# Patient Record
Sex: Female | Born: 1965
Health system: Southern US, Community
[De-identification: ages and names within clinical notes are randomized; demographics above are authoritative.]

## PROBLEM LIST (undated history)

## (undated) DIAGNOSIS — E669 Obesity, unspecified: Secondary | ICD-10-CM

## (undated) DIAGNOSIS — N809 Endometriosis, unspecified: Secondary | ICD-10-CM

## (undated) DIAGNOSIS — R040 Epistaxis: Secondary | ICD-10-CM

## (undated) DIAGNOSIS — D649 Anemia, unspecified: Secondary | ICD-10-CM

## (undated) DIAGNOSIS — J40 Bronchitis, not specified as acute or chronic: Secondary | ICD-10-CM

## (undated) DIAGNOSIS — Z86718 Personal history of other venous thrombosis and embolism: Secondary | ICD-10-CM

## (undated) DIAGNOSIS — M1990 Unspecified osteoarthritis, unspecified site: Secondary | ICD-10-CM

## (undated) DIAGNOSIS — D259 Leiomyoma of uterus, unspecified: Secondary | ICD-10-CM

## (undated) DIAGNOSIS — F32A Depression, unspecified: Secondary | ICD-10-CM

## (undated) DIAGNOSIS — I499 Cardiac arrhythmia, unspecified: Secondary | ICD-10-CM

## (undated) DIAGNOSIS — F329 Major depressive disorder, single episode, unspecified: Secondary | ICD-10-CM

## (undated) DIAGNOSIS — G4730 Sleep apnea, unspecified: Secondary | ICD-10-CM

## (undated) DIAGNOSIS — G4700 Insomnia, unspecified: Secondary | ICD-10-CM

## (undated) DIAGNOSIS — R600 Localized edema: Secondary | ICD-10-CM

## (undated) DIAGNOSIS — I4891 Unspecified atrial fibrillation: Secondary | ICD-10-CM

## (undated) DIAGNOSIS — E559 Vitamin D deficiency, unspecified: Secondary | ICD-10-CM

## (undated) DIAGNOSIS — K219 Gastro-esophageal reflux disease without esophagitis: Secondary | ICD-10-CM

## (undated) DIAGNOSIS — I10 Essential (primary) hypertension: Secondary | ICD-10-CM

## (undated) DIAGNOSIS — R0981 Nasal congestion: Secondary | ICD-10-CM

## (undated) DIAGNOSIS — F419 Anxiety disorder, unspecified: Secondary | ICD-10-CM

## (undated) HISTORY — DX: Anxiety disorder, unspecified: F41.9

## (undated) HISTORY — DX: Vitamin D deficiency, unspecified: E55.9

## (undated) HISTORY — DX: Insomnia, unspecified: G47.00

## (undated) HISTORY — DX: Localized edema: R60.0

## (undated) HISTORY — DX: Nasal congestion: R09.81

## (undated) HISTORY — PX: MOUTH SURGERY: SHX715

## (undated) HISTORY — DX: Personal history of other venous thrombosis and embolism: Z86.718

## (undated) HISTORY — DX: Depression, unspecified: F32.A

## (undated) HISTORY — PX: ABDOMINAL HYSTERECTOMY: SHX81

## (undated) HISTORY — DX: Endometriosis, unspecified: N80.9

## (undated) HISTORY — PX: BREAST BIOPSY: SHX20

## (undated) HISTORY — DX: Epistaxis: R04.0

## (undated) HISTORY — PX: LAPAROSCOPY: SHX197

## (undated) HISTORY — DX: Unspecified atrial fibrillation: I48.91

## (undated) HISTORY — DX: Sleep apnea, unspecified: G47.30

---
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
## 2005-05-27 ENCOUNTER — Ambulatory Visit: Payer: Self-pay | Admitting: Internal Medicine

## 2005-10-18 ENCOUNTER — Emergency Department (HOSPITAL_COMMUNITY): Admission: EM | Admit: 2005-10-18 | Discharge: 2005-10-19 | Payer: Self-pay | Admitting: Emergency Medicine

## 2005-10-21 ENCOUNTER — Encounter: Admission: RE | Admit: 2005-10-21 | Discharge: 2005-10-21 | Payer: Self-pay | Admitting: Internal Medicine

## 2005-11-19 ENCOUNTER — Other Ambulatory Visit: Admission: RE | Admit: 2005-11-19 | Discharge: 2005-11-19 | Payer: Self-pay | Admitting: Obstetrics & Gynecology

## 2005-11-27 ENCOUNTER — Ambulatory Visit (HOSPITAL_COMMUNITY): Admission: RE | Admit: 2005-11-27 | Discharge: 2005-11-27 | Payer: Self-pay | Admitting: Obstetrics & Gynecology

## 2005-12-10 ENCOUNTER — Other Ambulatory Visit: Admission: RE | Admit: 2005-12-10 | Discharge: 2005-12-10 | Payer: Self-pay | Admitting: Obstetrics & Gynecology

## 2005-12-20 ENCOUNTER — Inpatient Hospital Stay (HOSPITAL_COMMUNITY): Admission: AD | Admit: 2005-12-20 | Discharge: 2005-12-20 | Payer: Self-pay | Admitting: Obstetrics & Gynecology

## 2006-01-18 ENCOUNTER — Ambulatory Visit (HOSPITAL_BASED_OUTPATIENT_CLINIC_OR_DEPARTMENT_OTHER): Admission: RE | Admit: 2006-01-18 | Discharge: 2006-01-18 | Payer: Self-pay | Admitting: Obstetrics & Gynecology

## 2006-02-07 DIAGNOSIS — N8003 Adenomyosis of the uterus: Secondary | ICD-10-CM

## 2006-02-07 DIAGNOSIS — N800 Endometriosis of uterus: Secondary | ICD-10-CM

## 2006-02-07 HISTORY — DX: Adenomyosis of the uterus: N80.03

## 2006-02-07 HISTORY — DX: Endometriosis of uterus: N80.0

## 2006-03-02 ENCOUNTER — Encounter (INDEPENDENT_AMBULATORY_CARE_PROVIDER_SITE_OTHER): Payer: Self-pay | Admitting: *Deleted

## 2006-03-02 ENCOUNTER — Inpatient Hospital Stay (HOSPITAL_COMMUNITY): Admission: RE | Admit: 2006-03-02 | Discharge: 2006-03-04 | Payer: Self-pay | Admitting: Obstetrics & Gynecology

## 2006-04-01 ENCOUNTER — Encounter: Admission: RE | Admit: 2006-04-01 | Discharge: 2006-04-01 | Payer: Self-pay | Admitting: Internal Medicine

## 2006-10-21 ENCOUNTER — Ambulatory Visit: Payer: Self-pay | Admitting: Family Medicine

## 2006-10-28 ENCOUNTER — Ambulatory Visit: Payer: Self-pay | Admitting: Family Medicine

## 2006-11-18 ENCOUNTER — Ambulatory Visit: Payer: Self-pay | Admitting: Family Medicine

## 2007-01-04 ENCOUNTER — Encounter: Admission: RE | Admit: 2007-01-04 | Discharge: 2007-01-04 | Payer: Self-pay | Admitting: Internal Medicine

## 2007-01-07 ENCOUNTER — Ambulatory Visit (HOSPITAL_COMMUNITY): Admission: RE | Admit: 2007-01-07 | Discharge: 2007-01-07 | Payer: Self-pay | Admitting: Obstetrics & Gynecology

## 2008-02-02 ENCOUNTER — Ambulatory Visit (HOSPITAL_COMMUNITY): Admission: RE | Admit: 2008-02-02 | Discharge: 2008-02-02 | Payer: Self-pay | Admitting: Obstetrics & Gynecology

## 2008-08-10 HISTORY — PX: LAPAROSCOPIC GASTRIC BANDING: SHX1100

## 2008-11-27 ENCOUNTER — Ambulatory Visit (HOSPITAL_COMMUNITY): Admission: RE | Admit: 2008-11-27 | Discharge: 2008-11-27 | Payer: Self-pay | Admitting: Surgery

## 2008-12-06 ENCOUNTER — Encounter: Admission: RE | Admit: 2008-12-06 | Discharge: 2009-02-07 | Payer: Self-pay | Admitting: Surgery

## 2008-12-07 ENCOUNTER — Ambulatory Visit (HOSPITAL_COMMUNITY): Admission: RE | Admit: 2008-12-07 | Discharge: 2008-12-07 | Payer: Self-pay | Admitting: Surgery

## 2009-02-15 ENCOUNTER — Ambulatory Visit (HOSPITAL_COMMUNITY): Admission: RE | Admit: 2009-02-15 | Discharge: 2009-02-15 | Payer: Self-pay | Admitting: Obstetrics & Gynecology

## 2009-02-18 ENCOUNTER — Ambulatory Visit (HOSPITAL_COMMUNITY): Admission: RE | Admit: 2009-02-18 | Discharge: 2009-02-19 | Payer: Self-pay | Admitting: Surgery

## 2009-03-04 ENCOUNTER — Encounter: Admission: RE | Admit: 2009-03-04 | Discharge: 2009-06-02 | Payer: Self-pay | Admitting: Surgery

## 2009-08-10 HISTORY — PX: LAPAROSCOPIC GASTRIC BANDING: SHX1100

## 2009-09-11 ENCOUNTER — Encounter: Admission: RE | Admit: 2009-09-11 | Discharge: 2009-09-11 | Payer: Self-pay | Admitting: Surgery

## 2009-11-11 DIAGNOSIS — I10 Essential (primary) hypertension: Secondary | ICD-10-CM | POA: Insufficient documentation

## 2010-02-21 ENCOUNTER — Ambulatory Visit (HOSPITAL_COMMUNITY): Admission: RE | Admit: 2010-02-21 | Discharge: 2010-02-21 | Payer: Self-pay | Admitting: Obstetrics & Gynecology

## 2010-03-11 ENCOUNTER — Encounter
Admission: RE | Admit: 2010-03-11 | Discharge: 2010-05-09 | Payer: Self-pay | Source: Home / Self Care | Admitting: Surgery

## 2010-08-31 ENCOUNTER — Encounter: Payer: Self-pay | Admitting: Gastroenterology

## 2010-11-16 LAB — CBC
HCT: 36.9 % (ref 36.0–46.0)
Hemoglobin: 11.3 g/dL — ABNORMAL LOW (ref 12.0–15.0)
Hemoglobin: 12.5 g/dL (ref 12.0–15.0)
MCHC: 33.5 g/dL (ref 30.0–36.0)
MCV: 88.2 fL (ref 78.0–100.0)
MCV: 88.5 fL (ref 78.0–100.0)
Platelets: 297 10*3/uL (ref 150–400)
RDW: 13.6 % (ref 11.5–15.5)
WBC: 10.8 10*3/uL — ABNORMAL HIGH (ref 4.0–10.5)

## 2010-11-16 LAB — COMPREHENSIVE METABOLIC PANEL
ALT: 20 U/L (ref 0–35)
AST: 20 U/L (ref 0–37)
Albumin: 3.7 g/dL (ref 3.5–5.2)
Alkaline Phosphatase: 63 U/L (ref 39–117)
Calcium: 9 mg/dL (ref 8.4–10.5)
Chloride: 108 mEq/L (ref 96–112)
GFR calc Af Amer: >60 mL/min (ref >60)
Potassium: 3.9 mEq/L (ref 3.5–5.1)
Sodium: 140 mEq/L (ref 135–145)
Total Bilirubin: 0.7 mg/dL (ref 0.3–1.2)

## 2010-11-16 LAB — DIFFERENTIAL
Basophils Absolute: 0 10*3/uL (ref 0.0–0.1)
Eosinophils Absolute: 0 10*3/uL (ref 0.0–0.7)
Lymphocytes Relative: 13 % (ref 12–46)
Lymphs Abs: 1.4 10*3/uL (ref 0.7–4.0)
Neutro Abs: 3.3 10*3/uL (ref 1.7–7.7)
Neutro Abs: 8.8 10*3/uL — ABNORMAL HIGH (ref 1.7–7.7)
Neutrophils Relative %: 67 % (ref 43–77)
Neutrophils Relative %: 81 % — ABNORMAL HIGH (ref 43–77)

## 2010-12-23 NOTE — Op Note (Signed)
NAMEED, RAYSON NO.:  [id number]   MEDICAL RECORD NO.:  [id number]          PATIENT TYPE:  OIB   LOCATION:  1530                         FACILITY:  [facility]   PHYSICIAN:  Sandria Bales. Ezzard Standing, M.D.  DATE OF BIRTH:  09-28-1965   DATE OF PROCEDURE:  02/18/2009  DATE OF DISCHARGE:  02/15/2009                               OPERATIVE REPORT   Date of Surgery - July 2010   PREOPERATIVE DIAGNOSES:  Morbid obesity, weight 312 pounds, body mass  index 46.2.   POSTOPERATIVE DIAGNOSES:  Morbid obesity (weight 312, body mass index  46.2).   PROCEDURE:  Laparoscopic band with AP standard band, subcutaneous port  placement.   INDICATIONS FOR PROCEDURE:  Ms. Biel is a 45 year old black female who  has completed our preop bariatric program and is interested in a lap  band placement.  She actually saw Dr. Daphine Deutscher in the preop visit because  I was out of town, but she understands well the indications and  potential complications of lap band surgery.   The potential complications include, but are not limited to bleeding,  infection, bowel injury, slippage or erosion of the band and long-term  nutritional consequences.   DESCRIPTION OF PROCEDURE:  The patient placed in a supine position and  her abdomen was prepped with ChloraPrep and sterilely draped.  She was  given a gram of Ancef at the initiation of the procedure and had PAS  stockings in place.  A timeout was held identifying the patient and the  procedure.   The abdomen was actually seated left upper quadrant.  I placed a 12 mm  Optiview to get into the abdomen.  I then placed four additional  trocars, a 5 mm subxiphoid trocar, a 15 mm right subcostal, an 11 mm  right paramedian and a 11 mm left paramedian.  In entering the abdomen,  I did go through some omentum.  I re-inspected this.  There was omentum  stuck up along her left upper quadrant.  I took some of this down and  inspected it.  I saw no obvious bowel  injury or any evidence of  bleeding.  I then proceeded with the operation.  I placed a Nathanson  retractor under the left lobe of the liver, retracted and found actually  a fairly thin upper abdomen.   Anesthesia passed the sizing tube, blew it up with 15 mL and checked for  a hiatal hernia and there was none.   I then dissected and opened up a window behind the gastrohepatic  ligament, identifying the left crus and opened it.  I opened a window  along the right crus and passed the finger dissector in a retrogastric  position.  I used an AP standard band that I placed into the abdomen and  then pulled this around the esophagogastric junction and cinched it down  over the sizing tube.   After pulling the band around the gastroesophageal junction, I cinched  it down over the sizing tube and removed the sizing tube.  I then used  three imbricating sutures of 0 Ethibond  suture bringing the stomach over  the band to do a gastrogastric plication.   I did take photos of the band which looked in good position without any  bleeding or problems.   I brought out the sizing tubing through the right paramedian incision  and attached it to a port.  I removed the Nathanson retractor under  direct visualization.  I re-inspected the left upper quadrant where the  bleeding had started at the beginning of the case and there was no  further.  I removed the trocars in turn.   I created a pocket where I placed the port subcutaneously in the right  upper quadrant mediolateral to incision.  I tried to make sure this lay  as flat as possible.  I closed the subcutaneous tissues with 3-0 Vicryl  suture.  I closed the skin with a 5-0 Vicryl suture, painted the wounds  with a tincture of benzoin and Steri-Stripped it.   The patient tolerated the procedure well and was transported to the  recovery room in good condition.  I did use about 20 mL of quarter-  percent Marcaine.  At the end of the procedure,  sponge and needle counts  were correct.      Sandria Bales. Ezzard Standing, M.D.  Electronically Signed     DHN/MEDQ  D:  02/18/2009  T:  02/19/2009  Job:  [id number]   cc:   Maryelizabeth Rowan, M.D.  Fax: [phone]   M. Leda Quail, MD  Fax: [phone]

## 2010-12-26 NOTE — Discharge Summary (Signed)
NAMELUANNE, KRZYZANOWSKI NO.:  [id number]   MEDICAL RECORD NO.:  [id number]          PATIENT TYPE:  INP   LOCATION:  1618                         FACILITY:  [facility]   PHYSICIAN:  M. Leda Quail, MD  DATE OF BIRTH:  02-07-1966   DATE OF ADMISSION:  03/02/2006  DATE OF DISCHARGE:  03/04/2006                                 DISCHARGE SUMMARY   ADMISSION DIAGNOSES:  2. A 45 year old G-1, P-1, African-American female with menorrhagia.  2. Iron deficiency anemia.  3. Failed HTA.  4. Uterine fibroids.   DISCHARGE DIAGNOSES:  43. A 45 year old G-1, P-1, African-American female with menorrhagia.  2. Iron deficiency anemia.  3. Failed HTA (hydrothermal ablation).  4. Uterine fibroids.   PROCEDURE:  Total abdominal hysterectomy.   HISTORY OF PRESENT ILLNESS:  There is a written H&P in the chart but brief,  Mrs. Cuthbert is a 45 year old, G-1, P-1, African-American female, who has a  history of menorrhagia and iron deficiency anemia.  She had C-section for  delivery of her first child.  She was seen as a new patient earlier this  year.  We discussed her bleeding.  Workup included an ultrasound, which did  show some uterine fibroids.  She does have regular cycles, they are just  very heavy and clotty.  Initially we decided to proceed with a hydrothermal  ablation for treatment but during the procedure poor distention of the  uterine cavity was achieved and procedure was terminated because I did not  feel that she would get a good ablation.  At that time, we talked about  using progesterone only pills or Mirena IUD versus definitive treatment with  hysterectomy and she opted for definitive treatment.  The rest of H&P is in  her chart.   HOSPITAL COURSE:  The patient was admitted through Same-Day Surgery and  taken to the operating room, where a total abdominal hysterectomy was  performed.  She had approximately 350 mL of blood loss.  She made 150 mL of  clear urine during  the procedure.  The patient did well during the procedure  and was taken to the recovery room and from there to the 6th floor for  postop recovery.  The evening of postoperative day 0, she was having some  nausea but she had good pain control.  Vital signs were stable.  She was  afebrile with blood pressure slightly elevated, as she did not take her  blood pressure medicine that day according to Anesthesia because it had a  diuretic in it.  I did draw a postop hemoglobin in the evening of  postoperative day 0 because her preop was 10.7.  This was 9.8.  she did have  some bowel sounds and the dressing over the incision was dry.  The morning  of postoperative day 1, she was complaining that the morphine PCA was making  her nauseated although otherwise she was doing well.  T-max was 98.1.  Vital  signs were stable.  Blood pressures ranged from 130 to 154/74 to 103.  She  made 2200 mL of urine output.  Repeat postop hemoglobin was 9.4 and her  electrolytes were within normal limits.  She had good bowel sounds.  The  incision was clean, dry and intact.  Perineum was dry and her lower  extremities were without any edema.  She did have SCDs in place.  Her Foley  catheter was removed.  Diet was advanced to regular diet.  She was  encouraged to ambulate.  Once she was tolerating regular diet well,  her IV  was Hep-locked.  The PCA was discontinued.  All IV medications were  discontinued.  The evening of postoperative day 1, she tolerated the diet  well and was voiding well.  She was passing flatus.  She also was tolerating  her pain medicines by mouth.  By postoperative day 2, she was doing well.  She was having some gas pains but passing flatus, otherwise vital signs were  stable.  She was afebrile.  Her blood pressures were much better control as  she had started back on her Benicar.  Her physical exam was completely  benign.  I removed her On-Q pump which was used for local anesthesia in the   immediate postoperative period.  The On-Q tubing sites had Steri-Strips  placed.  She did very well through her hospital course and at this point  discharge is felt appropriate.  She will be discharged home with family  later today.   DISCHARGE INSTRUCTIONS:  Provided in the written and verbal form.  She has  postop appointment with me on July 31 at 2:15 in the afternoon to check her  incision.  She was given her postoperative pain medication prescriptions at  her preop visit and this includes Percocet and Motrin and she will continue  on her 3 other preoperative medications, which include Aciphex, Zoloft and  Benicar.      Lum Keas, MD  Electronically Signed     MSM/MEDQ  D:  03/04/2006  T:  03/04/2006  Job:  [id number]

## 2010-12-26 NOTE — Op Note (Signed)
[name], [name]                ACCOUNT NO.:  [id number]   MEDICAL RECORD NO.:  [id number]          PATIENT TYPE:  AMB   LOCATION:  DAY                          FACILITY:  [facility]   PHYSICIAN:  M. Leda Quail, MD  DATE OF BIRTH:  1966/01/17   DATE OF PROCEDURE:  03/02/2006  DATE OF DISCHARGE:                                 OPERATIVE REPORT   PREOPERATIVE DIAGNOSES:  68. 45 year old G1, P81 African-American female with menorrhagia.  2. Fibroids.  3. Iron-deficiency anemia.  4. Hypertension.  5. History of cesarean section x1.  6. Reflux disease.  7. Obesity.   POSTOPERATIVE DIAGNOSES:  54. 45 year old G1, P79 African-American female with menorrhagia.  2. Fibroids.  3. Iron-deficiency anemia.  4. Hypertension.  5. History of cesarean section x1.  6. Reflux disease.  7. Obesity.   PROCEDURE:  Total abdominal hysterectomy.   SURGEON:  M. Leda Quail, MD   ASSISTANT:  Edwena Felty. Romine, M.D.   ANESTHESIA:  General endotracheal.   SPECIMENS:  Uterus and cervix to pathology.   ESTIMATED BLOOD LOSS:  350 cc.   URINE OUTPUT:  150 cc of clear urine draining in the Foley catheter.   FLUIDS:  4000 cc of LR and 500 cc of Hespan.   COMPLICATIONS:  None.   INDICATIONS:  [name] is a 45 year old G1, P69 African-American female  with a history of iron-deficiency anemia resulting from menorrhagia.  She  does have fibroids on ultrasound, and she has had a negative endometrial  biopsy.  Patient opted for an HTA earlier this year for treatment of her  menorrhagia; however, during that surgery, she was noted to have a very  boggy and heavy uterus, and it was very difficult to maintain adequate  distention, despite having a good seal around the HTA apparatus.  Her HTA  was aborted, as I did not feel she would get an appropriate treatment with  that outpatient procedure.  She and I discussed other options, which could  include progesterone-only pills, continuous progestin, a  Mirena IUD, but the  patient opted for a hysterectomy.  She presents for this today.   PROCEDURE:  Patient taken to the operating room.  She was placed in the  supine position.  General endotracheal anesthesia administered by the  anesthesiologist without difficulty.  The legs were positioned in the frog  leg position.  The abdomen, perineum, inner thighs, and vagina were prepped  in a normal sterile fashion.  A Foley catheter was inserted under sterile  conditions, and the patient was draped after her legs were straightened.   Using a knife, a Pfannenstiel skin incision was made and carried down  through the subcutaneous fat and tissue.  The fascia was identified and  extended midline.  The fascial incision was extended laterally using curved  Mayo scissors.   Kocher clamps were applied to the inferior aspect of fascia incision.  Underlying rectus muscles were dissected off sharply.  This was done  superiorly and inferiorly.  Then the rectus muscles were divided sharply.  The Peritoneum was identified and incised sharply.  The peritoneal  incision  was extended superiorly and then inferiorly with caution, noting the  location of the bladder.  At this point, the pelvis was surveyed.  No  abnormalities were noted.  There was a retroflexed uterus.  The ovaries and  tubes appeared normal.  The liver edge, gallbladder, and stomach edge felt  normal.   A Balfour retractor was placed intra-abdominally.  The bladder blade was  detached.  Care was taken to make sure that the bowel was not under the  blades of the Balfour retractor and that the Balfour retractor was not  sitting on the psoas muscles.  Then the bowel was packed anteriorly with  three moistened laparotomy sponges.  An upper blade was then attached to the  Balfour retractor and a malleable retractor was attached, just to insure  that the bowel packing stayed superiorly during the procedure.   Long Kelly clamps were placed along  the uterine ovarian pedicles.  Round  ligaments were suture-ligated bilaterally and incised.  The anterior leaf of  the broad ligament was opened to the level of the internal os.  This was  done bilaterally.  Then the utero-ovarian arteries were isolated  bilaterally.  Clamped and curved Heaney clamps transected and suture-ligated  first with free tie of 0 Vicryl and then a stitch tie.  These pedicles were  hemostatic.  The uterine arteries were isolated bilaterally and clamped with  curved Heaney clamps.  The pedicles were transected and tied twice with 0  Vicryl.  Then attention was turned to the bladder flap.  The bladder was  dissected off of the cervix.  There was scar tissue and some vascularity  where the previous C-section scar was.  Once this was incised sharply, the  bladder could be advanced down the cervix without difficulty using sponge  stick.  At this point, the cardinal ligaments were serially transected with  clamps, transected, and suture ligated with 0 Vicryl.  This was done down  the side of the cervix.  Care was taken to insure that the bladder was below  the level of the pedicle.  Then at the base of the cervix, curved Heaney  clamps were placed at the corner.  The pedicle was transected, and a Heaney  stitch of 0 Vicryl was placed.  This was left long and held onto.  Then  using Jorgenson scissors, the vaginal mucosa was incised circumferentially  around the cervix, and the cervix was freed from the vagina.  The cervix and  uterus were handed off to be sent to pathology.  The vaginal cuff angle was  stitched with a 0 Vicryl and transfixed to the ipsilateral uterosacral  ligament.  The remainder of the cuff was closed with figure-of-eight sutures  of 0 Vicryl.   At this point, the pelvis was irrigated with copious amounts of warm normal  saline.  Round ligaments were hemostatic.  The uterine ovarian pedicles were hemostatic; however, there was some bleeding on the  bladder.  Two  superficial stitches of 2-0 Vicryl were used to obtain hemostasis.  Because  there was a significant amount of vascularity present as the bladder flap  was being created, Gelfoam was placed underneath the bladder and on top of  the vaginal cuff.  The bladder dissection was at least another 1 cm down the  cervix from the vaginal cuff.  There was also some bleeding at the  peritoneal edge along the posterior aspect of the vaginal cuff.  This was  suture ligated  with 0 Vicryl and stitched to the vaginal cuff.  Hemostasis  was noted here.  At this point, hemostasis was present.  The bladder blade,  upper bowel blade, as well as the malleable retractor and the three moist  laparotomy sponges were removed.  Then the Balfour retractor was removed  without difficulty.  The bowel and omentum were placed back in normal  anatomic position.  The peritoneum was closed with a running stitch of 0  Vicryl.  Then the OnCue pump on the patient's right side was placed  subfascially.  It had been primed with 1% lidocaine.  The subfascial tissue  and rectus muscles were visualized.  No bleeding was noted.  The fascia was  then closed from the corners and run to the midline.  The second  subcutaneous OnCue pump tubing was placed on the patient's left side.  Subcutaneous fat and tissues were irrigated.  Excellent hemostasis was  noted.  Then the skin was closed with a subcuticular stitch of 3-0 Vicryl.  The incision was cleansed with Benzoin.  Steri-Strips were applied.  The  OnCue pump tubing was held in place using Steri-Strips, then dressing was  applied.   Sponge, lap, needle, and instrument counts were correct x2.  The patient  tolerated the procedure well.  She is awakened from general endotracheal  anesthesia without difficulty and taken to the recovery room in stable  condition.      Lum Keas, MD  Electronically Signed     MSM/MEDQ  D:  03/02/2006  T:  03/02/2006  Job:   [id number]

## 2010-12-26 NOTE — Op Note (Signed)
[name], [name]                ACCOUNT NO.:  [id number]   MEDICAL RECORD NO.:  [id number]          PATIENT TYPE:  AMB   LOCATION:  NESC                         FACILITY:  [facility]   PHYSICIAN:  M. Leda Quail, MD  DATE OF BIRTH:  1966-03-26   DATE OF PROCEDURE:  01/18/2006  DATE OF DISCHARGE:                                 OPERATIVE REPORT   PREOPERATIVE DIAGNOSIS:  A 45 year old G2, P1-A1, African-American female  with menorrhagia and uterine fibroids.   POSTOPERATIVE DIAGNOSIS:  A 45 year old G72, P79-A1, African-American female  with menorrhagia and uterine fibroids.   OPERATION PERFORMED:  Hysteroscopy.   SURGEON:  M. Leda Quail, MD   ASSISTANT:  None.   ANESTHESIA:  General endotracheal.  Dr. Leta Jungling, Anesthesiologist.   URINE OUTPUT:  50 mL.   DRAINS:  Bonanno catheter at end of procedure.   FLUIDS REPLACED:  1500 mL lactated Ringers.   ESTIMATED BLOOD LOSS:  Minimal.   SPECIMENS:  None.   INDICATIONS FOR PROCEDURE:  [name] is a 45 year old G2, P1-A1 African-  American female with history of menorrhagia.  Uterine fibroids have been  diagnosed with ultrasound.  Uterus is not grossly enlarged.  It measured 7.8  x 4.4 x 5.7 cm and had several small fibroids measuring out 1.4 cm and  approximately 1 cm in size.  she does have fairly retroflexed uterus.  An  endometrial biopsy was done preop which showed proliferative endometrium.  She was also pretreated with Depo-Lupron to improve the outcome of surgery.   COMPLICATIONS:  Due to poor distention of the endometrial cavity with the  hysteroscopy, the fundus of the cavity could not be well-distended and I did  not feel that it was appropriate to proceed with the ablation, as I do not  feel that she would have a good result from the surgery.  As the tubal  occlusion was also being done due to the HTA, this was not attempted as  well.   DESCRIPTION OF PROCEDURE:  The patient was taken to the operating room.   She  was placed in supine position.  Running IV was in place.  Informed consent  ws present on the chart.  General endotracheal anesthesia was administered  by anesthesia staff without difficulty.  Her legs were positioned in the  dorsal lithotomy position in [place] stirrups.  The abdomen and perineum were  entered.  Thighs and vagina were prepped and draped in the normal sterile  fashion.  A weighted speculum was placed in the vagina.  Cervix was  visualized.  Anterior lip of the cervix was grasped using a single toothed  tenaculum.  The uterus sounded to 9 mm which was inconsistent with uterus  measurements.  On ultrasound it appeared to be only about 7.8 cm.  Then  using Pratt dilators, the uterus was dilated up using Pratt dilators  starting with a #11 and dilating up to a #24.  Then a 7 mm hysteroscope was  placed through the endocervical canal and advanced toward the fundus.  The  uterus was retroflexed and a significant amount of pull  was placed on the  cervix to place the hysteroscope into the endometrial cavity.  The patient's  left tubal ostia was visualized.  There did appear to be a filmy covering  over it.  The right tubal ostia was not ever clearly identified.  The fundus  of the uterus kept collapsing and the right corner of the uterus was  visualized but not the ostia.  At this point the decision was made not to  proceed with the Assure and to go ahead and proceed with the HTA.  As the  hysteroscope was removed to the level right past the internal os where the  scope would sit for the endometrial ablation.  Fundus of the uterus  continued to collapse even with pressure applied to the distention medial  fluid.  Good distention of the fundus was not present and the fundus  continued to collapse on itself.  There was no deficit with the fluid at  this point and there was no evidence of a uterine perforation other than the  endometrial cavity sounded longer than it measured on  ultrasound.  I did not  feel comfortable proceeding with the ablation with the fundus continuing  upon itself and at this point the entire procedure was aborted.  The  hysteroscope was removed and the single toothed tenaculum was removed from  the cervix.  There was some bleeding noted on the anterior lip of the  cervix.  A silver nitrate stick was used to obtain excellent hemostasis.  The patient ultimately tolerated the procedure well.  Sponge, lap, needle  and instruments were correct times two.  Her legs were positioned back in  the supine position and she was awakened from general endotracheal  anesthesia without difficulty.      Lum Keas, MD  Electronically Signed     MSM/MEDQ  D:  01/18/2006  T:  01/18/2006  Job:  [id number]

## 2011-01-26 ENCOUNTER — Other Ambulatory Visit: Payer: Self-pay | Admitting: Obstetrics & Gynecology

## 2011-01-26 DIAGNOSIS — Z1231 Encounter for screening mammogram for malignant neoplasm of breast: Secondary | ICD-10-CM

## 2011-01-27 ENCOUNTER — Encounter (INDEPENDENT_AMBULATORY_CARE_PROVIDER_SITE_OTHER): Payer: Self-pay | Admitting: Surgery

## 2011-02-23 ENCOUNTER — Ambulatory Visit (HOSPITAL_COMMUNITY)
Admission: RE | Admit: 2011-02-23 | Discharge: 2011-02-23 | Disposition: A | Discharge status: Home / Self Care | Payer: BC Managed Care – PPO | Source: Ambulatory Visit | Attending: Obstetrics & Gynecology | Admitting: Obstetrics & Gynecology

## 2011-02-23 DIAGNOSIS — Z1231 Encounter for screening mammogram for malignant neoplasm of breast: Secondary | ICD-10-CM | POA: Insufficient documentation

## 2011-07-27 ENCOUNTER — Encounter (INDEPENDENT_AMBULATORY_CARE_PROVIDER_SITE_OTHER): Payer: Self-pay | Admitting: Surgery

## 2011-09-03 DIAGNOSIS — F419 Anxiety disorder, unspecified: Secondary | ICD-10-CM | POA: Insufficient documentation

## 2011-09-14 ENCOUNTER — Encounter (HOSPITAL_COMMUNITY): Payer: Self-pay | Admitting: *Deleted

## 2011-09-14 ENCOUNTER — Emergency Department (HOSPITAL_COMMUNITY)
Admission: EM | Admit: 2011-09-14 | Discharge: 2011-09-14 | Disposition: A | Discharge status: Home / Self Care | Payer: BC Managed Care – PPO | Attending: Emergency Medicine | Admitting: Emergency Medicine

## 2011-09-14 ENCOUNTER — Emergency Department (HOSPITAL_COMMUNITY): Payer: BC Managed Care – PPO

## 2011-09-14 DIAGNOSIS — R1084 Generalized abdominal pain: Secondary | ICD-10-CM | POA: Insufficient documentation

## 2011-09-14 DIAGNOSIS — Z79899 Other long term (current) drug therapy: Secondary | ICD-10-CM | POA: Insufficient documentation

## 2011-09-14 DIAGNOSIS — R112 Nausea with vomiting, unspecified: Secondary | ICD-10-CM | POA: Insufficient documentation

## 2011-09-14 DIAGNOSIS — I10 Essential (primary) hypertension: Secondary | ICD-10-CM | POA: Insufficient documentation

## 2011-09-14 HISTORY — DX: Essential (primary) hypertension: I10

## 2011-09-14 HISTORY — DX: Anemia, unspecified: D64.9

## 2011-09-14 HISTORY — DX: Leiomyoma of uterus, unspecified: D25.9

## 2011-09-14 LAB — URINALYSIS, MICROSCOPIC ONLY
Ketones, ur: NEGATIVE mg/dL
Leukocytes, UA: NEGATIVE
Nitrite: NEGATIVE
Protein, ur: NEGATIVE mg/dL
Urobilinogen, UA: 0.2 mg/dL (ref 0.0–1.0)

## 2011-09-14 LAB — COMPREHENSIVE METABOLIC PANEL
ALT: 12 U/L (ref 0–35)
AST: 13 U/L (ref 0–37)
Albumin: 3.5 g/dL (ref 3.5–5.2)
CO2: 29 mEq/L (ref 19–32)
Calcium: 9.3 mg/dL (ref 8.4–10.5)
Creatinine, Ser: 0.83 mg/dL (ref 0.50–1.10)
Sodium: 137 mEq/L (ref 135–145)

## 2011-09-14 LAB — CBC
HCT: 36.9 % (ref 36.0–46.0)
MCHC: 33.3 g/dL (ref 30.0–36.0)
MCV: 88.9 fL (ref 78.0–100.0)
Platelets: 335 10*3/uL (ref 150–400)
RDW: 12.9 % (ref 11.5–15.5)
WBC: 5.9 10*3/uL (ref 4.0–10.5)

## 2011-09-14 LAB — DIFFERENTIAL
Basophils Absolute: 0.1 10*3/uL (ref 0.0–0.1)
Basophils Relative: 1 % (ref 0–1)
Eosinophils Relative: 1 % (ref 0–5)
Lymphocytes Relative: 22 % (ref 12–46)
Monocytes Absolute: 0.7 10*3/uL (ref 0.1–1.0)
Neutro Abs: 3.8 10*3/uL (ref 1.7–7.7)

## 2011-09-14 LAB — LIPASE, BLOOD: Lipase: 20 U/L (ref 11–59)

## 2011-09-14 MED ORDER — SODIUM CHLORIDE 0.9 % IV SOLN: INTRAVENOUS | Status: DC

## 2011-09-14 MED ORDER — SODIUM CHLORIDE 0.9 % IV SOLN
1000.0000 mL | INTRAVENOUS | Status: DC
  Administered 2011-09-14: 1000 mL via INTRAVENOUS

## 2011-09-14 MED ORDER — DICYCLOMINE HCL 10 MG/ML IM SOLN
20.0000 mg | Freq: Once | INTRAMUSCULAR | Status: AC
  Administered 2011-09-14: 20 mg via INTRAMUSCULAR
  Filled 2011-09-14: qty 2

## 2011-09-14 MED ORDER — PROMETHAZINE HCL 25 MG RE SUPP: 25.0000 mg | Freq: Four times a day (QID) | RECTAL | Status: AC | PRN

## 2011-09-14 MED ORDER — ONDANSETRON HCL 4 MG/2ML IJ SOLN
4.0000 mg | INTRAMUSCULAR | Status: DC | PRN
  Administered 2011-09-14: 4 mg via INTRAVENOUS
  Filled 2011-09-14: qty 2

## 2011-09-14 MED ORDER — FAMOTIDINE IN NACL 20-0.9 MG/50ML-% IV SOLN
20.0000 mg | Freq: Once | INTRAVENOUS | Status: AC
  Administered 2011-09-14: 20 mg via INTRAVENOUS
  Filled 2011-09-14: qty 50

## 2011-09-14 MED ORDER — ONDANSETRON HCL 4 MG/2ML IJ SOLN
4.0000 mg | Freq: Once | INTRAMUSCULAR | Status: AC
  Administered 2011-09-14: 4 mg via INTRAVENOUS
  Filled 2011-09-14: qty 2

## 2011-09-14 NOTE — ED Notes (Signed)
Pt vomitted x 1 and now c/o nausea. Dr. Clarene Duke notified. Awaiting further orders.

## 2011-09-14 NOTE — ED Notes (Signed)
Dr. McManus at bedside. 

## 2011-09-14 NOTE — ED Notes (Signed)
Pt provided saltine crackers and ginger ale per MD orders

## 2011-09-14 NOTE — ED Provider Notes (Signed)
History     CSN: [id number]  Arrival date & time 09/14/11  0706    Chief Complaint  Patient presents with  . Nausea    HPI Pt was seen at 0730.  Per pt, c/o gradual onset and persistence of multiple intermittent episodes of N/V since yesterday.  Has been assoc with generalized abd "cramping" pain.  Denies fevers, no rash, no back pain, no diarrhea, no CP/SOB, no cough, no black or blood in stools or emesis.    Past Medical History  Diagnosis Date  . Sinus congestion   . Bleeding from the nose   . Hypertension   . Anemia   . Uterine fibroid     Past Surgical History  Procedure Date  . Laparoscopic gastric banding   . Abdominal hysterectomy     partial    History  Substance Use Topics  . Smoking status: Never Smoker   . Smokeless tobacco: Not on file  . Alcohol Use: No   Review of Systems ROS: Statement: All systems negative except as marked or noted in the HPI; Constitutional: Negative for fever and chills. ; ; Eyes: Negative for eye pain, redness and discharge. ; ; ENMT: Negative for ear pain, hoarseness, nasal congestion, sinus pressure and sore throat. ; ; Cardiovascular: Negative for chest pain, palpitations, diaphoresis, dyspnea and peripheral edema. ; ; Respiratory: Negative for cough, wheezing and stridor. ; ; Gastrointestinal: +N/V, abd pain. Negative for diarrhea, blood in stool, hematemesis, jaundice and rectal bleeding. . ; ; Genitourinary: Negative for dysuria, flank pain and hematuria. ; ; Musculoskeletal: Negative for back pain and neck pain. Negative for swelling and trauma.; ; Skin: Negative for pruritus, rash, abrasions, blisters, bruising and skin lesion.; ; Neuro: Negative for headache, lightheadedness and neck stiffness. Negative for weakness, altered level of consciousness , altered mental status, extremity weakness, paresthesias, involuntary movement, seizure and syncope.     Allergies  Flagyl  Home Medications   Current Outpatient Rx  Name Route Sig  Dispense Refill  . ALPRAZOLAM 0.5 MG PO TABS Oral Take 0.5 mg by mouth 3 (three) times daily as needed.    Marland Kitchen ALUMINUM CHLORIDE 20 % EX SOLN Topical Apply 1 application topically daily.    Marland Kitchen AMLODIPINE BESYLATE 2.5 MG PO TABS Oral Take 2.5 mg by mouth daily.    . BUPROPION HCL 100 MG PO TABS Oral Take 100 mg by mouth 2 (two) times daily.    Marland Kitchen CETIRIZINE HCL 10 MG PO TABS Oral Take 10 mg by mouth daily.    Marland Kitchen VITAMIN D 1000 UNITS PO TABS Oral Take 1,000 Units by mouth daily.    Marland Kitchen FLUTICASONE PROPIONATE 50 MCG/ACT NA SUSP Nasal Place 2 sprays into the nose daily.    Marland Kitchen MONTELUKAST SODIUM 10 MG PO TABS Oral Take 10 mg by mouth at bedtime.      . ADULT MULTIVITAMIN W/MINERALS CH Oral Take 1 tablet by mouth daily.    Marland Kitchen OLMESARTAN MEDOXOMIL-HCTZ 40-25 MG PO TABS Oral Take 1 tablet by mouth daily.    Marland Kitchen VITAMIN B-1 PO Oral Take 1 capsule by mouth daily.        BP 138/80  Pulse 78  Temp(Src) 98.4 F (36.9 C) (Oral)  Resp 20  SpO2 100%  Physical Exam 0735: Physical examination:  Nursing notes reviewed; Vital signs and O2 SAT reviewed;  Constitutional: Well developed, Well nourished, Well hydrated, Uncomfortable appearing; Head:  Normocephalic, atraumatic; Eyes: EOMI, PERRL, No scleral icterus; ENMT: Mouth and pharynx normal,  Mucous membranes moist; Neck: Supple, Full range of motion, No lymphadenopathy; Cardiovascular: Regular rate and rhythm, No murmur, rub, or gallop; Respiratory: Breath sounds clear & equal bilaterally, No rales, rhonchi, wheezes, or rub, Normal respiratory effort/excursion; Chest: Nontender, Movement normal; Abdomen: Soft, Nontender, Nondistended, Normal bowel sounds; Extremities: Pulses normal, No tenderness, No edema, No calf edema or asymmetry.; Neuro: AA&Ox3, Major CN grossly intact.  No gross focal motor or sensory deficits in extremities.; Skin: Color normal, Warm, Dry, no rash.    ED Course  Procedures    MDM  MDM Reviewed: nursing note and vitals Interpretation: labs  and x-ray   Results for orders placed during the hospital encounter of 09/14/11  LIPASE, BLOOD      Component Value Range   Lipase 20  11 - 59 (U/L)  CBC      Component Value Range   WBC 5.9  4.0 - 10.5 (K/uL)   RBC 4.15  3.87 - 5.11 (MIL/uL)   Hemoglobin 12.3  12.0 - 15.0 (g/dL)   HCT 56.2  13.0 - 86.5 (%)   MCV 88.9  78.0 - 100.0 (fL)   MCH 29.6  26.0 - 34.0 (pg)   MCHC 33.3  30.0 - 36.0 (g/dL)   RDW 78.4  69.6 - 29.5 (%)   Platelets 335  150 - 400 (K/uL)  DIFFERENTIAL      Component Value Range   Neutrophils Relative 65  43 - 77 (%)   Neutro Abs 3.8  1.7 - 7.7 (K/uL)   Lymphocytes Relative 22  12 - 46 (%)   Lymphs Abs 1.3  0.7 - 4.0 (K/uL)   Monocytes Relative 12  3 - 12 (%)   Monocytes Absolute 0.7  0.1 - 1.0 (K/uL)   Eosinophils Relative 1  0 - 5 (%)   Eosinophils Absolute 0.1  0.0 - 0.7 (K/uL)   Basophils Relative 1  0 - 1 (%)   Basophils Absolute 0.1  0.0 - 0.1 (K/uL)  COMPREHENSIVE METABOLIC PANEL      Component Value Range   Sodium 137  135 - 145 (mEq/L)   Potassium 3.6  3.5 - 5.1 (mEq/L)   Chloride 102  96 - 112 (mEq/L)   CO2 29  19 - 32 (mEq/L)   Glucose, Bld 94  70 - 99 (mg/dL)   BUN 9  6 - 23 (mg/dL)   Creatinine, Ser 2.84  0.50 - 1.10 (mg/dL)   Calcium 9.3  8.4 - 13.2 (mg/dL)   Total Protein 6.8  6.0 - 8.3 (g/dL)   Albumin 3.5  3.5 - 5.2 (g/dL)   AST 13  0 - 37 (U/L)   ALT 12  0 - 35 (U/L)   Alkaline Phosphatase 63  39 - 117 (U/L)   Total Bilirubin 0.7  0.3 - 1.2 (mg/dL)   GFR calc non Af Amer 84 (*) >90 (mL/min)   GFR calc Af Amer >90  >90 (mL/min)    Dg Abd Acute W/chest 09/14/2011  *RADIOLOGY REPORT*  Clinical Data: Nausea and vomiting.  ACUTE ABDOMEN SERIES (ABDOMEN 2 VIEW & CHEST 1 VIEW)  Comparison: Abdominal films 02/19/2009.  Findings: The upright chest x-ray demonstrates no acute cardiopulmonary findings.  Two views of the abdomen demonstrate a normal bowel gas pattern. No findings for obstruction or perforation.  The lap band appears to be  normally positioned and oriented without complicating features.  The soft tissue shadows are maintained.  The bony structures are normal.  IMPRESSION:  1.  No acute  cardiopulmonary findings. 2.  No plain film findings for an acute abdominal process.  Original Report Authenticated By: P. Loralie Champagne, M.D.    11:50 AM:  Feels improved after meds.  Waiting for UA results.  Pt sitting up on stretcher, talking on cellphone and typing on computer.  Will trial PO.     1345:  Vomited after eating crackers.  Given another dose of IV zofran.  Able to tol PO fluids without vomiting.  Wants to go home now.  Dx testing d/w pt.  Questions answered.  Verb understanding, agreeable to d/c home with outpt f/u.    Naheem Mosco Allison Quarry, DO 09/16/11 1723

## 2011-09-14 NOTE — ED Notes (Signed)
Pt returned from XR to ER5.

## 2011-09-14 NOTE — ED Notes (Signed)
Pt reports taking 2 sips of ginger ale, no vomitting, but remains nauseated. Dr. Clarene Duke notified. Dr. Clarene Duke at bedside discussing plan of care with pt.

## 2011-09-14 NOTE — ED Notes (Signed)
Patient transported via stretcher to X-ray 

## 2011-09-14 NOTE — ED Notes (Signed)
Pt. Is unable to go the restroom at this time.

## 2011-09-14 NOTE — ED Notes (Signed)
Pt experiencing abdominal cramping and N/V x 1 day.  Pt states she has had no diarrhea and denies other symptoms.

## 2011-09-15 LAB — URINE CULTURE
Colony Count: 75000
Culture  Setup Time: 201302041422

## 2011-11-02 ENCOUNTER — Telehealth (INDEPENDENT_AMBULATORY_CARE_PROVIDER_SITE_OTHER): Payer: Self-pay | Admitting: General Surgery

## 2011-11-02 NOTE — Telephone Encounter (Signed)
Pt calling for appt.  Has lap band since July 2010 and states she has not been in to be seen in a long time.  No current problems or discomfort.  First available appt on template is in June.  She would like to get in sooner if possible.  Call her cell, please:  [phone].

## 2011-11-04 ENCOUNTER — Telehealth (INDEPENDENT_AMBULATORY_CARE_PROVIDER_SITE_OTHER): Payer: Self-pay

## 2011-11-04 NOTE — Telephone Encounter (Signed)
I returned the pt's call to try and schedule an appointment.  She was told that Mardelle Matte doesn't have any time until June.  I reviewed his and Dr Allene Pyo schedule and offered some time in April and May.  She is out of town a lot but we agreed on an appointment May 16th at 4:30pm.

## 2011-11-09 HISTORY — PX: NASAL SINUS SURGERY: SHX719

## 2011-11-11 DIAGNOSIS — Z9109 Other allergy status, other than to drugs and biological substances: Secondary | ICD-10-CM | POA: Insufficient documentation

## 2011-11-13 ENCOUNTER — Ambulatory Visit (INDEPENDENT_AMBULATORY_CARE_PROVIDER_SITE_OTHER): Payer: BC Managed Care – PPO | Admitting: Surgery

## 2011-11-13 ENCOUNTER — Encounter (INDEPENDENT_AMBULATORY_CARE_PROVIDER_SITE_OTHER): Payer: Self-pay | Admitting: Surgery

## 2011-11-13 VITALS — BP 126/80 | HR 92 | Temp 98.2°F | Resp 20 | Ht 69.0 in | Wt 253.0 lb

## 2011-11-13 DIAGNOSIS — Z9884 Bariatric surgery status: Secondary | ICD-10-CM

## 2011-11-13 NOTE — Progress Notes (Signed)
URGENT Office [name] 46 y.o.  Body mass index is 37.36 kg/(m^2).  Patient Active Problem List  Diagnoses  . Lapband APS July 2010    Allergies  Allergen Reactions  . Flagyl (Metronidazole Hcl) Swelling and Other (See Comments)    Hard to breath    Past Surgical History  Procedure Date  . Laparoscopic gastric banding 02/18/2009  . Abdominal hysterectomy     partial   DEWEY,ELIZABETH, MD, MD 1. Lapband APS July 2010     Ms. [name] hasn't been seen in our office is about a year.  In that time. She has gotten married and has travel a lot her work. Recently she has been having more issues with nocturnal reflux, coughing and wheezing. It sounds like she has developed some tightness issues. She has an appointment to see Dr. Ezzard Standing in a couple weeks. I elected to go ahead and remove 1.5 cc from her APS band asked her to followup with him. If that helped she may need to add a little bit back. So far she has lost about 70 pounds since her surgery. She looks good.  Plan followup with Dr. Ezzard Standing in the office at her pre-arranged schedule time. Matt B. Daphine Deutscher, MD, [facility] [phone] beeper [phone]  11/13/2011 5:45 PM

## 2011-11-13 NOTE — Progress Notes (Deleted)
URGENT Office SEFORA TIETJE 46 y.o.  Body mass index is 37.36 kg/(m^2).  There is no problem list on file for this patient.   Allergies  Allergen Reactions  . Flagyl (Metronidazole Hcl) Swelling and Other (See Comments)    Hard to breath    Past Surgical History  Procedure Date  . Laparoscopic gastric banding 02/18/2009  . Abdominal hysterectomy     partial   DEWEY,ELIZABETH, MD, MD No diagnosis found.  *** Matt B. Daphine Deutscher, MD, [facility] [phone] beeper [phone]  11/13/2011 5:43 PM

## 2011-11-14 ENCOUNTER — Emergency Department (HOSPITAL_COMMUNITY): Payer: BC Managed Care – PPO

## 2011-11-14 ENCOUNTER — Other Ambulatory Visit: Payer: Self-pay

## 2011-11-14 ENCOUNTER — Encounter (HOSPITAL_COMMUNITY): Payer: Self-pay | Admitting: Physical Medicine and Rehabilitation

## 2011-11-14 ENCOUNTER — Emergency Department (HOSPITAL_COMMUNITY)
Admission: EM | Admit: 2011-11-14 | Discharge: 2011-11-15 | Disposition: A | Discharge status: Home / Self Care | Payer: BC Managed Care – PPO | Attending: Emergency Medicine | Admitting: Emergency Medicine

## 2011-11-14 DIAGNOSIS — I10 Essential (primary) hypertension: Secondary | ICD-10-CM | POA: Insufficient documentation

## 2011-11-14 DIAGNOSIS — R0789 Other chest pain: Secondary | ICD-10-CM

## 2011-11-14 DIAGNOSIS — R071 Chest pain on breathing: Secondary | ICD-10-CM | POA: Insufficient documentation

## 2011-11-14 LAB — COMPREHENSIVE METABOLIC PANEL
Alkaline Phosphatase: 72 U/L (ref 39–117)
BUN: 10 mg/dL (ref 6–23)
Calcium: 10 mg/dL (ref 8.4–10.5)
Creatinine, Ser: 0.8 mg/dL (ref 0.50–1.10)
GFR calc Af Amer: >90 mL/min (ref >90)
Glucose, Bld: 93 mg/dL (ref 70–99)
Potassium: 3.4 mEq/L — ABNORMAL LOW (ref 3.5–5.1)
Total Protein: 7.1 g/dL (ref 6.0–8.3)

## 2011-11-14 LAB — D-DIMER, QUANTITATIVE: D-Dimer, Quant: 0.35 ug/mL-FEU (ref 0.00–0.48)

## 2011-11-14 LAB — DIFFERENTIAL
Basophils Relative: 1 % (ref 0–1)
Eosinophils Absolute: 0.1 10*3/uL (ref 0.0–0.7)
Eosinophils Relative: 1 % (ref 0–5)
Lymphs Abs: 2.7 10*3/uL (ref 0.7–4.0)
Monocytes Absolute: 0.7 10*3/uL (ref 0.1–1.0)
Monocytes Relative: 8 % (ref 3–12)

## 2011-11-14 LAB — POCT I-STAT TROPONIN I: Troponin i, poc: 0 ng/mL (ref 0.00–0.08)

## 2011-11-14 LAB — CBC
HCT: 39.1 % (ref 36.0–46.0)
Hemoglobin: 13.6 g/dL (ref 12.0–15.0)
MCH: 30.4 pg (ref 26.0–34.0)
MCHC: 34.8 g/dL (ref 30.0–36.0)
MCV: 87.3 fL (ref 78.0–100.0)
RBC: 4.48 MIL/uL (ref 3.87–5.11)

## 2011-11-14 MED ORDER — ALBUTEROL SULFATE (5 MG/ML) 0.5% IN NEBU
2.5000 mg | INHALATION_SOLUTION | Freq: Once | RESPIRATORY_TRACT | Status: AC
  Administered 2011-11-14: 2.5 mg via RESPIRATORY_TRACT
  Filled 2011-11-14: qty 0.5

## 2011-11-14 MED ORDER — IPRATROPIUM BROMIDE 0.02 % IN SOLN
0.5000 mg | Freq: Once | RESPIRATORY_TRACT | Status: AC
  Administered 2011-11-14: 0.5 mg via RESPIRATORY_TRACT
  Filled 2011-11-14: qty 2.5

## 2011-11-14 MED ORDER — KETOROLAC TROMETHAMINE 30 MG/ML IJ SOLN
30.0000 mg | Freq: Once | INTRAMUSCULAR | Status: AC
  Administered 2011-11-14: 30 mg via INTRAVENOUS
  Filled 2011-11-14: qty 1

## 2011-11-14 MED ORDER — NITROGLYCERIN 0.4 MG SL SUBL
0.4000 mg | SUBLINGUAL_TABLET | SUBLINGUAL | Status: DC | PRN
  Administered 2011-11-14 (×3): 0.4 mg via SUBLINGUAL

## 2011-11-14 MED ORDER — MORPHINE SULFATE 4 MG/ML IJ SOLN
4.0000 mg | Freq: Once | INTRAMUSCULAR | Status: AC
  Administered 2011-11-14: 4 mg via INTRAVENOUS
  Filled 2011-11-14: qty 1

## 2011-11-14 MED ORDER — ASPIRIN 81 MG PO CHEW
324.0000 mg | CHEWABLE_TABLET | Freq: Once | ORAL | Status: AC
  Administered 2011-11-14: 324 mg via ORAL
  Filled 2011-11-14: qty 4

## 2011-11-14 MED ORDER — ONDANSETRON HCL 4 MG/2ML IJ SOLN
INTRAMUSCULAR | Status: AC
  Administered 2011-11-14: 4 mg
  Filled 2011-11-14: qty 2

## 2011-11-14 NOTE — ED Notes (Signed)
MD notified of no change in pain.  No new orders per MD at this time.

## 2011-11-14 NOTE — ED Notes (Signed)
Pt presents to department for evaluation of midsternal non radiating chest pain. Onset this morning. 10/10 chest pressure at the time. Respirations unlabored. Pt is tearful and unable to sit still. Skin warm and dry, She is alert and oriented x4.

## 2011-11-14 NOTE — ED Notes (Signed)
Patient stating she is starting to get nauseated.  MD notified.  New orders per Dr Preston Fleeting.

## 2011-11-14 NOTE — ED Notes (Signed)
Family at bedside. 

## 2011-11-14 NOTE — ED Notes (Signed)
PT state pain in chest worsening. Family

## 2011-11-14 NOTE — ED Provider Notes (Signed)
History     CSN: [id number]  Arrival date & time 11/14/11  1850   First MD Initiated Contact with Patient 11/14/11 1927      Chief Complaint  Patient presents with  . Chest Pain    (Consider location/radiation/quality/duration/timing/severity/associated sxs/prior treatment) Patient is a 46 y.o. female presenting with chest pain. The history is provided by the patient.  Chest Pain   She had onset this morning at about 7:30 AM of a mid chest pressure and tight feeling. It was severe and she rates it at 10/10. During the day, it has waxed and waned. There has been mild associated dyspnea and nausea but no vomiting and no diaphoresis. She's had a nonproductive cough for the last several weeks. She has been having some dyspnea over that same time frame. Last night, she was having trouble breathing and got improvement with 2 albuterol nebulizer treatments. She's not taken any nebulizer treatments today. Nothing makes her pain better nothing makes it worse. It doesn't seem to matter whether she is walking, sitting, or lying down. Pain is not affected by deep breathing. She is a nonsmoker, does have history of hypertension.  Past Medical History  Diagnosis Date  . Sinus congestion   . Bleeding from the nose   . Hypertension   . Anemia   . Uterine fibroid     Past Surgical History  Procedure Date  . Laparoscopic gastric banding 02/18/2009  . Abdominal hysterectomy     partial    No family history on file.  History  Substance Use Topics  . Smoking status: Never Smoker   . Smokeless tobacco: Not on file  . Alcohol Use: No    OB History    Grav Para Term Preterm Abortions TAB SAB Ect Mult Living                  Review of Systems  Cardiovascular: Positive for chest pain.  All other systems reviewed and are negative.    Allergies  Flagyl  Home Medications   Current Outpatient Rx  Name Route Sig Dispense Refill  . ALPRAZOLAM 0.5 MG PO TABS Oral Take 0.5 mg by mouth 3  (three) times daily as needed. For anxiety    . ALUMINUM CHLORIDE 20 % EX SOLN Topical Apply 1 application topically daily.    Marland Kitchen AMLODIPINE BESYLATE 2.5 MG PO TABS Oral Take 2.5 mg by mouth daily.    . BUPROPION HCL 100 MG PO TABS Oral Take 100 mg by mouth 2 (two) times daily.    Marland Kitchen CETIRIZINE HCL 10 MG PO TABS Oral Take 10 mg by mouth daily.    Marland Kitchen MONTELUKAST SODIUM 10 MG PO TABS Oral Take 10 mg by mouth at bedtime.      . ADULT MULTIVITAMIN W/MINERALS CH Oral Take 1 tablet by mouth daily.    Marland Kitchen OLMESARTAN MEDOXOMIL-HCTZ 40-25 MG PO TABS Oral Take 1 tablet by mouth daily.    Marland Kitchen VITAMIN B-1 100 MG PO TABS Oral Take 100 mg by mouth daily.      BP 148/95  Pulse 84  Temp(Src) 98.4 F (36.9 C) (Oral)  Resp 24  SpO2 98%  Physical Exam  Nursing note and vitals reviewed.  46 year old female who appears uncomfortable. Vital signs are significant for hypertension with blood pressure 140/95, and tachypnea with respiratory rate of 24. Oxygen saturation is 98% which is normal. Head is normocephalic and atraumatic. PERRLA, EOMI. Oropharynx is clear. Neck is nontender and supple without adenopathy or  JVD. Lungs are clear without rales, wheezes, or rhonchi. There is a prolonged exhalation phase. Heart has regular rate and rhythm without murmur. There is mild chest wall tenderness which does not reproduce her pain. Abdomen is soft, flat, nontender without masses or hepatosplenomegaly. Extremities have no cyanosis or edema, full range of motion is present. Skin is warm and dry without rash. Neurologic: Mental status is normal, cranial nerves are intact, there are no focal motor or sensory deficits.  ED Course  Procedures (including critical care time)  Results for orders placed during the hospital encounter of 11/14/11  CBC      Component Value Range   WBC 8.1  4.0 - 10.5 (K/uL)   RBC 4.48  3.87 - 5.11 (MIL/uL)   Hemoglobin 13.6  12.0 - 15.0 (g/dL)   HCT 40.9  81.1 - 91.4 (%)   MCV 87.3  78.0 - 100.0 (fL)    MCH 30.4  26.0 - 34.0 (pg)   MCHC 34.8  30.0 - 36.0 (g/dL)   RDW 78.2  95.6 - 21.3 (%)   Platelets 347  150 - 400 (K/uL)  DIFFERENTIAL      Component Value Range   Neutrophils Relative 58  43 - 77 (%)   Neutro Abs 4.7  1.7 - 7.7 (K/uL)   Lymphocytes Relative 33  12 - 46 (%)   Lymphs Abs 2.7  0.7 - 4.0 (K/uL)   Monocytes Relative 8  3 - 12 (%)   Monocytes Absolute 0.7  0.1 - 1.0 (K/uL)   Eosinophils Relative 1  0 - 5 (%)   Eosinophils Absolute 0.1  0.0 - 0.7 (K/uL)   Basophils Relative 1  0 - 1 (%)   Basophils Absolute 0.1  0.0 - 0.1 (K/uL)  COMPREHENSIVE METABOLIC PANEL      Component Value Range   Sodium 135  135 - 145 (mEq/L)   Potassium 3.4 (*) 3.5 - 5.1 (mEq/L)   Chloride 98  96 - 112 (mEq/L)   CO2 26  19 - 32 (mEq/L)   Glucose, Bld 93  70 - 99 (mg/dL)   BUN 10  6 - 23 (mg/dL)   Creatinine, Ser 0.86  0.50 - 1.10 (mg/dL)   Calcium 57.8  8.4 - 10.5 (mg/dL)   Total Protein 7.1  6.0 - 8.3 (g/dL)   Albumin 3.7  3.5 - 5.2 (g/dL)   AST 15  0 - 37 (U/L)   ALT 12  0 - 35 (U/L)   Alkaline Phosphatase 72  39 - 117 (U/L)   Total Bilirubin 0.5  0.3 - 1.2 (mg/dL)   GFR calc non Af Amer 88 (*) >90 (mL/min)   GFR calc Af Amer >90  >90 (mL/min)  POCT I-STAT TROPONIN I      Component Value Range   Troponin i, poc 0.00  0.00 - 0.08 (ng/mL)   Comment 3           D-DIMER, QUANTITATIVE      Component Value Range   D-Dimer, Quant 0.35  0.00 - 0.48 (ug/mL-FEU)  POCT I-STAT TROPONIN I      Component Value Range   Troponin i, poc 0.00  0.00 - 0.08 (ng/mL)   Comment 3            Dg Chest Portable 1 View  11/14/2011  *RADIOLOGY REPORT*  Clinical Data: Chest pain and short of breath  PORTABLE CHEST - 1 VIEW  Comparison: 11/27/2008  Findings: Cardiac enlargement with vascular congestion.  No  edema or effusion.  Mild left lower lobe atelectasis.  IMPRESSION: Cardiac enlargement with mild vascular congestion.  Original Report Authenticated By: Camelia Phenes, M.D.     ECG shows normal sinus  rhythm with a rate of 83, no ectopy. Normal axis. Normal P wave. Normal QRS. Normal intervals. Normal ST and T waves. Impression: normal ECG. When compared with ECG of 11/27/2008, no significant changes are seen.  She was given an albuterol with Atrovent nebulizer treatment with no change. She'll be given a trial of nitroglycerin.  Nitroglycerin did not give her any relief. She was given a dose of Toradol with good but not complete relief of pain. She will be given a dose of morphine. At this point, it is felt that she has chest wall pain. I am not seeing any thing on her workup to suggest more serious pathology.  Pain is completely gone following a small dose of morphine. Repeat troponin is 0. She'll be sent home with prescription for naproxen and Percocet.   1. Chest wall pain       MDM  Chest pain which seems much more likely to be of respiratory and pulmonary cause than cardiac. She has been having a respiratory illness for the last several weeks. With normal initial ECG, will give her a trial of albuterol with Atrovent.        Dione Booze, MD 11/15/11 [phone]

## 2011-11-14 NOTE — ED Notes (Signed)
Pt reports pain onset after recent coughing episode

## 2011-11-15 MED ORDER — NAPROXEN 500 MG PO TABS: 500.0000 mg | ORAL_TABLET | Freq: Two times a day (BID) | ORAL | Status: DC

## 2011-11-15 MED ORDER — OXYCODONE-ACETAMINOPHEN 5-325 MG PO TABS: 1.0000 | ORAL_TABLET | ORAL | Status: AC | PRN

## 2011-11-15 NOTE — Discharge Instructions (Signed)
Chest Wall Pain Chest wall pain is pain in or around the bones and muscles of your chest. It may take up to 6 weeks to get better. It may take longer if you must stay physically active in your work and activities.  CAUSES  Chest wall pain may happen on its own. However, it may be caused by:  A viral illness like the flu.   Injury.   Coughing.   Exercise.   Arthritis.   Fibromyalgia.   Shingles.  HOME CARE INSTRUCTIONS   Avoid overtiring physical activity. Try not to strain or perform activities that cause pain. This includes any activities using your chest or your abdominal and side muscles, especially if heavy weights are used.   Put ice on the sore area.   Put ice in a plastic bag.   Place a towel between your skin and the bag.   Leave the ice on for 15 to 20 minutes per hour while awake for the first 2 days.   Only take over-the-counter or prescription medicines for pain, discomfort, or fever as directed by your caregiver.  SEEK IMMEDIATE MEDICAL CARE IF:   Your pain increases, or you are very uncomfortable.   You have a fever.   Your chest pain becomes worse.   You have new, unexplained symptoms.   You have nausea or vomiting.   You feel sweaty or lightheaded.   You have a cough with phlegm (sputum), or you cough up blood.  MAKE SURE YOU:   Understand these instructions.   Will watch your condition.   Will get help right away if you are not doing well or get worse.  Document Released: 07/27/2005 Document Revised: 07/16/2011 Document Reviewed: 03/23/2011 [facility] Patient Information 2012 [place], [place].  Naproxen and naproxen sodium oral immediate-release tablets What is this medicine? NAPROXEN (na PROX en) is a non-steroidal anti-inflammatory drug (NSAID). It is used to reduce swelling and to treat pain. This medicine may be used for dental pain, headache, or painful monthly periods. It is also used for painful joint and muscular problems such as  arthritis, tendinitis, bursitis, and gout. This medicine may be used for other purposes; ask your health care provider or pharmacist if you have questions. What should I tell my health care provider before I take this medicine? They need to know if you have any of these conditions: -asthma -cigarette smoker -drink more than 3 alcohol containing drinks a day -heart disease or circulation problems such as heart failure or leg edema (fluid retention) -high blood pressure -kidney disease -liver disease -stomach bleeding or ulcers -an unusual or allergic reaction to naproxen, aspirin, other NSAIDs, other medicines, foods, dyes, or preservatives -pregnant or trying to get pregnant -breast-feeding How should I use this medicine? Take this medicine by mouth with a glass of water. Follow the directions on the prescription label. Take it with food if your stomach gets upset. Try to not lie down for at least 10 minutes after you take it. Take your medicine at regular intervals. Do not take your medicine more often than directed. Long-term, continuous use may increase the risk of heart attack or stroke. A special MedGuide will be given to you by the pharmacist with each prescription and refill. Be sure to read this information carefully each time. Talk to your pediatrician regarding the use of this medicine in children. Special care may be needed. Overdosage: If you think you have taken too much of this medicine contact a poison control center or emergency  room at once. NOTE: This medicine is only for you. Do not share this medicine with others. What if I miss a dose? If you miss a dose, take it as soon as you can. If it is almost time for your next dose, take only that dose. Do not take double or extra doses. What may interact with this medicine? -alcohol -aspirin -cidofovir -diuretics -lithium -methotrexate -other drugs for inflammation like ketorolac or  prednisone -pemetrexed -probenecid -warfarin This list may not describe all possible interactions. Give your health care provider a list of all the medicines, herbs, non-prescription drugs, or dietary supplements you use. Also tell them if you smoke, drink alcohol, or use illegal drugs. Some items may interact with your medicine. What should I watch for while using this medicine? Tell your doctor or health care professional if your pain does not get better. Talk to your doctor before taking another medicine for pain. Do not treat yourself. This medicine does not prevent heart attack or stroke. In fact, this medicine may increase the chance of a heart attack or stroke. The chance may increase with longer use of this medicine and in people who have heart disease. If you take aspirin to prevent heart attack or stroke, talk with your doctor or health care professional. Do not take other medicines that contain aspirin, ibuprofen, or naproxen with this medicine. Side effects such as stomach upset, nausea, or ulcers may be more likely to occur. Many medicines available without a prescription should not be taken with this medicine. This medicine can cause ulcers and bleeding in the stomach and intestines at any time during treatment. Do not smoke cigarettes or drink alcohol. These increase irritation to your stomach and can make it more susceptible to damage from this medicine. Ulcers and bleeding can happen without warning symptoms and can cause death. You may get drowsy or dizzy. Do not drive, use machinery, or do anything that needs mental alertness until you know how this medicine affects you. Do not stand or sit up quickly, especially if you are an older patient. This reduces the risk of dizzy or fainting spells. This medicine can cause you to bleed more easily. Try to avoid damage to your teeth and gums when you brush or floss your teeth. What side effects may I notice from receiving this medicine? Side  effects that you should report to your doctor or health care professional as soon as possible: -black or bloody stools, blood in the urine or vomit -blurred vision -chest pain -difficulty breathing or wheezing -nausea or vomiting -severe stomach pain -skin rash, skin redness, blistering or peeling skin, hives, or itching -slurred speech or weakness on one side of the body -swelling of eyelids, throat, lips -unexplained weight gain or swelling -unusually weak or tired -yellowing of eyes or skin Side effects that usually do not require medical attention (report to your doctor or health care professional if they continue or are bothersome): -constipation -headache -heartburn This list may not describe all possible side effects. Call your doctor for medical advice about side effects. You may report side effects to FDA at 1-800-FDA-1088. Where should I keep my medicine? Keep out of the reach of children. Store at room temperature between 15 and 30 degrees C (59 and 86 degrees F). Keep container tightly closed. Throw away any unused medicine after the expiration date. NOTE: This sheet is a summary. It may not cover all possible information. If you have questions about this medicine, talk to your doctor, pharmacist,  or health care provider.  2012, [employer] Standard. (07/29/2009 8:10:16 PM)  Acetaminophen; Oxycodone tablets What is this medicine? ACETAMINOPHEN; OXYCODONE (a set a MEE noe fen; ox i KOE done) is a pain reliever. It is used to treat mild to moderate pain. This medicine may be used for other purposes; ask your health care provider or pharmacist if you have questions. What should I tell my health care provider before I take this medicine? They need to know if you have any of these conditions: -brain tumor -Crohn's disease, inflammatory bowel disease, or ulcerative colitis -drink more than 3 alcohol containing drinks per day -drug abuse or addiction -head injury -heart or  circulation problems -kidney disease or problems going to the bathroom -liver disease -lung disease, asthma, or breathing problems -an unusual or allergic reaction to acetaminophen, oxycodone, other opioid analgesics, other medicines, foods, dyes, or preservatives -pregnant or trying to get pregnant -breast-feeding How should I use this medicine? Take this medicine by mouth with a full glass of water. Follow the directions on the prescription label. Take your medicine at regular intervals. Do not take your medicine more often than directed. Talk to your pediatrician regarding the use of this medicine in children. Special care may be needed. Patients over 68 years old may have a stronger reaction and need a smaller dose. Overdosage: If you think you have taken too much of this medicine contact a poison control center or emergency room at once. NOTE: This medicine is only for you. Do not share this medicine with others. What if I miss a dose? If you miss a dose, take it as soon as you can. If it is almost time for your next dose, take only that dose. Do not take double or extra doses. What may interact with this medicine? -alcohol or medicines that contain alcohol -antihistamines -barbiturates like amobarbital, butalbital, butabarbital, methohexital, pentobarbital, phenobarbital, thiopental, and secobarbital -benztropine -drugs for bladder problems like solifenacin, trospium, oxybutynin, tolterodine, hyoscyamine, and methscopolamine -drugs for breathing problems like ipratropium and tiotropium -drugs for certain stomach or intestine problems like propantheline, homatropine methylbromide, glycopyrrolate, atropine, belladonna, and dicyclomine -general anesthetics like etomidate, ketamine, nitrous oxide, propofol, desflurane, enflurane, halothane, isoflurane, and sevoflurane -medicines for depression, anxiety, or psychotic disturbances -medicines for pain like codeine, morphine, pentazocine,  buprenorphine, butorphanol, nalbuphine, tramadol, and propoxyphene -medicines for sleep -muscle relaxants -naltrexone -phenothiazines like perphenazine, thioridazine, chlorpromazine, mesoridazine, fluphenazine, prochlorperazine, promazine, and trifluoperazine -scopolamine -trihexyphenidyl This list may not describe all possible interactions. Give your health care provider a list of all the medicines, herbs, non-prescription drugs, or dietary supplements you use. Also tell them if you smoke, drink alcohol, or use illegal drugs. Some items may interact with your medicine. What should I watch for while using this medicine? Tell your doctor or health care professional if your pain does not go away, if it gets worse, or if you have new or a different type of pain. You may develop tolerance to the medicine. Tolerance means that you will need a higher dose of the medication for pain relief. Tolerance is normal and is expected if you take this medicine for a long time. Do not suddenly stop taking your medicine because you may develop a severe reaction. Your body becomes used to the medicine. This does NOT mean you are addicted. Addiction is a behavior related to getting and using a drug for a nonmedical reason. If you have pain, you have a medical reason to take pain medicine. Your doctor will tell you how  much medicine to take. If your doctor wants you to stop the medicine, the dose will be slowly lowered over time to avoid any side effects. You may get drowsy or dizzy. Do not drive, use machinery, or do anything that needs mental alertness until you know how this medicine affects you. Do not stand or sit up quickly, especially if you are an older patient. This reduces the risk of dizzy or fainting spells. Alcohol may interfere with the effect of this medicine. Avoid alcoholic drinks. The medicine will cause constipation. Try to have a bowel movement at least every 2 to 3 days. If you do not have a bowel  movement for 3 days, call your doctor or health care professional. Do not take Tylenol (acetaminophen) or medicines that have acetaminophen with this medicine. Too much acetaminophen can be very dangerous. Many nonprescription medicines contain acetaminophen. Always read the labels carefully to avoid taking more acetaminophen. What side effects may I notice from receiving this medicine? Side effects that you should report to your doctor or health care professional as soon as possible: -allergic reactions like skin rash, itching or hives, swelling of the face, lips, or tongue -breathing difficulties, wheezing -confusion -light headedness or fainting spells -severe stomach pain -yellowing of the skin or the whites of the eyes Side effects that usually do not require medical attention (report to your doctor or health care professional if they continue or are bothersome): -dizziness -drowsiness -nausea -vomiting This list may not describe all possible side effects. Call your doctor for medical advice about side effects. You may report side effects to FDA at 1-800-FDA-1088. Where should I keep my medicine? Keep out of the reach of children. This medicine can be abused. Keep your medicine in a safe place to protect it from theft. Do not share this medicine with anyone. Selling or giving away this medicine is dangerous and against the law. Store at room temperature between 20 and 25 degrees C (68 and 77 degrees F). Keep container tightly closed. Protect from light. Flush any unused medicines down the toilet. Do not use the medicine after the expiration date. NOTE: This sheet is a summary. It may not cover all possible information. If you have questions about this medicine, talk to your doctor, pharmacist, or health care provider.  2012, [employer]vier/Gold Standard. (06/25/2008 10:01:21 AM)

## 2011-11-26 ENCOUNTER — Encounter (INDEPENDENT_AMBULATORY_CARE_PROVIDER_SITE_OTHER): Payer: Self-pay

## 2011-11-26 ENCOUNTER — Ambulatory Visit (INDEPENDENT_AMBULATORY_CARE_PROVIDER_SITE_OTHER): Payer: BC Managed Care – PPO | Admitting: Physician Assistant

## 2011-11-26 VITALS — BP 130/82 | Ht 69.0 in | Wt 253.8 lb

## 2011-11-26 DIAGNOSIS — K219 Gastro-esophageal reflux disease without esophagitis: Secondary | ICD-10-CM | POA: Insufficient documentation

## 2011-11-26 DIAGNOSIS — Z4651 Encounter for fitting and adjustment of gastric lap band: Secondary | ICD-10-CM

## 2011-11-26 NOTE — Patient Instructions (Signed)
Take clear liquids tonight. Thin protein shakes are ok to start tomorrow morning. Slowly advance your diet thereafter. Call us if you have persistent vomiting or regurgitation, night cough or reflux symptoms. Return as scheduled or sooner if you notice no changes in hunger/portion sizes.  

## 2011-11-26 NOTE — Progress Notes (Addendum)
  HISTORY: ARLIE POSCH is a 46 y.o.female who received an AP-Standard lap-band in July 2010 by Dr. Ezzard Standing. She comes in today with complaints of increased hunger and portion sizes since fluid removal for reflux a couple of weeks ago. She says since then her reflux has improved but she's concerned about weight gain.  VITAL SIGNS: Filed Vitals:   11/26/11 1207  BP: 130/82    PHYSICAL EXAM: Physical exam reveals a very well-appearing 46 y.o.female in no apparent distress Neurologic: Awake, alert, oriented Psych: Bright affect, conversant Respiratory: Breathing even and unlabored. No stridor or wheezing Abdomen: Soft, nontender, nondistended to palpation. Incisions well-healed. No incisional hernias. Port easily palpated. Extremities: Atraumatic, good range of motion.  ASSESMENT: 45 y.o.  female  s/p AP-Standard lap-band.   PLAN: The patient's port was accessed with a 20G Huber needle without difficulty. Clear fluid was aspirated and 1 mL saline was added to the port to give a total predicted volume of 5.1 mL. The patient was able to swallow water without difficulty following the procedure and was instructed to take clear liquids for the next 24-48 hours and advance slowly as tolerated.  If symptoms return, will consider getting UGI.  Mary Sella. Andrey Campanile, MD, FACS General, Bariatric, & Minimally Invasive Surgery [facility], [place]

## 2011-12-24 ENCOUNTER — Encounter (INDEPENDENT_AMBULATORY_CARE_PROVIDER_SITE_OTHER): Payer: Self-pay | Admitting: Surgery

## 2012-01-07 ENCOUNTER — Ambulatory Visit (INDEPENDENT_AMBULATORY_CARE_PROVIDER_SITE_OTHER): Payer: BC Managed Care – PPO | Admitting: Physician Assistant

## 2012-01-07 ENCOUNTER — Encounter (INDEPENDENT_AMBULATORY_CARE_PROVIDER_SITE_OTHER): Payer: Self-pay

## 2012-01-07 VITALS — BP 152/98 | HR 84 | Temp 98.6°F | Resp 16 | Ht 69.0 in | Wt 255.2 lb

## 2012-01-07 DIAGNOSIS — Z4651 Encounter for fitting and adjustment of gastric lap band: Secondary | ICD-10-CM

## 2012-01-07 NOTE — Progress Notes (Signed)
  HISTORY: [name] is a 46 y.o.female who received an AP-Standard lap-band in July 2010 by Dr. Ezzard Standing. She was last seen one month ago for an adjustment and has since lost no weight. She has continued hunger but feels like she's close to the green zone. She believes a small adjustment would be ideal.  VITAL SIGNS: Filed Vitals:   01/07/12 0920  BP: 152/98  Pulse: 84  Temp: 98.6 F (37 C)  Resp: 16    PHYSICAL EXAM: Physical exam reveals a very well-appearing 46 y.o.female in no apparent distress Neurologic: Awake, alert, oriented Psych: Bright affect, conversant Respiratory: Breathing even and unlabored. No stridor or wheezing Abdomen: Soft, nontender, nondistended to palpation. Incisions well-healed. No incisional hernias. Port easily palpated. Extremities: Atraumatic, good range of motion.  ASSESMENT: 46 y.o.  female  s/p AP-Standard lap-band.   PLAN: The patient's port was accessed with a 20G Huber needle without difficulty. Clear fluid was aspirated and 0.25 mL saline was added to the port to give a total predicted volume of 5.35 mL. The patient was able to swallow water without difficulty following the procedure and was instructed to take clear liquids for the next 24-48 hours and advance slowly as tolerated.

## 2012-01-07 NOTE — Patient Instructions (Signed)
Take clear liquids tonight. Thin protein shakes are ok to start tomorrow morning. Slowly advance your diet thereafter. Call us if you have persistent vomiting or regurgitation, night cough or reflux symptoms. Return as scheduled or sooner if you notice no changes in hunger/portion sizes.  

## 2012-01-20 ENCOUNTER — Encounter (INDEPENDENT_AMBULATORY_CARE_PROVIDER_SITE_OTHER): Payer: BC Managed Care – PPO | Admitting: Surgery

## 2012-02-21 ENCOUNTER — Encounter (HOSPITAL_COMMUNITY): Payer: Self-pay | Admitting: *Deleted

## 2012-02-21 ENCOUNTER — Emergency Department (HOSPITAL_COMMUNITY)
Admission: EM | Admit: 2012-02-21 | Discharge: 2012-02-21 | Disposition: A | Discharge status: Home / Self Care | Payer: BC Managed Care – PPO | Attending: Emergency Medicine | Admitting: Emergency Medicine

## 2012-02-21 ENCOUNTER — Emergency Department (HOSPITAL_COMMUNITY): Payer: BC Managed Care – PPO

## 2012-02-21 DIAGNOSIS — R5381 Other malaise: Secondary | ICD-10-CM | POA: Insufficient documentation

## 2012-02-21 DIAGNOSIS — I10 Essential (primary) hypertension: Secondary | ICD-10-CM | POA: Insufficient documentation

## 2012-02-21 DIAGNOSIS — Z79899 Other long term (current) drug therapy: Secondary | ICD-10-CM | POA: Insufficient documentation

## 2012-02-21 DIAGNOSIS — J3489 Other specified disorders of nose and nasal sinuses: Secondary | ICD-10-CM | POA: Insufficient documentation

## 2012-02-21 DIAGNOSIS — H5710 Ocular pain, unspecified eye: Secondary | ICD-10-CM | POA: Insufficient documentation

## 2012-02-21 DIAGNOSIS — R05 Cough: Secondary | ICD-10-CM | POA: Insufficient documentation

## 2012-02-21 DIAGNOSIS — H9209 Otalgia, unspecified ear: Secondary | ICD-10-CM | POA: Insufficient documentation

## 2012-02-21 DIAGNOSIS — J329 Chronic sinusitis, unspecified: Secondary | ICD-10-CM | POA: Insufficient documentation

## 2012-02-21 DIAGNOSIS — R0982 Postnasal drip: Secondary | ICD-10-CM | POA: Insufficient documentation

## 2012-02-21 DIAGNOSIS — R509 Fever, unspecified: Secondary | ICD-10-CM | POA: Insufficient documentation

## 2012-02-21 DIAGNOSIS — R5383 Other fatigue: Secondary | ICD-10-CM | POA: Insufficient documentation

## 2012-02-21 DIAGNOSIS — R059 Cough, unspecified: Secondary | ICD-10-CM | POA: Insufficient documentation

## 2012-02-21 DIAGNOSIS — R51 Headache: Secondary | ICD-10-CM | POA: Insufficient documentation

## 2012-02-21 MED ORDER — OXYCODONE-ACETAMINOPHEN 5-325 MG PO TABS
2.0000 | ORAL_TABLET | Freq: Once | ORAL | Status: DC
  Filled 2012-02-21: qty 2

## 2012-02-21 MED ORDER — OXYCODONE-ACETAMINOPHEN 5-325 MG PO TABS: 2.0000 | ORAL_TABLET | ORAL | Status: AC | PRN

## 2012-02-21 MED ORDER — LEVOFLOXACIN 500 MG PO TABS: 500.0000 mg | ORAL_TABLET | Freq: Every day | ORAL | Status: AC

## 2012-02-21 MED ORDER — OXYMETAZOLINE HCL 0.05 % NA SOLN
2.0000 | Freq: Once | NASAL | Status: AC
  Administered 2012-02-21: 2 via NASAL
  Filled 2012-02-21: qty 15

## 2012-02-21 NOTE — ED Provider Notes (Signed)
History     CSN: [id number]  Arrival date & time 02/21/12  0756   First MD Initiated Contact with Patient 02/21/12 [phone]      Chief Complaint  Patient presents with  . Headache    (Consider location/radiation/quality/duration/timing/severity/associated sxs/prior treatment) HPI Comments: Patient presented with a one-week history of a throbbing-type headache located in the left side of her head. She also describes it as a pain behind her left eye. She hasn't drop into her eye. She denies a vision changes or redness to the eye. Denies any drainage from the eye. She's had some sinus pressure as well as postnasal drip and mild cough. She initially had some fevers but hasn't had any recent fevers. Most complains now of left-sided headache as well as a lot of drainage down the back of her throat. She was seen by urgent care one week ago and started on Ceftin and a course of prednisone and Fioricet for the pain. She states her symptoms got better for a few days but then started getting worse again. She is concerned because she has to do a lot of traveling next week and she's worried that it's not any better.  Patient is a 46 y.o. female presenting with headaches. The history is provided by the patient.  Headache  This is a new problem. Associated symptoms include a fever. Pertinent negatives include no shortness of breath, no nausea and no vomiting.    Past Medical History  Diagnosis Date  . Sinus congestion   . Bleeding from the nose   . Hypertension   . Anemia   . Uterine fibroid     Past Surgical History  Procedure Date  . Laparoscopic gastric banding 02/18/2009  . Abdominal hysterectomy     partial    Family History  Problem Relation Age of Onset  . Cancer Father     lung and prostate    History  Substance Use Topics  . Smoking status: Never Smoker   . Smokeless tobacco: Not on file  . Alcohol Use: No    OB History    Grav Para Term Preterm Abortions TAB SAB Ect Mult  Living                  Review of Systems  Constitutional: Positive for fever and fatigue. Negative for chills and diaphoresis.  HENT: Positive for ear pain, congestion, sore throat, rhinorrhea, postnasal drip and sinus pressure. Negative for facial swelling, sneezing, neck pain and neck stiffness.   Eyes: Positive for pain. Negative for photophobia, redness and visual disturbance.  Respiratory: Negative for cough, chest tightness and shortness of breath.   Cardiovascular: Negative for chest pain and leg swelling.  Gastrointestinal: Negative for nausea, vomiting, abdominal pain, diarrhea and blood in stool.  Genitourinary: Negative for frequency, hematuria, flank pain and difficulty urinating.  Musculoskeletal: Negative for back pain and arthralgias.  Skin: Negative for rash.  Neurological: Positive for headaches. Negative for dizziness, speech difficulty, weakness and numbness.    Allergies  Flagyl  Home Medications   Current Outpatient Rx  Name Route Sig Dispense Refill  . ALPRAZOLAM 0.5 MG PO TABS Oral Take 0.5 mg by mouth 3 (three) times daily as needed. For anxiety    . ALUMINUM CHLORIDE 20 % EX SOLN Topical Apply 1 application topically daily.    Marland Kitchen AMLODIPINE BESYLATE 2.5 MG PO TABS Oral Take 2.5 mg by mouth daily.    . BUPROPION HCL 100 MG PO TABS Oral Take 100 mg  by mouth 2 (two) times daily.    Marland Kitchen BUTALBITAL-APAP-CAFFEINE 50-325-40 MG PO TABS Oral Take 1 tablet by mouth 3 (three) times daily as needed. For headache    . CEFUROXIME AXETIL 500 MG PO TABS Oral Take 500 mg by mouth 2 (two) times daily. For 10 days    . CETIRIZINE HCL 10 MG PO TABS Oral Take 10 mg by mouth daily.    Marland Kitchen MONTELUKAST SODIUM 10 MG PO TABS Oral Take 10 mg by mouth at bedtime.      . ADULT MULTIVITAMIN W/MINERALS CH Oral Take 1 tablet by mouth daily.    Marland Kitchen OLMESARTAN MEDOXOMIL-HCTZ 40-25 MG PO TABS Oral Take 1 tablet by mouth daily.    Marland Kitchen OMEPRAZOLE 40 MG PO CPDR Oral Take 40 mg by mouth daily.     Marland Kitchen  PREDNISONE (PAK) 10 MG PO TABS Oral Take 10 mg by mouth daily. 5 day dose pack finished 2 days ago. 02/18/12    . PROVENTIL HFA 108 (90 BASE) MCG/ACT IN AERS Inhalation Inhale 2 puffs into the lungs every 4 (four) hours as needed. For  wheezing    . VITAMIN B-1 100 MG PO TABS Oral Take 100 mg by mouth daily.    Marland Kitchen LEVOFLOXACIN 500 MG PO TABS Oral Take 1 tablet (500 mg total) by mouth daily. 10 tablet 0  . OXYCODONE-ACETAMINOPHEN 5-325 MG PO TABS Oral Take 2 tablets by mouth every 4 (four) hours as needed for pain. 15 tablet 0    BP 140/83  Pulse 83  Temp 98.5 F (36.9 C) (Oral)  Resp 20  Ht 5\' 9"  (1.753 m)  Wt 250 lb (113.399 kg)  BMI 36.92 kg/m2  SpO2 100%  Physical Exam  Constitutional: She is oriented to person, place, and time. She appears well-developed and well-nourished.  HENT:  Head: Normocephalic and atraumatic.  Right Ear: External ear normal.  Left Ear: External ear normal.  Mouth/Throat: Oropharynx is clear and moist.       Positive tenderness over the left sinuses  Eyes: Conjunctivae and EOM are normal. Pupils are equal, round, and reactive to light.       No redness or photophobia or drainage from the left eye. No pain with range of motion of the left eye.  Neck: Normal range of motion. Neck supple.  Cardiovascular: Normal rate, regular rhythm and normal heart sounds.   Pulmonary/Chest: Effort normal and breath sounds normal. No respiratory distress. She has no wheezes. She has no rales. She exhibits no tenderness.  Abdominal: Soft. Bowel sounds are normal. There is no tenderness. There is no rebound and no guarding.  Musculoskeletal: Normal range of motion. She exhibits no edema.  Lymphadenopathy:    She has no cervical adenopathy.  Neurological: She is alert and oriented to person, place, and time.  Skin: Skin is warm and dry. No rash noted.  Psychiatric: She has a normal mood and affect.    ED Course  Procedures (including critical care time)  Results for  orders placed during the hospital encounter of 11/14/11  CBC      Component Value Range   WBC 8.1  4.0 - 10.5 K/uL   RBC 4.48  3.87 - 5.11 MIL/uL   Hemoglobin 13.6  12.0 - 15.0 g/dL   HCT 16.1  09.6 - 04.5 %   MCV 87.3  78.0 - 100.0 fL   MCH 30.4  26.0 - 34.0 pg   MCHC 34.8  30.0 - 36.0 g/dL   RDW  12.7  11.5 - 15.5 %   Platelets 347  150 - 400 K/uL  DIFFERENTIAL      Component Value Range   Neutrophils Relative 58  43 - 77 %   Neutro Abs 4.7  1.7 - 7.7 K/uL   Lymphocytes Relative 33  12 - 46 %   Lymphs Abs 2.7  0.7 - 4.0 K/uL   Monocytes Relative 8  3 - 12 %   Monocytes Absolute 0.7  0.1 - 1.0 K/uL   Eosinophils Relative 1  0 - 5 %   Eosinophils Absolute 0.1  0.0 - 0.7 K/uL   Basophils Relative 1  0 - 1 %   Basophils Absolute 0.1  0.0 - 0.1 K/uL  COMPREHENSIVE METABOLIC PANEL      Component Value Range   Sodium 135  135 - 145 mEq/L   Potassium 3.4 (*) 3.5 - 5.1 mEq/L   Chloride 98  96 - 112 mEq/L   CO2 26  19 - 32 mEq/L   Glucose, Bld 93  70 - 99 mg/dL   BUN 10  6 - 23 mg/dL   Creatinine, Ser 1.61  0.50 - 1.10 mg/dL   Calcium 09.6  8.4 - 04.5 mg/dL   Total Protein 7.1  6.0 - 8.3 g/dL   Albumin 3.7  3.5 - 5.2 g/dL   AST 15  0 - 37 U/L   ALT 12  0 - 35 U/L   Alkaline Phosphatase 72  39 - 117 U/L   Total Bilirubin 0.5  0.3 - 1.2 mg/dL   GFR calc non Af Amer 88 (*) >90 mL/min   GFR calc Af Amer >90  >90 mL/min  POCT I-STAT TROPONIN I      Component Value Range   Troponin i, poc 0.00  0.00 - 0.08 ng/mL   Comment 3           D-DIMER, QUANTITATIVE      Component Value Range   D-Dimer, Quant 0.35  0.00 - 0.48 ug/mL-FEU  POCT I-STAT TROPONIN I      Component Value Range   Troponin i, poc 0.00  0.00 - 0.08 ng/mL   Comment 3            Ct Head Wo Contrast  02/21/2012  *RADIOLOGY REPORT*  Clinical Data:  Headache.  Left facial and retroorbital pain.  CT HEAD WITHOUT CONTRAST  Technique: Contiguous axial images were obtained from the base of the skull through the vertex  without contrast  Comparison: None  Findings:  There is no evidence of intracranial hemorrhage, brain edema, or other signs of acute infarction.  There is no evidence of intracranial mass lesion or mass effect.  No abnormal extraaxial fluid collections are identified.  There is no evidence of hydrocephalus, or other significant intracranial abnormality.  No skull abnormality identified.  Near complete opacification of the left maxillary sinus is seen with central calcification.  There is also mucosal thickening seen involving the left ethmoid, frontal and sphenoid sinuses.  These findings are consistent with chronic sinusitis.  IMPRESSION: 1.  No evidence of intracranial abnormality. 2.  Chronic sinusitis with near complete opacification of the left maxillary sinus.  Original Report Authenticated By: Danae Orleans, M.D.     1. Sinusitis       MDM  Patient symptoms are consistent with sinusitis which is confirmed on the CT scan. There is nothing to suggest, Orbital cellulitis, glaucoma, or other eye etiology. No masses were seen on  CT scan. I will treat patient with antibiotics, pain medicine, warm compresses to the sinuses. Also advised her to use Afrin for 3 days only. I will give her referral followup with ENT if symptoms are not improving        Rolan Bucco, MD 02/21/12 1008

## 2012-02-21 NOTE — ED Notes (Signed)
Patient is alert and oriented x3.  She is complaining of a headache, ear ache, soar throat with productive cough. She currently rates her pain a 10 of 10.  She was seen last week at emergent care and given antibiotics and Pain medication for this issue with no relief

## 2012-04-01 ENCOUNTER — Other Ambulatory Visit: Payer: Self-pay | Admitting: Otolaryngology

## 2012-04-07 ENCOUNTER — Encounter (INDEPENDENT_AMBULATORY_CARE_PROVIDER_SITE_OTHER): Payer: Self-pay | Admitting: Physician Assistant

## 2012-04-07 ENCOUNTER — Ambulatory Visit (INDEPENDENT_AMBULATORY_CARE_PROVIDER_SITE_OTHER): Payer: BC Managed Care – PPO | Admitting: Physician Assistant

## 2012-04-07 VITALS — BP 124/82 | Ht 69.0 in | Wt 255.6 lb

## 2012-04-07 DIAGNOSIS — Z4651 Encounter for fitting and adjustment of gastric lap band: Secondary | ICD-10-CM

## 2012-04-07 NOTE — Patient Instructions (Signed)
Take clear liquids tonight. Thin protein shakes are ok to start tomorrow morning. Slowly advance your diet thereafter. Call us if you have persistent vomiting or regurgitation, night cough or reflux symptoms. Return as scheduled or sooner if you notice no changes in hunger/portion sizes.  

## 2012-04-07 NOTE — Progress Notes (Signed)
  HISTORY: [name] is a 46 y.o.female who received an AP-Standard lap-band in July 2010 by Dr. Ezzard Standing. She underwent sinus surgery last week and is having some persistent bloody discharge. Despite this, she remains hungry and is concerned that she's not lost any weight over the course of the summer. Her hunger persists and she's eating more than she desires. She denies regurgitation or reflux symptoms. She would like a small adjustment today to get her weight loss back on track.  VITAL SIGNS: Filed Vitals:   04/07/12 0839  BP: 124/82    PHYSICAL EXAM: Physical exam reveals a very well-appearing 46 y.o.female in no apparent distress Neurologic: Awake, alert, oriented Psych: Bright affect, conversant Respiratory: Breathing even and unlabored. No stridor or wheezing Abdomen: Soft, nontender, nondistended to palpation. Incisions well-healed. No incisional hernias. Port easily palpated. Extremities: Atraumatic, good range of motion.  ASSESMENT: 46 y.o.  female  s/p AP-Standard lap-band.   PLAN: The patient's port was accessed with a 20G Huber needle without difficulty. Clear fluid was aspirated and 0.25 mL saline was added to the port to give a total predicted volume of 5.6 mL. The patient was able to swallow water without difficulty following the procedure and was instructed to take clear liquids for the next 24-48 hours and advance slowly as tolerated.

## 2012-04-12 ENCOUNTER — Telehealth (INDEPENDENT_AMBULATORY_CARE_PROVIDER_SITE_OTHER): Payer: Self-pay | Admitting: General Surgery

## 2012-04-12 NOTE — Telephone Encounter (Signed)
Patient called complaining of lap band too tight. She had an adjustment on Thursday 04/07/12. She is able to keep down a small amount of food and liquids. I spoke to Dr Andrey Campanile who says since she is keeping down liquids okay to put on schedule for tomorrow with Dr Daphine Deutscher. Appt made with patient.

## 2012-04-13 ENCOUNTER — Ambulatory Visit (INDEPENDENT_AMBULATORY_CARE_PROVIDER_SITE_OTHER): Payer: BC Managed Care – PPO | Admitting: Surgery

## 2012-04-13 ENCOUNTER — Encounter (INDEPENDENT_AMBULATORY_CARE_PROVIDER_SITE_OTHER): Payer: Self-pay | Admitting: Surgery

## 2012-04-13 VITALS — BP 110/64 | HR 88 | Temp 97.5°F | Resp 16 | Ht 69.0 in | Wt 255.2 lb

## 2012-04-13 DIAGNOSIS — Z9884 Bariatric surgery status: Secondary | ICD-10-CM

## 2012-04-13 NOTE — Patient Instructions (Signed)

## 2012-04-13 NOTE — Progress Notes (Signed)
[name] Body mass index is 37.69 kg/(m^2).  Having regurgitation: yes and nocturnal reflux  Nocturnal reflux?  yes  Amount of fill  -0.3  Since her fill a few days ago she has had more problems with nocturnal reflux. She has also undergone significant nasal surgery on the left side for sinusitis. Interestingly this is on the side that she sleeps with her head down and no stone or whether this might be related to some nocturnal reflux. I went ahead and withdrew a total of 0.3 cc that included about 0.1 cc of air from her band. Him and get her back to see Dr. Ezzard Standing sometime in late October for followup. 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

## 2012-04-23 ENCOUNTER — Emergency Department (HOSPITAL_COMMUNITY)
Admission: EM | Admit: 2012-04-23 | Discharge: 2012-04-24 | Disposition: A | Discharge status: Home / Self Care | Payer: BC Managed Care – PPO | Attending: Emergency Medicine | Admitting: Emergency Medicine

## 2012-04-23 ENCOUNTER — Encounter (HOSPITAL_COMMUNITY): Payer: Self-pay

## 2012-04-23 DIAGNOSIS — Z4651 Encounter for fitting and adjustment of gastric lap band: Secondary | ICD-10-CM | POA: Insufficient documentation

## 2012-04-23 DIAGNOSIS — Z9884 Bariatric surgery status: Secondary | ICD-10-CM

## 2012-04-23 DIAGNOSIS — R109 Unspecified abdominal pain: Secondary | ICD-10-CM | POA: Insufficient documentation

## 2012-04-23 DIAGNOSIS — R10819 Abdominal tenderness, unspecified site: Secondary | ICD-10-CM | POA: Insufficient documentation

## 2012-04-23 DIAGNOSIS — R112 Nausea with vomiting, unspecified: Secondary | ICD-10-CM | POA: Insufficient documentation

## 2012-04-23 HISTORY — DX: Obesity, unspecified: E66.9

## 2012-04-23 NOTE — ED Notes (Signed)
Per EMS, pt s/p lap band which was loosened two weeks ago.  She also has sinus sx two weeks ago under general anesthesia followed by 5-d course of unknown Abx. Pain was sudden onset and "crampy" in nature. Appears localized to rt quad. Pt gave herself Phenergan supp prior to EMS arrival. Still c/o nausea. 20g IV started and Zofran 4mg  given enroute. C/o IV burning. EMS removed pta.

## 2012-04-23 NOTE — ED Notes (Signed)
[id number]> Expected date:<BR> Expected time:<BR> Means of arrival:<BR> Comments:<BR> Rm 15, Medic 10, 46 F, Abd Pain, Recent Lap Sx

## 2012-04-24 ENCOUNTER — Emergency Department (HOSPITAL_COMMUNITY): Payer: BC Managed Care – PPO

## 2012-04-24 LAB — URINALYSIS, MICROSCOPIC ONLY
Glucose, UA: NEGATIVE mg/dL
Leukocytes, UA: NEGATIVE
Protein, ur: NEGATIVE mg/dL
Specific Gravity, Urine: 1.025 (ref 1.005–1.030)
Urobilinogen, UA: 1 mg/dL (ref 0.0–1.0)

## 2012-04-24 LAB — CBC WITH DIFFERENTIAL/PLATELET
Basophils Absolute: 0 10*3/uL (ref 0.0–0.1)
Basophils Relative: 0 % (ref 0–1)
Eosinophils Absolute: 0 10*3/uL (ref 0.0–0.7)
Eosinophils Relative: 1 % (ref 0–5)
HCT: 34.1 % — ABNORMAL LOW (ref 36.0–46.0)
Lymphocytes Relative: 16 % (ref 12–46)
MCH: 30.4 pg (ref 26.0–34.0)
MCHC: 34.9 g/dL (ref 30.0–36.0)
MCV: 87.2 fL (ref 78.0–100.0)
Monocytes Absolute: 0.7 10*3/uL (ref 0.1–1.0)
Platelets: 318 10*3/uL (ref 150–400)
RDW: 12.8 % (ref 11.5–15.5)
WBC: 6.9 10*3/uL (ref 4.0–10.5)

## 2012-04-24 LAB — COMPREHENSIVE METABOLIC PANEL
ALT: 11 U/L (ref 0–35)
AST: 13 U/L (ref 0–37)
Albumin: 3.6 g/dL (ref 3.5–5.2)
CO2: 27 mEq/L (ref 19–32)
Calcium: 8.8 mg/dL (ref 8.4–10.5)
Creatinine, Ser: 0.83 mg/dL (ref 0.50–1.10)
GFR calc non Af Amer: 83 mL/min — ABNORMAL LOW (ref >90)
Sodium: 133 mEq/L — ABNORMAL LOW (ref 135–145)
Total Protein: 6.8 g/dL (ref 6.0–8.3)

## 2012-04-24 MED ORDER — IOHEXOL 300 MG/ML  SOLN
100.0000 mL | Freq: Once | INTRAMUSCULAR | Status: AC | PRN
  Administered 2012-04-24: 100 mL via INTRAVENOUS

## 2012-04-24 MED ORDER — ONDANSETRON HCL 4 MG/2ML IJ SOLN
4.0000 mg | Freq: Once | INTRAMUSCULAR | Status: AC
  Administered 2012-04-24: 4 mg via INTRAVENOUS
  Filled 2012-04-24: qty 2

## 2012-04-24 MED ORDER — ONDANSETRON 8 MG PO TBDP: 8.0000 mg | ORAL_TABLET | Freq: Three times a day (TID) | ORAL | Status: DC | PRN

## 2012-04-24 MED ORDER — SODIUM CHLORIDE 0.9 % IV BOLUS (SEPSIS)
1000.0000 mL | Freq: Once | INTRAVENOUS | Status: AC
  Administered 2012-04-24: 1000 mL via INTRAVENOUS

## 2012-04-24 MED ORDER — HYDROMORPHONE HCL PF 1 MG/ML IJ SOLN
1.0000 mg | Freq: Once | INTRAMUSCULAR | Status: AC
  Administered 2012-04-24: 1 mg via INTRAVENOUS
  Filled 2012-04-24: qty 1

## 2012-04-24 MED ORDER — SODIUM CHLORIDE 0.9 % IV SOLN
Freq: Once | INTRAVENOUS | Status: AC
  Administered 2012-04-24: 06:00:00 via INTRAVENOUS

## 2012-04-24 NOTE — ED Provider Notes (Addendum)
History     CSN: [id number]  Arrival date & time 04/23/12  2334   First MD Initiated Contact with Patient 04/24/12 0015      Chief Complaint  Patient presents with  . Abdominal Pain     The history is provided by the patient.   patient reports developing acute onset upper abdominal pain with associated nausea and vomiting this evening.  His been persistent for about 2-3 hours.  She has a history of lap band surgery reports having fluid taken out of her lab and about 2 and half weeks ago.  She has no known history of gallstones as far she knows.  She reports a small amount of blood in her vomit.  She denies diarrhea.  She reports no recent sick contacts.  Her symptoms are moderate in severity.    Past Medical History  Diagnosis Date  . Hypertension   . Obesity     Past Surgical History  Procedure Date  . Laparoscopic gastric banding   . Nasal sinus surgery 2013  . Partial hysterectomy     History reviewed. No pertinent family history.  History  Substance Use Topics  . Smoking status: Never Smoker   . Smokeless tobacco: Never Used  . Alcohol Use: 0.0 oz/week    1-2 Glasses of wine per week    OB History    Grav Para Term Preterm Abortions TAB SAB Ect Mult Living                  Review of Systems  Gastrointestinal: Positive for abdominal pain.  All other systems reviewed and are negative.    Allergies  Flagyl  Home Medications   Current Outpatient Rx  Name Route Sig Dispense Refill  . ALBUTEROL SULFATE HFA 108 (90 BASE) MCG/ACT IN AERS Inhalation Inhale 2 puffs into the lungs every 6 (six) hours as needed.    Marland Kitchen AMLODIPINE BESYLATE PO Oral Take by mouth.    Marland Kitchen BIOTIN 5000 MCG PO TABS Oral Take 1 tablet by mouth daily.    Marland Kitchen CETIRIZINE HCL 10 MG PO TABS Oral Take 10 mg by mouth daily.    . ADULT MULTIVITAMIN W/MINERALS CH Oral Take 1 tablet by mouth daily.    Marland Kitchen OLMESARTAN MEDOXOMIL-HCTZ 40-25 MG PO TABS Oral Take 1 tablet by mouth daily.    Marland Kitchen OMEPRAZOLE 20  MG PO CPDR Oral Take 20 mg by mouth daily.    Marland Kitchen VITAMIN B-12 1000 MCG PO TABS Oral Take 1,000 mcg by mouth daily.      BP 103/40  Pulse 62  Temp 97.9 F (36.6 C) (Oral)  Resp 18  SpO2 100%  Physical Exam  Nursing note and vitals reviewed. Constitutional: She is oriented to person, place, and time. She appears well-developed and well-nourished. No distress.  HENT:  Head: Normocephalic and atraumatic.  Eyes: EOM are normal.  Neck: Normal range of motion.  Cardiovascular: Normal rate, regular rhythm and normal heart sounds.   Pulmonary/Chest: Effort normal and breath sounds normal.  Abdominal: Soft. She exhibits no distension.       Mild epigastric and right upper quadrant tenderness without guarding or rebound.  Lap port is palpable.  No distention.  No peritonitis  Musculoskeletal: Normal range of motion.  Neurological: She is alert and oriented to person, place, and time.  Skin: Skin is warm and dry.  Psychiatric: She has a normal mood and affect. Judgment normal.    ED Course  Procedures (including critical care time)  Labs Reviewed  CBC WITH DIFFERENTIAL - Abnormal; Notable for the following:    Hemoglobin 11.9 (*)     HCT 34.1 (*)     All other components within normal limits  COMPREHENSIVE METABOLIC PANEL - Abnormal; Notable for the following:    Sodium 133 (*)     Potassium 3.4 (*)     Glucose, Bld 108 (*)     GFR calc non Af Amer 83 (*)     All other components within normal limits  URINALYSIS, WITH MICROSCOPIC - Abnormal; Notable for the following:    APPearance TURBID (*)     Hgb urine dipstick SMALL (*)     Ketones, ur 15 (*)     Bacteria, UA MANY (*)     Squamous Epithelial / LPF MANY (*)     All other components within normal limits  LIPASE, BLOOD   Ct Abdomen Pelvis W Contrast  04/24/2012  *RADIOLOGY REPORT*  Clinical Data: Crampy abdominal pain.  CT ABDOMEN AND PELVIS WITH CONTRAST  Technique:  Multidetector CT imaging of the abdomen and pelvis was  performed following the standard protocol during bolus administration of intravenous contrast.  Contrast: OMNIPAQUE IOHEXOL 300 MG/ML  SOLN  Comparison: 04/24/2012 radiograph  Findings: Mild bibasilar atelectasis.  Heart size within normal limits.  There is marked distension of the esophagus and proximal stomach to the level of the laparoscopic adjustable gastric banding (LAGB). Ingested contrast fails to empty into the distal stomach. There is eccentric dilatation of the pouch and the band is located more inferiorly than expected.  Bowel loops are otherwise decompressed.  Unremarkable liver, biliary system, spleen, pancreas, adrenal glands, right kidney.  Left renal cyst.  No hydronephrosis or hydroureter.  Thin-walled bladder.  Bilateral adnexal cyst.  Trace free fluid within the pelvis.  No lymphadenopathy.  No free intraperitoneal air.  Normal caliber vasculature.  No acute osseous finding.  IMPRESSION: Status post LAGB with pouch dilatation/stomal obstruction. Abnormal phi angle suggests gastric band slippage.   Original Report Authenticated By: Waneta Martins, M.D.    Dg Abd 2 Views  04/24/2012  *RADIOLOGY REPORT*  Clinical Data: Nausea, vomiting, lap band.  ABDOMEN - 2 VIEW  Comparison: None.  Findings: Laparoscopic adjustable gastric band projects over the left upper quadrant. Phi angle of approximately 80 degrees. Relative paucity small bowel gas.  There is a small amount of gas within normal caliber colon.  Organ outlines normal where seen.  No free intraperitoneal air.  Lung bases are clear.  No acute osseous finding.  IMPRESSION: Bowel gas pattern is nonspecific with a relative paucity of small bowel gas.  This can be seen in the setting of decompressed or fluid filled loops.  Air within normal caliber colon disfavoring complete obstruction.  Laparoscopic adjustable gastric band with increased phi angle. This may indicate malposition.   Original Report Authenticated By: Waneta Martins,  M.D.     I personally reviewed the imaging tests through PACS system  I reviewed available ER/hospitalization records thought the EMR   1. Abdominal pain   2. Hx of laparoscopic gastric banding       MDM  Given that patient had fluid taken out.  This may represent LAP-BAND issues.  Plain films pending.  CMP and lipase will be sent as some this may represent biliary colic.  L2 the patient symptomatically at this time.    3:21 AM Spoke with Dr Purnell Shoemaker who will pass the information on to Dr Daphine Deutscher in the  AM.   7:40 AM Surgery to be contacted again for lap band evaluation causing gastric obstruction  Lyanne Co, MD 04/24/12 0741  8:30 AM See by Dr. Daphine Deutscher who removed 5 cc from her band.  He reports she's keeping fluids at this time and feels much better.  Discharge home with close general surgery followup.  She understands return to the ER for new or worsening symptoms  Lyanne Co, MD 04/24/12 0830

## 2012-04-24 NOTE — ED Notes (Signed)
IV attempted by 2 RNs x 2. 3rd RN attempting to start IV.

## 2012-04-24 NOTE — ED Notes (Signed)
RN request to obtain labs with the start of IV 

## 2012-04-24 NOTE — Consult Note (Signed)
[name]. [name] There is no height or weight on file to calculate BMI.  Having regurgitation:  Yes-came ER at midnight after eating salad thinking that she may have had food poisoning  Nocturnal reflux?  yes  Amount of fill  -5 cc  Ms. [name] had sinus surgery recently and I think has a pan inflammation to go with it that has made her well-adjusted band suddenly become too tight.  I had seen her in the office about a week ago and removed a small amount of fluid  for symptoms of too tight.  Obviously this wasn't enough.  Today, I gave her a band vacation and removed 5 cc-also drawing somewhat of a vacuum when removing this amount of fluid.  She has an appointment to see Dr. Ezzard Standing in October and I told her to keep that appointment.  I have encouraged her to get back on protein shakes and a lower carb diet to prevent excessive weight regain during her band vacation.

## 2012-04-24 NOTE — ED Notes (Signed)
Spoke with CT. They will not be able to run contrast unless we have 20g IV or bigger. Fleet Contras, RN attempting to get a larger IV.

## 2012-04-25 ENCOUNTER — Emergency Department (HOSPITAL_COMMUNITY)
Admission: EM | Admit: 2012-04-25 | Discharge: 2012-04-25 | Disposition: A | Discharge status: Home / Self Care | Payer: BC Managed Care – PPO | Attending: Emergency Medicine | Admitting: Emergency Medicine

## 2012-04-25 ENCOUNTER — Encounter (INDEPENDENT_AMBULATORY_CARE_PROVIDER_SITE_OTHER): Payer: Self-pay | Admitting: Surgery

## 2012-04-25 DIAGNOSIS — Z0389 Encounter for observation for other suspected diseases and conditions ruled out: Secondary | ICD-10-CM | POA: Insufficient documentation

## 2012-05-05 DIAGNOSIS — R911 Solitary pulmonary nodule: Secondary | ICD-10-CM | POA: Insufficient documentation

## 2012-05-06 ENCOUNTER — Other Ambulatory Visit: Payer: Self-pay | Admitting: Obstetrics & Gynecology

## 2012-05-06 DIAGNOSIS — Z1231 Encounter for screening mammogram for malignant neoplasm of breast: Secondary | ICD-10-CM

## 2012-06-01 ENCOUNTER — Encounter (INDEPENDENT_AMBULATORY_CARE_PROVIDER_SITE_OTHER): Payer: Self-pay | Admitting: Surgery

## 2012-06-01 ENCOUNTER — Ambulatory Visit (INDEPENDENT_AMBULATORY_CARE_PROVIDER_SITE_OTHER): Payer: BC Managed Care – PPO | Admitting: Surgery

## 2012-06-01 VITALS — BP 132/74 | HR 70 | Temp 97.4°F | Resp 18 | Ht 69.0 in | Wt 262.8 lb

## 2012-06-01 DIAGNOSIS — Z9884 Bariatric surgery status: Secondary | ICD-10-CM

## 2012-06-01 DIAGNOSIS — Z4651 Encounter for fitting and adjustment of gastric lap band: Secondary | ICD-10-CM

## 2012-06-01 NOTE — Progress Notes (Addendum)
[facility]  Ovidio Kin, MD,  FACS [address]  [address] [place], [place]    [address] Phone:  [phone] FAX:  [phone]   Re:   Tyreanna Bisesi DOB:   [birth year] MRN:   [id number]  ASSESSMENT AND PLAN: 1.  Lap band - 02/19/2012  Initial weight - 312, BMI - 46.3  Last saw Dr. Daphine Deutscher in the [facility] in Sept 2013 when the lap band was too tight. He removed all the fluid.  There is a suggestion on the CT scan done at that time of a slip, but I cannot view x-rays today in Epic (some glitch).   I have spoke to [place] at [facility] [phone]) about the problem.  I need to review the CT scan.  The patient will call back in few days if she has not heard from me.   Today I added 3.5 cc fluid back to her lap band.  I will see her back in 6 weeks.  [The films had been filed under the last name "Raper".  Though there is a more stomach above the lap band than expected, there is no evidence of a slip.  On the CT scan, there was esophageal dilatation.  DN  06/01/2012]  2.  GERD  Seems like she has had more trouble that she should with this.  Back on Prilosec 40 mg BID 3.  Hypertension 4.  History of bronchitis 5.  Sinus trouble - had recent sinus surgery By Dr. Allegra Grana, about 04/10/2012 - she has done well with this 6.  Married Feb 2013  But her husband's only son committed suicide and he is struggling with this.  HISTORY OF PRESENT ILLNESS: Chief Complaint  Patient presents with  . Lap Band Fill    Kimblery Diop is a 46 y.o. (DOB: [birth year])  AA female who is a patient of DEWEY,ELIZABETH, MD and comes to me today for lap band follow up.  The patient had sinus surgery by Dr. Allegra Grana on 04/10/2012. She had the smell of an old sweaty sock ini her sinuses.  She used a netty pot to  Treat her sinuses with antibiotics. She is doing much better since completed his treatment and she saw Dr. Pollyann Kennedy for followup who is now signed off of her.  During this  time she had significant esophageal spasm.  Dr. Duanne Guess gave her Omeprazole 40 mg BID, which has helped her esophagus and spasm.  It was during these episodes that she came to the [facility] with what was felt to be her band too tight.  She said that she sat in the [facility] all night.  Dr. Daphine Deutscher removed all the fluid from the lap band.  She has had no further symptoms and gained about 7 pounds of weight.  The one concern is that the CT scan suggested a "slipped" lap band.  CT scan - 04/24/2012 - suggests that there may be a slip in the lap band  PHYSICAL EXAM: BP 132/74  Pulse 70  Temp 97.4 F (36.3 C) (Oral)  Resp 18  Ht 5\' 9"  (1.753 m)  Wt 262 lb 12.8 oz (119.205 kg)  BMI 38.81 kg/m2  Chest:  Clear Abdomen: Soft.  Lap band port easy to feel.  No tenderness or mass.  Procedure:  I added 3.5 cc of fluid in the lap band.  She tolerated water.  I have not had a chance to review her x-rays.  DATA REVIEWED: Old lap band  fills.   Ovidio Kin, MD, FACS Office:  [phone]

## 2012-06-02 ENCOUNTER — Ambulatory Visit (HOSPITAL_COMMUNITY)
Admission: RE | Admit: 2012-06-02 | Discharge: 2012-06-02 | Disposition: A | Discharge status: Home / Self Care | Payer: BC Managed Care – PPO | Source: Ambulatory Visit | Attending: Obstetrics & Gynecology | Admitting: Obstetrics & Gynecology

## 2012-06-02 DIAGNOSIS — Z1231 Encounter for screening mammogram for malignant neoplasm of breast: Secondary | ICD-10-CM | POA: Insufficient documentation

## 2012-06-23 ENCOUNTER — Encounter (INDEPENDENT_AMBULATORY_CARE_PROVIDER_SITE_OTHER): Payer: BC Managed Care – PPO

## 2012-06-30 ENCOUNTER — Encounter (INDEPENDENT_AMBULATORY_CARE_PROVIDER_SITE_OTHER): Payer: BC Managed Care – PPO

## 2012-07-04 ENCOUNTER — Encounter (HOSPITAL_COMMUNITY): Payer: Self-pay

## 2012-07-13 ENCOUNTER — Ambulatory Visit (INDEPENDENT_AMBULATORY_CARE_PROVIDER_SITE_OTHER): Payer: BC Managed Care – PPO | Admitting: Surgery

## 2012-07-13 ENCOUNTER — Encounter (INDEPENDENT_AMBULATORY_CARE_PROVIDER_SITE_OTHER): Payer: Self-pay | Admitting: Surgery

## 2012-07-13 VITALS — BP 136/80 | HR 62 | Temp 98.6°F | Resp 16 | Ht 69.0 in | Wt 265.0 lb

## 2012-07-13 DIAGNOSIS — Z9884 Bariatric surgery status: Secondary | ICD-10-CM

## 2012-07-13 DIAGNOSIS — Z4651 Encounter for fitting and adjustment of gastric lap band: Secondary | ICD-10-CM

## 2012-07-13 NOTE — Progress Notes (Signed)
[facility]  Pamela Kin, MD,  FACS [address] [place]  [address] [place], [place]    [address] Phone:  [phone] FAX:  [phone]   Re:   [name] DOB:   [birth year] MRN:   [id number]  ASSESSMENT AND PLAN: 1.  Lap band - 02/19/2012  Initial weight - 312, BMI - 46.3.  Today I added 1.2 cc fluid for a total of 4.0 cc in her lap band.  I will see her back in 10 weeks.  2.  GERD  Seems like she has had more trouble that she should with this.  Back on Prilosec 40 mg BID 3.  Hypertension 4.  History of bronchitis 5.  Sinus trouble - sinus surgery by Dr. Allegra Grana, about 04/10/2012 - she has done well with this 6.  Married Feb 2013  But her husband's only son committed suicide and he is struggling with this. 7.  Going to [employer] next week.  HISTORY OF PRESENT ILLNESS: Chief Complaint  Patient presents with  . Lap Band Fill   [name] is a 46 y.o. (DOB: Jun 02, 1966)  AA female who is a patient of DEWEY,ELIZABETH, MD and comes to me today for lap band follow up.  She is trying to do a lot of things right.  She has bought a "fit bit" and is using this to drive her activity.  She has bought a "Bullet blender" to make smoothies.  I warned her that liquids my not make her feel full and, therefore, tend to over eat.  We talked about exercise - she does ride a stationary bike.  She is taking her children to [facility] next week.  Lap Band History: The patient had sinus surgery by Dr. Allegra Grana on 04/10/2012. She had the smell of an old sweaty sock in her sinuses.  She used a netty pot to  Treat her sinuses with antibiotics. She is doing much better since completed his treatment and she saw Dr. Pollyann Kennedy for followup who is now signed off of her.  During this time she had significant esophageal spasm.  Dr. Duanne Guess gave her Omeprazole 40 mg BID, which has helped her esophagus and spasm.  It was during these episodes that she came to the [facility] with what was felt  to be her band too tight.  She said that she sat in the [facility] all night.  Dr. Daphine Deutscher removed all the fluid from the lap band.  She has had no further symptoms and gained about 7 pounds of weight.  The one concern is that the CT scan suggested a "slipped" lap band.  CT scan - 04/24/2012 - shows dilatation of the esophagus, but the lap band itself seems to be in a correct position.  PHYSICAL EXAM: BP 136/80  Pulse 62  Temp 98.6 F (37 C) (Temporal)  Resp 16  Ht 5\' 9"  (1.753 m)  Wt 265 lb (120.203 kg)  BMI 39.13 kg/m2  Chest:  Clear Abdomen: Soft.  Lap band port easy to feel.  No tenderness or mass.  Procedure:  I accessed her lap band.  She has around 2.8 cc in it, I added 1.2 cc for a total of 4.0 cc.  She tolerated water.  She is to stay on liquids for 2 days.  DATA REVIEWED: Old lap band fills.  Pamela Kin, MD, FACS Office:  [phone]

## 2012-10-05 ENCOUNTER — Ambulatory Visit (INDEPENDENT_AMBULATORY_CARE_PROVIDER_SITE_OTHER): Payer: BC Managed Care – PPO | Admitting: Surgery

## 2012-10-05 ENCOUNTER — Encounter (INDEPENDENT_AMBULATORY_CARE_PROVIDER_SITE_OTHER): Payer: Self-pay | Admitting: Surgery

## 2012-10-05 VITALS — BP 122/84 | HR 65 | Temp 98.6°F | Resp 18 | Ht 70.0 in | Wt 246.4 lb

## 2012-10-05 DIAGNOSIS — Z9884 Bariatric surgery status: Secondary | ICD-10-CM

## 2012-10-05 NOTE — Progress Notes (Signed)
[facility]  Pamela Kin, MD,  FACS [address] [place]  [address] [place], [place]    [address] Phone:  [phone] FAX:  [phone]   Re:   [name] DOB:   [birth year] MRN:   [id number]  ASSESSMENT AND PLAN: 1.  Lap band - 02/18/2009  Initial weight - 312, BMI - 46.3.  [name] is doing much better.  [name] has combined a "fit bit", a "Nutri-Bullet", and [name] is biking regularly (stationery) which helps clear her esophagus.  There was no adjustment today.  By her history, [name] may even be a little tight, but [name] is making it work.  [name] will see me back on her anniversary in July.  2.  GERD  Seems like [name] has had more trouble that [name] should with this.  Back on Prilosec 40 mg BID 3.  Hypertension 4.  History of bronchitis 5.  Sinus trouble - sinus surgery by Dr. Allegra Grana, about 04/10/2012 - [name] has done well with this 6.  Married Feb 2013  But her husband's only son committed suicide and he is struggling with this. 7.  Going to [place].  HISTORY OF PRESENT ILLNESS: Chief Complaint  Patient presents with  . Lap Band Fill    [name]   [name] is a 47 y.o. (DOB: [birth year])  AA female who is a patient of DEWEY,ELIZABETH, MD and comes to me today for lap band follow up.  [name] is doing a lot of things right.  [name] has a "fit bit" and is using this to drive her activity.  [name] has bought a " Nutri Bullet" blender to make smoothies.  [name] is riding her stationary bike about 10 miles per day.  And her weight is down about 14 pounds since her last visit.  We talked about [name], the [employer], which is going on tour.  We talked about the [employer].  [name] knew [name].  [name] has a round a bout way to get [employer].  Lap Band History: The patient had sinus surgery by Dr. Allegra Grana on 04/10/2012. [name] had the smell of an old sweaty sock in her sinuses.  [name] used a netty pot to  Treat her sinuses with antibiotics. [name] is doing much better  since completed his treatment and [name] saw Dr. Pollyann Kennedy for followup who is now signed off of her.  During this time [name] had significant esophageal spasm.  Dr. Duanne Guess gave her Omeprazole 40 mg BID, which has helped her esophagus and spasm.  It was during these episodes that [name] came to the [facility] with what was felt to be her band too tight.  [name] said that [name] sat in the [facility] all night.  Dr. Daphine Deutscher removed all the fluid from the lap band.  [name] has had no further symptoms and gained about 7 pounds of weight.  The one concern is that the CT scan suggested a "slipped" lap band.  CT scan - 04/24/2012 - shows dilatation of the esophagus, but the lap band itself seems to be in a correct position.  Social History: Married. Has two girls (10 and 12). Works for [employer].  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 We talked that [employer] has rights to a lot of [employer].  [name] went to [facility].  PHYSICAL EXAM: BP 122/84  Pulse 65  Temp(Src) 98.6 F (37 C) (Temporal)  Resp 18  Ht 5\' 10"  (  1.778 m)  Wt 246 lb 6.4 oz (111.766 kg)  BMI 35.35 kg/m2  Chest:  Clear Heart:  RRR Abdomen: Soft.  Lap band port easy to feel.  No tenderness or mass.  DATA REVIEWED: Old lap band fills.  Pamela Kin, MD, FACS Office:  [phone]

## 2013-01-06 ENCOUNTER — Encounter (INDEPENDENT_AMBULATORY_CARE_PROVIDER_SITE_OTHER): Payer: Self-pay | Admitting: Surgery

## 2013-03-30 ENCOUNTER — Ambulatory Visit (INDEPENDENT_AMBULATORY_CARE_PROVIDER_SITE_OTHER): Payer: BC Managed Care – PPO | Admitting: Surgery

## 2013-03-30 ENCOUNTER — Encounter (INDEPENDENT_AMBULATORY_CARE_PROVIDER_SITE_OTHER): Payer: Self-pay | Admitting: Surgery

## 2013-03-30 VITALS — BP 160/102 | HR 80 | Temp 98.4°F | Resp 15 | Ht 69.0 in | Wt 270.8 lb

## 2013-03-30 DIAGNOSIS — Z9884 Bariatric surgery status: Secondary | ICD-10-CM

## 2013-03-30 NOTE — Progress Notes (Signed)
[facility]  Ovidio Kin, MD,  FACS [address] [place]  [address] [place], [place]    [address] Phone:  [phone] FAX:  [phone]   Re:   [name] DOB:   [birth year] MRN:   [id number]  ASSESSMENT AND PLAN: 1.  Lap band - 02/18/2009  Initial weight - 312, BMI - 46.3.  She is not doing as well as she was in February, 2014.   Her right knee injury (about 2 months ago) has set her back.  This is still being treated and she is still having trouble.  I added 0.5 cc to 4.0 cc for a total of 4.5 cc  I or Mardelle Matte will see her back in 10 weeks.  2.  GERD  On Prilosec 40 mg BID.  About the same. 3.  Hypertension 4.  History of bronchitis 5.  Sinus trouble -  Sinus surgery by Dr. Allegra Grana, about 04/10/2012 - she has done well with this 6.  Married Feb 2013  But her husband's only son committed suicide in 2013 and he is struggling with this. 7.  Left knee injury  She is still dealing with this and this has slowed her physical activity down a lot.  I think this is a major contributor to her weight regain and her disposition.  HISTORY OF PRESENT ILLNESS: Chief Complaint  Patient presents with  . Lap Band Fill   [name] is a 47 y.o. (DOB: July 17, 1966)  AA female who is a patient of DEWEY,ELIZABETH, MD and comes to me today for lap band follow up.  I was late today by about an hour seeing her today.  She was frustrated by this. Also, she is frustrated because she has not done well since I last saw her.  She injured her left knee about 2 months ago.  She was placed on a taper pack of Prednisone for 10 days by [facility].  This really did not help.  The this past Tuesday, Dr. Duanne Guess injected her left knee.  It is too early to tell if she is any better. She has also had tramadol, which helps some.  She has not seen an orthopedic doctor yet. We talked about her diet.  She is eating eggs/cheese for breakfast, which she eats without difficulty.  She  thinks that he lap band is too loose and wants it tighter. She still is having the reflux.  Lap Band History: The patient had sinus surgery by Dr. Allegra Grana on 04/10/2012. She had the smell of an old sweaty sock in her sinuses.  She used a netty pot to  Treat her sinuses with antibiotics. She is doing much better since completed his treatment and she saw Dr. Pollyann Kennedy for followup who is now signed off of her.  During this time she had significant esophageal spasm.  Dr. Duanne Guess gave her Omeprazole 40 mg BID, which has helped her esophagus and spasm.  It was during these episodes that she came to the [facility] with what was felt to be her band too tight.  She said that she sat in the [facility] all night.  Dr. Daphine Deutscher removed all the fluid from the lap band.  She has had no further symptoms and gained about 7 pounds of weight.  The one concern is that the CT scan suggested a "slipped" lap band.  CT scan - 04/24/2012 - shows dilatation of the esophagus, but the lap band itself seems to be in  a correct position.  In February, 2014, she was doing better.   She had  combined a "fit bit", a "Nutri-Bullet", and she biking regularly (stationery) which helps clear her esophagus.  Social History: Married. Has two girls (10 and 12). Works for [employer].  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 We talked that [employer] has rights to a lot of [employer].  She went to [facility].  PHYSICAL EXAM: BP 160/102  Pulse 80  Temp(Src) 98.4 F (36.9 C) (Temporal)  Resp 15  Ht 5\' 9"  (1.753 m)  Wt 270 lb 12.8 oz (122.834 kg)  BMI 39.97 kg/m2  Chest:  Clear Heart:  RRR Abdomen: Soft.  Lap band port easy to feel.  No tenderness or mass.  Procedure:  She had 4.0 cc in her lap band.  I added 0.5 cc which was under some slight pressure.  She tolerated water well.  DATA REVIEWED: Old lap band fills.  Ovidio Kin, MD, FACS Office:  [phone]

## 2013-04-13 ENCOUNTER — Ambulatory Visit
Admission: RE | Admit: 2013-04-13 | Discharge: 2013-04-13 | Disposition: A | Discharge status: Home / Self Care | Payer: BC Managed Care – PPO | Source: Ambulatory Visit | Attending: Physician Assistant | Admitting: Physician Assistant

## 2013-04-13 ENCOUNTER — Encounter (INDEPENDENT_AMBULATORY_CARE_PROVIDER_SITE_OTHER): Payer: Self-pay

## 2013-04-13 ENCOUNTER — Telehealth (INDEPENDENT_AMBULATORY_CARE_PROVIDER_SITE_OTHER): Payer: Self-pay

## 2013-04-13 ENCOUNTER — Ambulatory Visit (INDEPENDENT_AMBULATORY_CARE_PROVIDER_SITE_OTHER): Payer: BC Managed Care – PPO | Admitting: Physician Assistant

## 2013-04-13 VITALS — BP 152/98 | HR 82 | Resp 16 | Ht 69.0 in | Wt 260.0 lb

## 2013-04-13 DIAGNOSIS — Z4651 Encounter for fitting and adjustment of gastric lap band: Secondary | ICD-10-CM

## 2013-04-13 DIAGNOSIS — R062 Wheezing: Secondary | ICD-10-CM

## 2013-04-13 NOTE — Telephone Encounter (Signed)
Mardelle Matte called patient to give her the results of her chest x-ray, left a voicemail for her to call back as soon as possible.

## 2013-04-13 NOTE — Patient Instructions (Signed)
Return in two weeks. Focus on good food choices as well as physical activity. Return sooner if you have an increase in hunger, portion sizes or weight. Return also for difficulty swallowing, night cough, reflux.   

## 2013-04-13 NOTE — Progress Notes (Signed)
  HISTORY: [name] is a 47 y.o.female who received an AP-Standard lap-band in July 2010 by Dr. Ezzard Standing. She comes in with two weeks of increasing night cough since addition of fluid by Dr. Ezzard Standing. Confounding the issue is increasing sinus drainage which occurred at the same time. She has no difficulty swallowing solid food but she wakes at night coughing and occasionally vomits. She also complains of wheezing. She was seen by her PCP who placed her on Augmentin for sinusitis. She was seen by an urgent care who placed her on beclomethazone and albuterol inhalers for her wheezing symptoms. She would like some fluid removed to remove one of the variables.  VITAL SIGNS: Filed Vitals:   04/13/13 0855  BP: 152/98  Pulse: 82  Resp: 16    PHYSICAL EXAM: Physical exam reveals a very well-appearing 47 y.o.female in no apparent distress Neurologic: Awake, alert, oriented Psych: Bright affect, conversant Respiratory: Breathing even and unlabored. She has bilateral expiratory wheezes, greater at the left apex. No crackles. Abdomen: Soft, nontender, nondistended to palpation. Incisions well-healed. No incisional hernias. Port easily palpated. Extremities: Atraumatic, good range of motion.  ASSESMENT: 47 y.o.  female  s/p AP-Standard lap-band.   PLAN: The patient's port was accessed with a 20G Huber needle without difficulty. Clear fluid was aspirated and 0.5 mL saline was removed from the port to give a total predicted volume of 4 mL. The patient was advised to concentrate on healthy food choices and to avoid slider foods high in fats and carbohydrates. I've ordered a chest x-ray to evaluate for pneumonia, even in the setting of Augmentin. We'll have her back in two weeks for re-evaluation and possible addition of fluid.

## 2013-04-27 ENCOUNTER — Ambulatory Visit (INDEPENDENT_AMBULATORY_CARE_PROVIDER_SITE_OTHER): Payer: Self-pay | Admitting: Physician Assistant

## 2013-04-27 ENCOUNTER — Encounter (INDEPENDENT_AMBULATORY_CARE_PROVIDER_SITE_OTHER): Payer: Self-pay

## 2013-04-27 VITALS — BP 120/90 | HR 84 | Resp 16 | Ht 69.0 in | Wt 262.6 lb

## 2013-04-27 DIAGNOSIS — Z4651 Encounter for fitting and adjustment of gastric lap band: Secondary | ICD-10-CM

## 2013-04-27 NOTE — Progress Notes (Signed)
  HISTORY: [name] is a 47 y.o.female who received an AP-Standard lap-band in July 2010 by Dr. Ezzard Standing. She comes in today with about 3 pounds of weight gain since her last visit 2 weeks ago. She had wheezing and a significant cough for which she was on Augmentin. A chest x-ray was obtained which showed suspicion of pneumonia. Her antibiotic was changed to levofloxacin and she is taking her last dose of this tomorrow. She has significant improvement of her symptoms. She has no further cough or wheezing. She does have a hoarse voice however. She has no difficulty swallowing and is eating more than desired. She also reports hunger. She would like a small fill today.Marland Kitchen  VITAL SIGNS: Filed Vitals:   04/27/13 0848  BP: 120/90  Pulse: 84  Resp: 16    PHYSICAL EXAM: Physical exam reveals a very well-appearing 47 y.o.female in no apparent distress Neurologic: Awake, alert, oriented Psych: Bright affect, conversant Respiratory: Breathing even and unlabored. No stridor or wheezing Abdomen: Soft, nontender, nondistended to palpation. Incisions well-healed. No incisional hernias. Port easily palpated. Extremities: Atraumatic, good range of motion.  ASSESMENT: 47 y.o.  female  s/p AP-Standard lap-band.   PLAN: The patient's port was accessed with a 20G Huber needle without difficulty. Clear fluid was aspirated and 0.25 mL saline was added to the port to give a total predicted volume of 4.25 mL. The patient was able to swallow water without difficulty following the procedure and was instructed to take clear liquids for the next 24-48 hours and advance slowly as tolerated.she will followup with ear nose and throat if she continues to have hoarseness. We will see her in one month.

## 2013-04-27 NOTE — Patient Instructions (Signed)

## 2013-05-02 ENCOUNTER — Encounter: Payer: Self-pay | Admitting: Obstetrics & Gynecology

## 2013-05-02 ENCOUNTER — Ambulatory Visit (INDEPENDENT_AMBULATORY_CARE_PROVIDER_SITE_OTHER): Payer: BC Managed Care – PPO | Admitting: Obstetrics & Gynecology

## 2013-05-02 VITALS — BP 142/90 | HR 60 | Resp 16 | Ht 68.5 in | Wt 264.4 lb

## 2013-05-02 DIAGNOSIS — Z01419 Encounter for gynecological examination (general) (routine) without abnormal findings: Secondary | ICD-10-CM

## 2013-05-02 NOTE — Progress Notes (Signed)
47 y.o. G2P1 MarriedAfrican AmericanF here for annual exam.  Daughters are 10 and 40.  Working on weight loss.  Had knee issue and that has kept her from exercising.  Did get married in the last 3 years.  Husband's son committed suicide and he really changed.  No vaginal bleeding.    Patient's last menstrual period was 08/10/2005.          Sexually active: yes  The current method of family planning is status post hysterectomy.    Exercising: yes  walking and bike riding Smoker:  no  Health Maintenance: Pap:  12/10/05 WNL History of abnormal Pap:  no MMG:  06/02/12 normal Colonoscopy:  2010 repeat in 5 years BMD:   none TDaP:  2008 Screening Labs: PCP, Hb today: PCP, Urine today: PCP   reports that she has never smoked. She has never used smokeless tobacco. She reports that she drinks about 0.5 ounces of alcohol per week. She reports that she does not use illicit drugs.  Past Medical History  Diagnosis Date  . Obesity   . Hypertension   . Uterine fibroid   . Anemia   . Bleeding from the nose   . Sinus congestion     saw ENT, had nasal surgery to clean out nasal area for bacteria, mold  . Vitamin D deficiency   . Adenomyosis 7/07  . H/O blood clots     superficial blood clots with OCPs    Past Surgical History  Procedure Laterality Date  . Nasal sinus surgery  4/13  . Laparoscopic gastric banding    . Abdominal hysterectomy    . Mouth surgery    . Cesarean section    . Breast biopsy      x2  . Laparoscopy      x2    Current Outpatient Prescriptions  Medication Sig Dispense Refill  . ALPRAZolam (XANAX) 0.5 MG tablet Take 0.5 mg by mouth 3 (three) times daily as needed. For anxiety      . amLODipine (NORVASC) 2.5 MG tablet Take 2.5 mg by mouth daily.      . Biotin 5000 MCG TABS Take 1 tablet by mouth daily.      Marland Kitchen escitalopram (LEXAPRO) 10 MG tablet Daily.      . Multiple Vitamin (MULITIVITAMIN WITH MINERALS) TABS Take 1 tablet by mouth daily.      Marland Kitchen  olmesartan-hydrochlorothiazide (BENICAR HCT) 40-25 MG per tablet Take 1 tablet by mouth daily.      Marland Kitchen omeprazole (PRILOSEC) 40 MG capsule Take 40 mg by mouth daily.      . fluticasone (FLONASE) 50 MCG/ACT nasal spray       . PROAIR HFA 108 (90 BASE) MCG/ACT inhaler        No current facility-administered medications for this visit.    Family History  Problem Relation Age of Onset  . Cancer Father     prostate and lung  . Diabetes Mother   . Diabetes Father   . Ovarian cancer Paternal Grandmother   . Hypertension Brother     x2  . Hypertension Mother   . Hypertension Father   . Hypothyroidism Brother   . Hypothyroidism Maternal Grandmother     ROS:  Pertinent items are noted in HPI.  Otherwise, a comprehensive ROS was negative.  Exam:   BP 142/90  Pulse 60  Resp 16  Ht 5' 8.5" (1.74 m)  Wt 264 lb 6.4 oz (119.931 kg)  BMI 39.61 kg/m2  LMP  08/10/2005  Weight change: -12 pounds  Height: 5' 8.5" (174 cm)  Ht Readings from Last 3 Encounters:  05/02/13 5' 8.5" (1.74 m)  04/27/13 5\' 9"  (1.753 m)  04/13/13 5\' 9"  (1.753 m)    General appearance: alert, cooperative and appears stated age Head: Normocephalic, without obvious abnormality, atraumatic Neck: no adenopathy, supple, symmetrical, trachea midline and thyroid normal to inspection and palpation Lungs: clear to auscultation bilaterally Breasts: normal appearance, no masses or tenderness Heart: regular rate and rhythm Abdomen: soft, non-tender; bowel sounds normal; no masses,  no organomegaly Extremities: extremities normal, atraumatic, no cyanosis or edema Skin: Skin color, texture, turgor normal. No rashes or lesions Lymph nodes: Cervical, supraclavicular, and axillary nodes normal. No abnormal inguinal nodes palpated Neurologic: Grossly normal   Pelvic: External genitalia:  no lesions              Urethra:  normal appearing urethra with no masses, tenderness or lesions              Bartholins and Skenes: normal                  Vagina: normal appearing vagina with normal color and discharge, no lesions              Cervix: absent              Pap taken: no Bimanual Exam:  Uterus:  uterus absent              Adnexa: normal adnexa and no mass, fullness, tenderness               Rectovaginal: Confirms               Anus:  normal sphincter tone, no lesions  A:  Well Woman with normal exam S/p TAH, ovaries remain H/O lap band.  Pt still desires to lose more weight. Sinus fungus causing pt to smell bad odor H/O superficial vein clots with OCPs  P:   Mammogram yearly pap smear not needed due to h/o TAH Labs with PCP return annually or prn  An After Visit Summary was printed and given to the patient.

## 2013-05-03 NOTE — Patient Instructions (Signed)

## 2013-05-25 ENCOUNTER — Encounter (INDEPENDENT_AMBULATORY_CARE_PROVIDER_SITE_OTHER): Payer: BC Managed Care – PPO

## 2013-06-01 ENCOUNTER — Encounter (INDEPENDENT_AMBULATORY_CARE_PROVIDER_SITE_OTHER): Payer: Self-pay

## 2013-06-01 ENCOUNTER — Ambulatory Visit (INDEPENDENT_AMBULATORY_CARE_PROVIDER_SITE_OTHER): Payer: BC Managed Care – PPO | Admitting: Physician Assistant

## 2013-06-01 VITALS — BP 142/96 | HR 80 | Temp 98.2°F | Resp 18 | Ht 69.0 in | Wt 265.4 lb

## 2013-06-01 DIAGNOSIS — R05 Cough: Secondary | ICD-10-CM

## 2013-06-01 DIAGNOSIS — Z9884 Bariatric surgery status: Secondary | ICD-10-CM

## 2013-06-01 NOTE — Patient Instructions (Signed)
Return in two weeks. Focus on good food choices as well as physical activity. Return after your upper GI or if your symptoms worsen.

## 2013-06-01 NOTE — Progress Notes (Signed)
  HISTORY: [name] is a 47 y.o.female who received an AP-Standard lap-band in July 2010 by Dr. Ezzard Standing. She comes in with slightly increased weight but a complaint of a fairly frequent cough, sometimes at night. She has no difficulty swallowing solids however. She feels hungry and feels capable of eating more than she should, which is causing her concern.  VITAL SIGNS: Filed Vitals:   06/01/13 1428  BP: 142/96  Pulse: 80  Temp: 98.2 F (36.8 C)  Resp: 18    PHYSICAL EXAM: Physical exam reveals a very well-appearing 47 y.o.female in no apparent distress Neurologic: Awake, alert, oriented Psych: Bright affect, conversant Respiratory: Breathing even and unlabored. No stridor or wheezing Extremities: Atraumatic, good range of motion. Skin: Warm, Dry, no rashes Musculoskeletal: Normal gait, Joints normal  ASSESMENT: 47 y.o.  female  s/p AP-Standard lap-band.   PLAN: We talked about the band as a possible source for her cough, but her tolerance of solids comes as a surprise. As such, I recommended an upper GI to evaluate the band and her upper tract. She voiced agreement. This may be a result of sinus drainage, but I would like to rule out the band as the source. We'll have her back after her x-ray.

## 2013-06-15 ENCOUNTER — Ambulatory Visit (INDEPENDENT_AMBULATORY_CARE_PROVIDER_SITE_OTHER): Payer: BC Managed Care – PPO | Admitting: Physician Assistant

## 2013-06-15 ENCOUNTER — Ambulatory Visit
Admission: RE | Admit: 2013-06-15 | Discharge: 2013-06-15 | Disposition: A | Discharge status: Home / Self Care | Payer: Self-pay | Source: Ambulatory Visit | Attending: Physician Assistant | Admitting: Physician Assistant

## 2013-06-15 ENCOUNTER — Encounter (INDEPENDENT_AMBULATORY_CARE_PROVIDER_SITE_OTHER): Payer: Self-pay

## 2013-06-15 VITALS — BP 120/76 | HR 68 | Temp 97.2°F | Resp 16 | Ht 69.0 in | Wt 263.8 lb

## 2013-06-15 DIAGNOSIS — R05 Cough: Secondary | ICD-10-CM

## 2013-06-15 DIAGNOSIS — Z4651 Encounter for fitting and adjustment of gastric lap band: Secondary | ICD-10-CM

## 2013-06-15 DIAGNOSIS — Z9884 Bariatric surgery status: Secondary | ICD-10-CM

## 2013-06-15 NOTE — Patient Instructions (Signed)
Return to see Dr. Ezzard Standing. Focus on good food choices as well as physical activity. Return sooner if you have an increase in hunger, portion sizes or weight. Return also for difficulty swallowing, night cough, reflux.

## 2013-06-15 NOTE — Progress Notes (Signed)
  HISTORY: [name] is a 47 y.o.female who received an AP-Standard lap-band in July 2010 by Dr. Ezzard Standing. She comes in for review of upper GI results obtained today. She has a continued history of occasional night cough. She also describes burping up of undigested food up to 4 hours after eating (usually while sitting at her desk). Her results show a dilated esophagus with scant passage of contrast past the band, but no evidence of overt slip.  VITAL SIGNS: Filed Vitals:   06/15/13 0937  BP: 120/76  Pulse: 68  Temp: 97.2 F (36.2 C)  Resp: 16    PHYSICAL EXAM: Physical exam reveals a very well-appearing 47 y.o.female in no apparent distress Neurologic: Awake, alert, oriented Psych: Bright affect, conversant Respiratory: Breathing even and unlabored. No stridor or wheezing Abdomen: Soft, nontender, nondistended to palpation. Incisions well-healed. No incisional hernias. Port easily palpated. Extremities: Atraumatic, good range of motion.  ASSESMENT: 47 y.o.  female  s/p AP-Standard lap-band.   PLAN: I reviewed these results with the patient and Dr. Biagio Quint. We decided the best course of action is to remove some fluid as she's likely trapping solid food in the distal esophagus as evidenced by the dilation. The patient agreed with this course. The patient's port was accessed with a 20G Huber needle without difficulty. Clear fluid was aspirated and 2 mL saline was removed from the port to give a total predicted volume of 2.5 mL. The patient was advised to concentrate on healthy food choices and to avoid slider foods high in fats and carbohydrates. I've asked her to see Dr. Ezzard Standing for her next appointment to check on her progress.

## 2013-08-02 ENCOUNTER — Encounter (INDEPENDENT_AMBULATORY_CARE_PROVIDER_SITE_OTHER): Payer: Self-pay | Admitting: Surgery

## 2013-08-02 ENCOUNTER — Ambulatory Visit (INDEPENDENT_AMBULATORY_CARE_PROVIDER_SITE_OTHER): Payer: BC Managed Care – PPO | Admitting: Surgery

## 2013-08-02 ENCOUNTER — Encounter (INDEPENDENT_AMBULATORY_CARE_PROVIDER_SITE_OTHER): Payer: Self-pay

## 2013-08-02 VITALS — BP 130/100 | HR 80 | Temp 98.6°F | Resp 15 | Ht 69.0 in | Wt 277.0 lb

## 2013-08-02 DIAGNOSIS — Z6841 Body Mass Index (BMI) 40.0 and over, adult: Secondary | ICD-10-CM

## 2013-08-02 DIAGNOSIS — Z9884 Bariatric surgery status: Secondary | ICD-10-CM

## 2013-08-02 NOTE — Progress Notes (Signed)
[facility]  Ovidio Kin, MD,  FACS [address] [place]  [address] [place], [place]    [address] Phone:  [phone] FAX:  [phone]   Re:   [name] DOB:   [birth year] MRN:   [id number]  ASSESSMENT AND PLAN: 1.  Lap band - 02/18/2009  Initial weight - 312, BMI - 46.3.  She saw Mardelle Matte 06/15/2013.  An UGI 06/15/2013 showed poor esophageal empting with some esophageal dilitation.  Andy removed 2.0 cc for a estimated amount in lap band of 2.25 cc.  I added 1.0 cc today, for a total of 3.25 cc.  She'll see me or Mardelle Matte back in 10 weeks.  2.  GERD  On Dexilent 3.  Hypertension 4.  History of bronchitis 5.  Sinus trouble -  Sinus surgery by Dr. Allegra Grana, about 04/10/2012 - she has done well with this 6.  Married Feb 2013 - divorced November 2014  But former her husband's only son committed suicide in 2013 and he has struggled with this. 7.  Left knee injury  She sees Dr. Rayann Heman at [facility].  She is a little frustrated because she is having trouble finding an activity that can get enough time doing.  HISTORY OF PRESENT ILLNESS: Chief Complaint  Patient presents with  . Lap Band Fill   [name] is a 47 y.o. (DOB: 1965-11-02)  AA female who is a patient of DEWEY,ELIZABETH, MD and comes to me today for lap band follow up.  Her reflux is better.  She has had retrosternal pain and that is better since some of the fluid has been removed the Lap Band.  But she has gained over 10 pounds since [place] removed the fluid. Dr. Duanne Guess switched her to [facility] for her reflux symptoms and that has helped her.  The symptoms improvement may coincide some with removing the fluid from the lap band. She is struggling getting her exercise in.  She was doing well, but now she fees very limited in what she can do. She got divorced in November 2014 and we talked about that. We decided to put in about 1/2 of what Mardelle Matte removed from the lap band.  Lap Band History: The  patient had sinus surgery by Dr. Allegra Grana on 04/10/2012. She had the smell of an old sweaty sock in her sinuses.  She used a netty pot to  Treat her sinuses with antibiotics. She is doing much better since completed his treatment and she saw Dr. Pollyann Kennedy for followup who is now signed off of her.  During this time she had significant esophageal spasm.  Dr. Duanne Guess gave her Omeprazole 40 mg BID, which has helped her esophagus and spasm.  It was during these episodes that she came to the [facility] with what was felt to be her band too tight.  She said that she sat in the [facility] all night.  Dr. Daphine Deutscher removed all the fluid from the lap band.  She has had no further symptoms and gained about 7 pounds of weight.  The one concern is that the CT scan suggested a "slipped" lap band.  CT scan - 04/24/2012 - shows dilatation of the esophagus, but the lap band itself seems to be in a correct position.  In February, 2014, she was doing better.   She had  combined a "fit bit", a "Nutri-Bullet", and she biking regularly (stationery) which helps clear her esophagus.  Social History: Divorced November 2014. Has two girls (  10 and 12 as of 2014). Works for [employer].  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 We talked that [employer] has rights to a lot of [employer].  She went to [facility].  PHYSICAL EXAM: BP 130/100  Pulse 80  Temp(Src) 98.6 F (37 C) (Temporal)  Resp 15  Ht 5\' 9"  (1.753 m)  Wt 277 lb (125.646 kg)  BMI 40.89 kg/m2  LMP 08/10/2005  Chest:  Clear Heart:  RRR Abdomen: Soft.  Lap band port easy to feel.  No tenderness or mass.  The port is rotated cranially.  Procedure:  She had 2.2 cc in her lap band. I checked this amount by aspiration.  I added 1.0 cc for a total of 3.2 cc.  She tolerated water well.  DATA REVIEWED: Epic notes.  Ovidio Kin, MD, FACS Office:  [phone]

## 2013-10-05 ENCOUNTER — Encounter (INDEPENDENT_AMBULATORY_CARE_PROVIDER_SITE_OTHER): Payer: BC Managed Care – PPO | Admitting: Surgery

## 2013-10-19 ENCOUNTER — Encounter (INDEPENDENT_AMBULATORY_CARE_PROVIDER_SITE_OTHER): Payer: Self-pay

## 2013-10-19 ENCOUNTER — Ambulatory Visit (INDEPENDENT_AMBULATORY_CARE_PROVIDER_SITE_OTHER): Payer: BC Managed Care – PPO | Admitting: Physician Assistant

## 2013-10-19 VITALS — BP 138/82 | HR 88 | Temp 98.7°F | Resp 16 | Ht 69.0 in | Wt 282.6 lb

## 2013-10-19 DIAGNOSIS — Z4651 Encounter for fitting and adjustment of gastric lap band: Secondary | ICD-10-CM

## 2013-10-19 NOTE — Patient Instructions (Signed)

## 2013-10-19 NOTE — Progress Notes (Signed)
  HISTORY: [name] is a 48 y.o.female who received an AP-Standard lap-band in July 2010 by Dr. Lucia Gaskins. She was last seen on Christmas Eve when Dr. Lucia Gaskins did a 1 mL fill. She's gained close to 6 lbs since then due to larger portion sizes and persistent hunger. Her knees have improved somewhat so she's begun increasing her activity. Her band is up to 3.25 mL but she still feels little restriction. She has no regurgitation or reflux.  VITAL SIGNS: Filed Vitals:   10/19/13 1634  BP: 138/82  Pulse: 88  Temp: 98.7 F (37.1 C)  Resp: 16    PHYSICAL EXAM: Physical exam reveals a very well-appearing 48 y.o.female in no apparent distress Neurologic: Awake, alert, oriented Psych: Bright affect, conversant Respiratory: Breathing even and unlabored. No stridor or wheezing Abdomen: Soft, nontender, nondistended to palpation. Incisions well-healed. No incisional hernias. Port easily palpated. Extremities: Atraumatic, good range of motion.  ASSESMENT: 48 y.o.  female  s/p AP-Standard lap-band.   PLAN: The patient's port was accessed with a 20G Huber needle without difficulty. Clear fluid was aspirated and 0.75 mL saline was added to the port to give a total predicted volume of 4 mL. The patient was able to swallow water without difficulty following the procedure and was instructed to take clear liquids for the next 24-48 hours and advance slowly as tolerated.

## 2013-11-30 ENCOUNTER — Encounter (INDEPENDENT_AMBULATORY_CARE_PROVIDER_SITE_OTHER): Payer: BC Managed Care – PPO

## 2013-11-30 ENCOUNTER — Encounter (INDEPENDENT_AMBULATORY_CARE_PROVIDER_SITE_OTHER): Payer: Self-pay

## 2013-11-30 ENCOUNTER — Ambulatory Visit (INDEPENDENT_AMBULATORY_CARE_PROVIDER_SITE_OTHER): Payer: BC Managed Care – PPO | Admitting: Physician Assistant

## 2013-11-30 DIAGNOSIS — Z9884 Bariatric surgery status: Secondary | ICD-10-CM

## 2013-11-30 NOTE — Progress Notes (Signed)
  HISTORY: [name] is a 48 y.o.female who received an AP-Standard lap-band in July 2010 by Dr. Lucia Gaskins. She comes in with 2.5 lbs weight loss since her last visit when we filled her band to a total of 4 mL. Since then she's developed an upper respiratory ailment rendering her with a cough. She comes in questioning involvement of the lap band with her current problem. She denies dysphagia unless she overeats. She does not waken at night coughing nor does she have any reflux symptoms. She is continuing to exercise despite some joint pain.  VITAL SIGNS: Filed Vitals:   11/30/13 1344  BP: 140/96  Pulse: 72  Temp: 98.6 F (37 C)  Resp: 14    PHYSICAL EXAM: Physical exam reveals a very well-appearing 48 y.o.female in no apparent distress Neurologic: Awake, alert, oriented Psych: Bright affect, conversant Respiratory: Breathing even and unlabored. No stridor or wheezing Extremities: Atraumatic, good range of motion. Skin: Warm, Dry, no rashes Musculoskeletal: Normal gait, Joints normal  ASSESMENT: 48 y.o.  female  s/p AP-Standard lap-band.   PLAN: As she doesn't have clear signs of obstruction or nocturnal reflux, and she describes having recent sick contacts, this cough is unlikely secondary to the band. She doesn't want a fill today as she'd like to get the pulmonary issues cleared up first, then return to see [place] in three months or so for re-evaluation.

## 2013-11-30 NOTE — Patient Instructions (Signed)
Return in three months. Focus on good food choices as well as physical activity. Return sooner if you have an increase in hunger, portion sizes or weight. Return also for difficulty swallowing, night cough, reflux.   

## 2014-03-01 ENCOUNTER — Encounter (INDEPENDENT_AMBULATORY_CARE_PROVIDER_SITE_OTHER): Payer: Self-pay

## 2014-03-01 ENCOUNTER — Ambulatory Visit (INDEPENDENT_AMBULATORY_CARE_PROVIDER_SITE_OTHER): Payer: BC Managed Care – PPO | Admitting: Physician Assistant

## 2014-03-01 VITALS — BP 130/90 | HR 72 | Temp 98.6°F | Resp 14 | Ht 69.0 in | Wt 281.2 lb

## 2014-03-01 DIAGNOSIS — Z4651 Encounter for fitting and adjustment of gastric lap band: Secondary | ICD-10-CM

## 2014-03-01 NOTE — Progress Notes (Signed)
  HISTORY: [name] is a 48 y.o.female who received an AP-Standard lap-band in July 2010 by Dr. Lucia Gaskins. The patient has gained 1 lbs since their last visit in April, and has lost 32 lbs since surgery. She has been exercising aggressively and has noted her clothing fitting much more loosely despite no real change in weight. She has also noted an increase in hunger and larger than desired portion sizes. She has no complaint of regurgitation or reflux. She would like a small fill today to help with this, but she's also battling another sinus infection. She's currently taking Augmentin.  VITAL SIGNS: Filed Vitals:   03/01/14 1338  BP: 130/90  Pulse: 72  Temp: 98.6 F (37 C)  Resp: 14    PHYSICAL EXAM: Physical exam reveals a very well-appearing 48 y.o.female in no apparent distress Neurologic: Awake, alert, oriented Psych: Bright affect, conversant Respiratory: Breathing even and unlabored. No stridor or wheezing Abdomen: Soft, nontender, nondistended to palpation. Incisions well-healed. No incisional hernias. Port easily palpated. Extremities: Atraumatic, good range of motion.  ASSESMENT: 48 y.o.  female  s/p AP-Standard lap-band.   PLAN: The patient's port was accessed with a 20G Huber needle without difficulty. Clear fluid was aspirated and 0.25 mL saline was added to the port to give a total predicted volume of 4.25 mL. The patient was able to swallow water without difficulty following the procedure and was instructed to take clear liquids for the next 24-48 hours and advance slowly as tolerated. We'll have her back in three months or sooner if needed.

## 2014-03-01 NOTE — Patient Instructions (Signed)

## 2014-04-05 ENCOUNTER — Ambulatory Visit
Admission: RE | Admit: 2014-04-05 | Discharge: 2014-04-05 | Disposition: A | Discharge status: Home / Self Care | Payer: BC Managed Care – PPO | Source: Ambulatory Visit | Attending: Otolaryngology | Admitting: Otolaryngology

## 2014-04-05 ENCOUNTER — Other Ambulatory Visit: Payer: Self-pay | Admitting: Otolaryngology

## 2014-04-05 DIAGNOSIS — J320 Chronic maxillary sinusitis: Secondary | ICD-10-CM

## 2014-05-09 ENCOUNTER — Ambulatory Visit (INDEPENDENT_AMBULATORY_CARE_PROVIDER_SITE_OTHER): Payer: BC Managed Care – PPO | Admitting: Neurology

## 2014-05-09 ENCOUNTER — Encounter: Payer: Self-pay | Admitting: Neurology

## 2014-05-09 ENCOUNTER — Encounter (INDEPENDENT_AMBULATORY_CARE_PROVIDER_SITE_OTHER): Payer: Self-pay

## 2014-05-09 VITALS — BP 126/82 | HR 84 | Ht 70.25 in | Wt 269.0 lb

## 2014-05-09 DIAGNOSIS — G4733 Obstructive sleep apnea (adult) (pediatric): Secondary | ICD-10-CM

## 2014-05-09 DIAGNOSIS — G478 Other sleep disorders: Secondary | ICD-10-CM

## 2014-05-09 DIAGNOSIS — I4891 Unspecified atrial fibrillation: Secondary | ICD-10-CM

## 2014-05-09 DIAGNOSIS — E669 Obesity, unspecified: Secondary | ICD-10-CM

## 2014-05-09 NOTE — Patient Instructions (Addendum)
Based on your symptoms and your exam I believe you are at risk for obstructive sleep apnea or OSA, and I think we should proceed with a sleep study to determine whether you do or do not have OSA and how severe it is. If you have more than mild OSA, I want you to consider treatment with CPAP. Please remember, the risks and ramifications of moderate to severe obstructive sleep apnea or OSA are: Cardiovascular disease, including congestive heart failure, stroke, difficult to control hypertension, arrhythmias, and even type 2 diabetes has been linked to untreated OSA. Sleep apnea causes disruption of sleep and sleep deprivation in most cases, which, in turn, can cause recurrent headaches, problems with memory, mood, concentration, focus, and vigilance. Most people with untreated sleep apnea report excessive daytime sleepiness, which can affect their ability to drive. Please do not drive if you feel sleepy.  I will see you back after your sleep study to go over the test results and where to go from there. We will call you after your sleep study and to set up an appointment at the time.   You may use melatonin for the sleep study, but remember to bring it!

## 2014-05-09 NOTE — Progress Notes (Signed)
Subjective:    [name] is a 48 y.o. female.  HPI    Star Age, MD, PhD [facility] [address], [address] [place], [place] [address]  Dear Ulice Dash,   I saw your patient, [name], upon your kind request in my neurologic clinic today for initial consultation of her sleep disorder, in particular, concern for underlying obstructive sleep apnea. The patient is unaccompanied today. As you know, [name] is a 48 year old right-handed woman with an underlying medical history of hypertension, allergies, morbid obesity, status post lap band surgery in July 2010, gastroesophageal reflux disease, chronic sinus disease, endometriosis, who was recently diagnosed with new onset atrial fibrillation earlier this month. She is scheduled for an echocardiogram as well as a myocardial SPECT test. She reports snoring and complains of daytime tiredness. 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 She has been told, that she has apneic pauses in sleep. She has occasional nocturia, and has no morning headaches.  She has reverted back to sinus rhythm. She reports feeling marginally rested upon awakening. She wakes up on an average 4 times in the middle of the night and has to go to the bathroom 1 time per night, but not every night. She denies morning headaches.  She denies excessive daytime somnolence (EDS) and Her Epworth Sleepiness Score (ESS) is 2/24 today. She has not fallen asleep while driving. The patient has not been taking a planned nap.  She has been known to snore for the past years. Snoring is reportedly moderate, and associated with choking sounds and witnessed apneas. The patient denies a sense of choking or strangling feeling. There is no report of nighttime reflux, with occasional nighttime cough experienced. The patient has not noted any RLS symptoms and is not known to kick while asleep or before falling asleep. There is family  history of OSA in her father and possibly her mother.  She is a restless sleeper and in the morning, the bed is quite disheveled.   She denies cataplexy, sleep paralysis, hypnagogic or hypnopompic hallucinations, or sleep attacks. She does not report any vivid dreams, nightmares, dream enactments, or parasomnias, such as sleep talking or sleep walking. The patient has not had a sleep study or a home sleep test.  She consumes 2 caffeinated beverages per day, usually in the form of sodas.   Her Past Medical History Is Significant For: Past Medical History  Diagnosis Date  . Obesity   . Hypertension   . Uterine fibroid   . Anemia   . Bleeding from the nose   . Sinus congestion     saw ENT, had nasal surgery to clean out nasal area for bacteria, mold  . Vitamin D deficiency   . Adenomyosis 7/07  . H/O blood clots     superficial blood clots with OCPs    His Past Surgical History Is Significant For: Past Surgical History  Procedure Laterality Date  . Nasal sinus surgery  4/13  . Laparoscopic gastric banding    . Abdominal hysterectomy    . Mouth surgery    . Cesarean section    . Breast biopsy      x2  . Laparoscopy      x2    Her Family History Is Significant For: Family History  Problem Relation Age of Onset  . Cancer Father     prostate and lung  . Diabetes Father   . Hypertension Father   .  Diabetes Mother   . Hypertension Mother   . Ovarian cancer Paternal Grandmother   . Hypertension Brother     x2  . Hypothyroidism Brother   . Hypothyroidism Maternal Grandmother     Her Social History Is Significant For: History   Social History  . Marital Status: Divorced    Spouse Name: N/A    Number of Children: 2  . Years of Education: MASTERS   Occupational History  .     Social History Main Topics  . Smoking status: Never Smoker   . Smokeless tobacco: Never Used  . Alcohol Use: 0.5 - 1.0 oz/week    1-2 drink(s) per week     Comment: OCC  . Drug Use: No  .  Sexual Activity: Yes    Partners: Male    Birth Control/ Protection: Surgical     Comment: TAH   Other Topics Concern  . None   Social History Narrative   Patient is divorced with 2 children.   Patient is right handed.   Patient has her Master's degree.   Patient drinks 1-2 sodas daily.        Her Allergies Are:  Allergies  Allergen Reactions  . Flagyl [Metronidazole] Anaphylaxis  :   Her Current Medications Are:  Outpatient Encounter Prescriptions as of 05/09/2014  Medication Sig  . ALPRAZolam (XANAX) 0.5 MG tablet Take 0.5 mg by mouth 3 (three) times daily as needed. For anxiety  . amLODipine (NORVASC) 2.5 MG tablet Take 2.5 mg by mouth daily.  . AMRIX 15 MG 24 hr capsule Take 15 mg by mouth daily.  Marland Kitchen CARTIA XT 180 MG 24 hr capsule Take 180 mg by mouth daily.  Marland Kitchen DEXILANT 60 MG capsule   . escitalopram (LEXAPRO) 20 MG tablet Take 20 mg by mouth daily.  . fluticasone (FLONASE) 50 MCG/ACT nasal spray   . HYDROcodone-acetaminophen (NORCO/VICODIN) 5-325 MG per tablet Take by mouth daily.  Marland Kitchen olmesartan-hydrochlorothiazide (BENICAR HCT) 40-25 MG per tablet Take 1 tablet by mouth daily.  Marland Kitchen PROAIR HFA 108 (90 BASE) MCG/ACT inhaler   . SAVAYSA 60 MG TABS tablet Take 60 mg by mouth daily.  . [DISCONTINUED] Biotin 5000 MCG TABS Take 1 tablet by mouth daily.  . [DISCONTINUED] escitalopram (LEXAPRO) 10 MG tablet Daily.  . [DISCONTINUED] Multiple Vitamin (MULITIVITAMIN WITH MINERALS) TABS Take 1 tablet by mouth daily.  :  Review of Systems:  Out of a complete 14 point review of systems, all are reviewed and negative with the exception of these symptoms as listed below:   Review of Systems  Constitutional: Negative.   HENT: Negative.   Eyes: Negative.   Respiratory: Negative.   Cardiovascular: Negative.   Gastrointestinal: Negative.   Endocrine: Negative.   Genitourinary: Negative.   Musculoskeletal: Negative.   Skin: Negative.   Allergic/Immunologic: Negative.    Neurological: Negative.   Hematological: Negative.   Psychiatric/Behavioral: Positive for sleep disturbance (snoring and insomnia).    Objective:  Neurologic Exam  Physical Exam Physical Examination:   Filed Vitals:   05/09/14 1156  BP: 126/82  Pulse: 84    General Examination: The patient is a very pleasant 47 y.o. female in no acute distress. She appears well-developed and well-nourished and well groomed. She is obese.   HEENT: Normocephalic, atraumatic, pupils are equal, round and reactive to light and accommodation. Funduscopic exam is normal with sharp disc margins noted. Extraocular tracking is good without limitation to gaze excursion or nystagmus noted. Normal smooth pursuit is noted.  Hearing is grossly intact. Tympanic membranes are clear bilaterally. Face is symmetric with normal facial animation and normal facial sensation. Speech is clear with no dysarthria noted. There is no hypophonia. There is no lip, neck/head, jaw or voice tremor. Neck is supple with full range of passive and active motion. There are no carotid bruits on auscultation. Oropharynx exam reveals: mild mouth dryness, adequate dental hygiene and moderate airway crowding, due to redundant soft palate, longer uvula, and larger tongue. Mallampati is class II. Tongue protrudes centrally and palate elevates symmetrically. Tonsils are 1+ in size/absent. Neck size is 15 inches. She has a Mild overbite. Nasal inspection reveals no significant nasal mucosal bogginess or redness and no septal deviation.   Chest: Clear to auscultation without wheezing, rhonchi or crackles noted.  Heart: S1+S2+0, regular and normal without murmurs, rubs or gallops noted.   Abdomen: Soft, non-tender and non-distended with normal bowel sounds appreciated on auscultation.  Extremities: There is no pitting edema in the distal lower extremities bilaterally. Pedal pulses are intact.  Skin: Warm and dry without trophic changes noted. There are  no varicose veins.  Musculoskeletal: exam reveals no obvious joint deformities, tenderness or joint swelling or erythema.   Neurologically:  Mental status: The patient is awake, alert and oriented in all 4 spheres. Her immediate and remote memory, attention, language skills and fund of knowledge are appropriate. There is no evidence of aphasia, agnosia, apraxia or anomia. Speech is clear with normal prosody and enunciation. Thought process is linear. Mood is normal and affect is normal.  Cranial nerves II - XII are as described above under HEENT exam. In addition: shoulder shrug is normal with equal shoulder height noted. Motor exam: Normal bulk, strength and tone is noted. There is no drift, tremor or rebound. Romberg is negative. Reflexes are 2+ throughout. Babinski: Toes are flexor bilaterally. Fine motor skills and coordination: intact with normal finger taps, normal hand movements, normal rapid alternating patting, normal foot taps and normal foot agility.  Cerebellar testing: No dysmetria or intention tremor on finger to nose testing. Heel to shin is unremarkable bilaterally. There is no truncal or gait ataxia.  Sensory exam: intact to light touch, pinprick, vibration, temperature sense in the upper and lower extremities.  Gait, station and balance: She stands easily. No veering to one side is noted. No leaning to one side is noted. Posture is age-appropriate and stance is narrow based. Gait shows normal stride length and normal pace. No problems turning are noted. She turns en bloc.          Assessment and Plan:  In summary, [name] is a very pleasant 48 y.o.-year old female with an underlying medical history of hypertension, allergies, morbid obesity, status post lap band surgery in July 2010, gastroesophageal reflux disease, chronic sinus disease, endometriosis, who was recently diagnosed with new onset atrial fibrillation. She reports a history of snoring and witnessed apneas. She has  nonrestorative sleep and sleep disruption. Her history and physical exam are indeed concerning for obstructive sleep apnea (OSA). I had a long chat with the patient about my findings and the diagnosis of OSA, its prognosis and treatment options. We talked about medical treatments, surgical interventions and non-pharmacological approaches. I explained in particular the risks and ramifications of untreated moderate to severe OSA, especially with respect to developing cardiovascular disease down the Road, including congestive heart failure, difficult to treat hypertension, cardiac arrhythmias, or stroke. Even type 2 diabetes has, in part, been linked to untreated OSA.  Symptoms of untreated OSA include daytime sleepiness, memory problems, mood irritability and mood disorder such as depression and anxiety, lack of energy, as well as recurrent headaches, especially morning headaches. We talked about trying to maintain a healthy lifestyle in general, as well as the importance of weight control. I encouraged the patient to eat healthy, exercise daily and keep well hydrated, to keep a scheduled bedtime and wake time routine, to not skip any meals and eat healthy snacks in between meals. I advised the patient not to drive when feeling sleepy. I recommended the following at this time: sleep study with potential positive airway pressure titration. (We will score hypopneas at 3% and split the sleep study into diagnostic and treatment portion, if the estimated. 2 hour AHI is >15/h).   I explained the sleep test procedure to the patient and also outlined possible surgical and non-surgical treatment options of OSA, including the use of a custom-made dental device (which would require a referral to a specialist dentist or oral surgeon), upper airway surgical options, such as pillar implants, radiofrequency surgery, tongue base surgery, and UPPP (which would involve a referral to an ENT surgeon). Rarely, jaw surgery such as  mandibular advancement may be considered.  I also explained the CPAP treatment option to the patient, who indicated that she would be willing to try CPAP if the need arises. I explained the importance of being compliant with PAP treatment, not only for insurance purposes but primarily to improve Her symptoms, and for the patient's long term health benefit, including to reduce Her cardiovascular risks. I answered all her questions today and the patient was in agreement. I would like to see her back after the sleep study is completed and encouraged her to call with any interim questions, concerns, problems or updates.   Thank you very much for allowing me to participate in the care of this nice patient. If I can be of any further assistance to you please do not hesitate to call me at [phone].  Sincerely,   Star Age, MD, PhD

## 2014-05-10 ENCOUNTER — Other Ambulatory Visit: Payer: Self-pay | Admitting: Gastroenterology

## 2014-05-10 DIAGNOSIS — R1011 Right upper quadrant pain: Secondary | ICD-10-CM

## 2014-05-25 ENCOUNTER — Ambulatory Visit (HOSPITAL_COMMUNITY)
Admission: RE | Admit: 2014-05-25 | Discharge: 2014-05-25 | Disposition: A | Discharge status: Home / Self Care | Payer: BC Managed Care – PPO | Source: Ambulatory Visit | Attending: Gastroenterology | Admitting: Gastroenterology

## 2014-05-25 ENCOUNTER — Encounter (HOSPITAL_COMMUNITY)
Admission: RE | Admit: 2014-05-25 | Discharge: 2014-05-25 | Disposition: A | Discharge status: Home / Self Care | Payer: BC Managed Care – PPO | Source: Ambulatory Visit | Attending: Gastroenterology | Admitting: Gastroenterology

## 2014-05-25 DIAGNOSIS — R1011 Right upper quadrant pain: Secondary | ICD-10-CM | POA: Insufficient documentation

## 2014-05-25 MED ORDER — STERILE WATER FOR INJECTION IJ SOLN
INTRAMUSCULAR | Status: AC
  Filled 2014-05-25: qty 10

## 2014-05-25 MED ORDER — SINCALIDE 5 MCG IJ SOLR
INTRAMUSCULAR | Status: AC
  Administered 2014-05-25: 2.45 ug via INTRAVENOUS
  Filled 2014-05-25: qty 5

## 2014-05-25 MED ORDER — SINCALIDE 5 MCG IJ SOLR
INTRAMUSCULAR | Status: AC
  Filled 2014-05-25: qty 5

## 2014-05-25 MED ORDER — SINCALIDE 5 MCG IJ SOLR
0.0200 ug/kg | Freq: Once | INTRAMUSCULAR | Status: AC
  Administered 2014-05-25: 2.45 ug via INTRAVENOUS

## 2014-05-25 MED ORDER — TECHNETIUM TC 99M MEBROFENIN IV KIT
5.0000 | PACK | Freq: Once | INTRAVENOUS | Status: AC | PRN
  Administered 2014-05-25: 5 via INTRAVENOUS

## 2014-05-31 ENCOUNTER — Encounter (INDEPENDENT_AMBULATORY_CARE_PROVIDER_SITE_OTHER): Payer: BC Managed Care – PPO

## 2014-06-11 ENCOUNTER — Encounter: Payer: Self-pay | Admitting: Neurology

## 2014-06-12 ENCOUNTER — Ambulatory Visit (INDEPENDENT_AMBULATORY_CARE_PROVIDER_SITE_OTHER): Payer: BC Managed Care – PPO | Admitting: Neurology

## 2014-06-12 DIAGNOSIS — G4733 Obstructive sleep apnea (adult) (pediatric): Secondary | ICD-10-CM

## 2014-06-12 DIAGNOSIS — G479 Sleep disorder, unspecified: Secondary | ICD-10-CM

## 2014-06-12 NOTE — Sleep Study (Signed)
Please see the scanned sleep study interpretation located in the Procedure tab within the Chart Review section. 

## 2014-06-22 ENCOUNTER — Telehealth: Payer: Self-pay | Admitting: Neurology

## 2014-06-22 DIAGNOSIS — G4733 Obstructive sleep apnea (adult) (pediatric): Secondary | ICD-10-CM

## 2014-06-22 NOTE — Telephone Encounter (Signed)
Please call and notify patient that the recent sleep study confirmed the diagnosis of OSA. She did well with CPAP during the study with significant improvement of the respiratory events. Therefore, I would like start the patient on CPAP at home. I placed the order in the chart.   Arrange for CPAP set up at home through a DME company of patient's choice and fax/route report to PCP and referring MD (if other than PCP).   The patient will also need a follow up appointment with me in 6-8 weeks post set up that has to be scheduled; help the patient schedule this (in a follow-up slot).   Please re-enforce the importance of compliance with treatment and the need for [place] to monitor compliance data.   Once you have spoken to the patient and scheduled the return appointment, you may close this encounter, thanks,   Star Age, MD, PhD [facility] ([place]

## 2014-06-25 ENCOUNTER — Encounter: Payer: Self-pay | Admitting: *Deleted

## 2014-06-25 ENCOUNTER — Encounter: Payer: Self-pay | Admitting: Neurology

## 2014-06-25 NOTE — Telephone Encounter (Signed)
Patient was contacted and provided the results of her split night sleep study.  Patient was informed that due to the positive indicator for sleep apnea CPAP therapy had been advised for home use.  Patient was understanding and in agreement.  Patient was referred to [facility] for CPAP set up.  Dr. Einar Gip was faxed a copy of the sleep test and the patient was mailed a copy of the sleep study results.   Patient instructed to contact our office 6-8 weeks post set up to schedule a follow up appointment.

## 2014-08-13 ENCOUNTER — Other Ambulatory Visit: Payer: Self-pay | Admitting: Physician Assistant

## 2014-08-13 ENCOUNTER — Encounter: Payer: Self-pay | Admitting: Physician Assistant

## 2014-08-13 ENCOUNTER — Observation Stay (HOSPITAL_COMMUNITY)
Admission: AD | Admit: 2014-08-13 | Discharge: 2014-08-14 | Disposition: A | Discharge status: Home / Self Care | Payer: BLUE CROSS/BLUE SHIELD | Source: Ambulatory Visit | Attending: Orthopedic Surgery | Admitting: Orthopedic Surgery

## 2014-08-13 DIAGNOSIS — I10 Essential (primary) hypertension: Secondary | ICD-10-CM | POA: Diagnosis not present

## 2014-08-13 DIAGNOSIS — T79A29A Traumatic compartment syndrome of unspecified lower extremity, initial encounter: Secondary | ICD-10-CM | POA: Diagnosis present

## 2014-08-13 DIAGNOSIS — Z86718 Personal history of other venous thrombosis and embolism: Secondary | ICD-10-CM

## 2014-08-13 DIAGNOSIS — Z9884 Bariatric surgery status: Secondary | ICD-10-CM

## 2014-08-13 DIAGNOSIS — E669 Obesity, unspecified: Secondary | ICD-10-CM | POA: Diagnosis not present

## 2014-08-13 DIAGNOSIS — D649 Anemia, unspecified: Secondary | ICD-10-CM | POA: Insufficient documentation

## 2014-08-13 DIAGNOSIS — I4891 Unspecified atrial fibrillation: Secondary | ICD-10-CM | POA: Insufficient documentation

## 2014-08-13 DIAGNOSIS — Z888 Allergy status to other drugs, medicaments and biological substances status: Secondary | ICD-10-CM | POA: Insufficient documentation

## 2014-08-13 DIAGNOSIS — T79A0XA Compartment syndrome, unspecified, initial encounter: Secondary | ICD-10-CM | POA: Insufficient documentation

## 2014-08-13 DIAGNOSIS — E559 Vitamin D deficiency, unspecified: Secondary | ICD-10-CM | POA: Insufficient documentation

## 2014-08-13 DIAGNOSIS — D509 Iron deficiency anemia, unspecified: Secondary | ICD-10-CM

## 2014-08-13 DIAGNOSIS — I480 Paroxysmal atrial fibrillation: Secondary | ICD-10-CM | POA: Diagnosis present

## 2014-08-13 DIAGNOSIS — N809 Endometriosis, unspecified: Secondary | ICD-10-CM

## 2014-08-13 DIAGNOSIS — N8003 Adenomyosis of the uterus: Secondary | ICD-10-CM

## 2014-08-13 DIAGNOSIS — X58XXXA Exposure to other specified factors, initial encounter: Secondary | ICD-10-CM | POA: Diagnosis not present

## 2014-08-13 DIAGNOSIS — T79A21A Traumatic compartment syndrome of right lower extremity, initial encounter: Secondary | ICD-10-CM | POA: Diagnosis present

## 2014-08-13 HISTORY — DX: Traumatic compartment syndrome of unspecified lower extremity, initial encounter: T79.A29A

## 2014-08-13 MED ORDER — SODIUM CHLORIDE 0.9 % IV SOLN: 250.0000 mL | INTRAVENOUS | Status: DC | PRN

## 2014-08-13 MED ORDER — SODIUM CHLORIDE 0.9 % IJ SOLN: 3.0000 mL | Freq: Two times a day (BID) | INTRAMUSCULAR | Status: DC

## 2014-08-13 MED ORDER — HYDROMORPHONE HCL 2 MG/ML IJ SOLN: 0.5000 mg | INTRAMUSCULAR | Status: DC | PRN

## 2014-08-13 MED ORDER — OXYCODONE-ACETAMINOPHEN 5-325 MG PO TABS
1.0000 | ORAL_TABLET | ORAL | Status: DC | PRN
  Administered 2014-08-14 (×2): 2 via ORAL
  Filled 2014-08-13 (×2): qty 2

## 2014-08-13 MED ORDER — DEXAMETHASONE SODIUM PHOSPHATE 10 MG/ML IJ SOLN
10.0000 mg | Freq: Three times a day (TID) | INTRAMUSCULAR | Status: DC
  Administered 2014-08-14 (×2): 10 mg via INTRAVENOUS
  Filled 2014-08-13 (×5): qty 1

## 2014-08-13 MED ORDER — SODIUM CHLORIDE 0.9 % IV SOLN
INTRAVENOUS | Status: DC
  Administered 2014-08-14: 01:00:00 via INTRAVENOUS

## 2014-08-13 MED ORDER — POTASSIUM CHLORIDE IN NACL 20-0.9 MEQ/L-% IV SOLN: INTRAVENOUS | Status: DC

## 2014-08-13 MED ORDER — SODIUM CHLORIDE 0.9 % IJ SOLN: 3.0000 mL | INTRAMUSCULAR | Status: DC | PRN

## 2014-08-13 NOTE — H&P (Signed)
[name] is an 49 y.o. female.   Chief Complaint: Right leg pain HPI: Patient went for a walk today about five minutes into the walk she felt a pop and her right leg began to swell.  Xrays at urgent care were negative.  Anterior lateral compartment is firm.  Right leg is  1.5 cm larger than the left.  Patient is on a Blood Thinner [name]  Past Medical History  Diagnosis Date  . Obesity   . Hypertension   . Uterine fibroid   . Anemia   . Bleeding from the nose   . Sinus congestion     saw ENT, had nasal surgery to clean out nasal area for bacteria, mold  . Vitamin D deficiency   . Adenomyosis 7/07  . H/O blood clots     superficial blood clots with OCPs  . Atrial fibrillation     Past Surgical History  Procedure Laterality Date  . Nasal sinus surgery  4/13  . Laparoscopic gastric banding    . Abdominal hysterectomy    . Mouth surgery    . Cesarean section    . Breast biopsy      x2  . Laparoscopy      x2    Family History  Problem Relation Age of Onset  . Cancer Father     prostate and lung  . Diabetes Father   . Hypertension Father   . Diabetes Mother   . Hypertension Mother   . Ovarian cancer Paternal Grandmother   . Hypertension Brother     x2  . Hypothyroidism Brother   . Hypothyroidism Maternal Grandmother    Social History:  reports that she has never smoked. She has never used smokeless tobacco. She reports that she drinks about 0.5 - 1.0 oz of alcohol per week. She reports that she does not use illicit drugs.  Allergies:  Allergies  Allergen Reactions  . Flagyl [Metronidazole] Anaphylaxis   Current Outpatient Prescriptions on File Prior to Visit  Medication Sig Dispense Refill  . ALPRAZolam (XANAX) 0.5 MG tablet Take 0.5 mg by mouth 3 (three) times daily as needed. For anxiety    . CARTIA XT 180 MG 24 hr capsule Take 180 mg by mouth daily.    Marland Kitchen DEXILANT 60 MG capsule     . escitalopram (LEXAPRO) 20 MG tablet Take 20 mg by mouth daily.    .  fluticasone (FLONASE) 50 MCG/ACT nasal spray     . olmesartan-hydrochlorothiazide (BENICAR HCT) 40-25 MG per tablet Take 1 tablet by mouth daily.    Marland Kitchen PROAIR HFA 108 (90 BASE) MCG/ACT inhaler     . SAVAYSA 60 MG TABS tablet Take 60 mg by mouth daily.     No current facility-administered medications on file prior to visit.   No results found for this or any previous visit (from the past 48 hour(s)). No results found.  Review of Systems  Constitutional: Negative.   HENT: Negative.   Eyes: Negative.   Respiratory: Negative.   Cardiovascular: Negative.   Gastrointestinal: Negative.   Genitourinary: Negative.   Musculoskeletal:       Right leg pain  Skin: Negative.   Neurological: Negative.   Endo/Heme/Allergies: Negative.   Psychiatric/Behavioral: Negative.     Last menstrual period 08/10/2005. Physical Exam  Vitals reviewed. Constitutional: She is oriented to person, place, and time. She appears well-developed and well-nourished.  HENT:  Head: Normocephalic and atraumatic.  Cardiovascular: Normal rate and regular rhythm.   Respiratory:  Effort normal and breath sounds normal.  GI: Soft.  Genitourinary:  Not pertinent to current symptomatology therefore not examined.  Musculoskeletal:  Right anterior lateral pain in right lower leg 2+ DP pulse. Firm compartment.  painful  Neurological: She is alert and oriented to person, place, and time.  Skin: Skin is warm and dry.  Psychiatric: She has a normal mood and affect. Her behavior is normal.     Assessment/ Patient Active Problem List   Diagnosis Date Noted  . Traumatic compartment syndrome 08/13/2014  . Compartment syndrome of lower extremity 08/13/2014  . Atrial fibrillation   . H/O blood clots   . Adenomyosis   . Anemia   . Hypertension   . Obesity   . Lap band APS, 02/18/2009 11/13/2011     Plan Admit for observation.  D/C blood thinners.  If symptoms worsen then to OR.  [name] J 08/13/2014, 9:34  PM

## 2014-08-14 ENCOUNTER — Other Ambulatory Visit: Payer: Self-pay | Admitting: Physician Assistant

## 2014-08-14 ENCOUNTER — Encounter (HOSPITAL_COMMUNITY): Payer: Self-pay | Admitting: *Deleted

## 2014-08-14 DIAGNOSIS — T79A21A Traumatic compartment syndrome of right lower extremity, initial encounter: Secondary | ICD-10-CM | POA: Diagnosis not present

## 2014-08-14 MED ORDER — METOPROLOL SUCCINATE ER 50 MG PO TB24
50.0000 mg | ORAL_TABLET | Freq: Every day | ORAL | Status: DC
  Administered 2014-08-14: 50 mg via ORAL
  Filled 2014-08-14: qty 1

## 2014-08-14 MED ORDER — DILTIAZEM HCL ER COATED BEADS 180 MG PO CP24
180.0000 mg | ORAL_CAPSULE | Freq: Every day | ORAL | Status: DC
  Administered 2014-08-14: 180 mg via ORAL
  Filled 2014-08-14: qty 1

## 2014-08-14 MED ORDER — ESCITALOPRAM OXALATE 20 MG PO TABS
20.0000 mg | ORAL_TABLET | Freq: Every day | ORAL | Status: DC
  Administered 2014-08-14: 20 mg via ORAL
  Filled 2014-08-14: qty 1

## 2014-08-14 MED ORDER — ALBUTEROL SULFATE HFA 108 (90 BASE) MCG/ACT IN AERS: 1.0000 | INHALATION_SPRAY | RESPIRATORY_TRACT | Status: DC | PRN

## 2014-08-14 MED ORDER — IRBESARTAN 300 MG PO TABS
300.0000 mg | ORAL_TABLET | Freq: Every day | ORAL | Status: DC
  Filled 2014-08-14: qty 1

## 2014-08-14 MED ORDER — ALBUTEROL SULFATE (2.5 MG/3ML) 0.083% IN NEBU: 2.5000 mg | INHALATION_SOLUTION | RESPIRATORY_TRACT | Status: DC | PRN

## 2014-08-14 MED ORDER — FLUTICASONE PROPIONATE 50 MCG/ACT NA SUSP
2.0000 | Freq: Every day | NASAL | Status: DC
  Filled 2014-08-14: qty 16

## 2014-08-14 MED ORDER — PANTOPRAZOLE SODIUM 40 MG PO TBEC: 40.0000 mg | DELAYED_RELEASE_TABLET | Freq: Every day | ORAL | Status: DC

## 2014-08-14 MED ORDER — OLMESARTAN MEDOXOMIL-HCTZ 40-25 MG PO TABS: 1.0000 | ORAL_TABLET | Freq: Every day | ORAL | Status: DC

## 2014-08-14 MED ORDER — HYDROMORPHONE HCL 1 MG/ML IJ SOLN: 1.0000 mg | INTRAMUSCULAR | Status: DC | PRN

## 2014-08-14 MED ORDER — ALPRAZOLAM 0.5 MG PO TABS: 0.5000 mg | ORAL_TABLET | Freq: Three times a day (TID) | ORAL | Status: DC | PRN

## 2014-08-14 MED ORDER — HYDROCHLOROTHIAZIDE 25 MG PO TABS
25.0000 mg | ORAL_TABLET | Freq: Every day | ORAL | Status: DC
  Filled 2014-08-14: qty 1

## 2014-08-14 MED ORDER — FLECAINIDE ACETATE 50 MG PO TABS
150.0000 mg | ORAL_TABLET | Freq: Two times a day (BID) | ORAL | Status: DC
  Administered 2014-08-14: 150 mg via ORAL
  Filled 2014-08-14 (×2): qty 1

## 2014-08-14 NOTE — H&P (Signed)
Pamela Hedges, PA-C Physician Assistant Signed  H&P 08/13/2014 9:22 PM  Related encounter: Orders Only from 08/13/2014 in [place] is an 49 y.o. female.  Chief Complaint: Right leg pain HPI: Patient went for a walk today about five minutes into the walk she felt a pop and her right leg began to swell. Xrays at urgent care were negative. Anterior lateral compartment is firm. Right leg is 1.5 cm larger than the left. Patient is on a Blood Thinner [name]  Past Medical History  Diagnosis Date  . Obesity   . Hypertension   . Uterine fibroid   . Anemia   . Bleeding from the nose   . Sinus congestion     saw ENT, had nasal surgery to clean out nasal area for bacteria, mold  . Vitamin D deficiency   . Adenomyosis 7/07  . H/O blood clots     superficial blood clots with OCPs  . Atrial fibrillation     Past Surgical History  Procedure Laterality Date  . Nasal sinus surgery  4/13  . Laparoscopic gastric banding    . Abdominal hysterectomy    . Mouth surgery    . Cesarean section    . Breast biopsy      x2  . Laparoscopy      x2    Family History  Problem Relation Age of Onset  . Cancer Father     prostate and lung  . Diabetes Father   . Hypertension Father   . Diabetes Mother   . Hypertension Mother   . Ovarian cancer Paternal Grandmother   . Hypertension Brother     x2  . Hypothyroidism Brother   . Hypothyroidism Maternal Grandmother    Social History:  reports that she has never smoked. She has never used smokeless tobacco. She reports that she drinks about 0.5 - 1.0 oz of alcohol per week. She reports that she does not use illicit drugs.  Allergies:  Allergies  Allergen Reactions  . Flagyl [Metronidazole] Anaphylaxis   Current Outpatient Prescriptions on File Prior to  Visit  Medication Sig Dispense Refill  . ALPRAZolam (XANAX) 0.5 MG tablet Take 0.5 mg by mouth 3 (three) times daily as needed. For anxiety    . CARTIA XT 180 MG 24 hr capsule Take 180 mg by mouth daily.    Marland Kitchen DEXILANT 60 MG capsule     . escitalopram (LEXAPRO) 20 MG tablet Take 20 mg by mouth daily.    . fluticasone (FLONASE) 50 MCG/ACT nasal spray     . olmesartan-hydrochlorothiazide (BENICAR HCT) 40-25 MG per tablet Take 1 tablet by mouth daily.    Marland Kitchen PROAIR HFA 108 (90 BASE) MCG/ACT inhaler     . SAVAYSA 60 MG TABS tablet Take 60 mg by mouth daily.     No current facility-administered medications on file prior to visit.    Lab Results Last 48 Hours    No results found for this or any previous visit (from the past 48 hour(s)).    Imaging Results (Last 48 hours)    No results found.    Review of Systems  Constitutional: Negative.  HENT: Negative.  Eyes: Negative.  Respiratory: Negative.  Cardiovascular: Negative.  Gastrointestinal: Negative.  Genitourinary: Negative.  Musculoskeletal:   Right leg pain  Skin: Negative.  Neurological: Negative.  Endo/Heme/Allergies: Negative.  Psychiatric/Behavioral: Negative.    Last menstrual period 08/10/2005. Physical Exam  Vitals reviewed.  Constitutional: She is oriented to person, place, and time. She appears well-developed and well-nourished.  HENT:  Head: Normocephalic and atraumatic.  Cardiovascular: Normal rate and regular rhythm.  Respiratory: Effort normal and breath sounds normal.  GI: Soft.  Genitourinary:  Not pertinent to current symptomatology therefore not examined.  Musculoskeletal:  Right anterior lateral pain in right lower leg 2+ DP pulse. Firm compartment. painful  Neurological: She is alert and oriented to person, place, and time.  Skin: Skin is warm and dry.  Psychiatric: She has a normal mood and affect. Her behavior is normal.      Assessment/ Patient Active Problem List   Diagnosis Date Noted  . Traumatic compartment syndrome 08/13/2014  . Compartment syndrome of lower extremity 08/13/2014  . Atrial fibrillation   . H/O blood clots   . Adenomyosis   . Anemia   . Hypertension   . Obesity   . Lap band APS, 02/18/2009 11/13/2011     Plan Admit for observation. D/C blood thinners. If symptoms worsen then to OR.  Joushua Dugar J 08/13/2014, 9:34 PM

## 2014-08-14 NOTE — Discharge Instructions (Signed)
Resume your anticoagulation medicine on friday  Continue to elevate, ice and ace-wrap your leg  Bear weight as tolerated

## 2014-08-14 NOTE — Discharge Summary (Signed)
Physician Discharge Summary  [name] MRN: [id number] DOB/AGE: [birth year] 49 y.o.  Admit date: 08/13/2014 Discharge date: 08/14/2014  Admission Diagnoses:  Compartment syndrome of lower extremity  Discharge Diagnoses:  Principal Problem:   Compartment syndrome of lower extremity Active Problems:   Lap band APS, 02/18/2009   Atrial fibrillation   H/O blood clots   Hypertension   Obesity   Past Medical History  Diagnosis Date  . Obesity   . Hypertension   . Uterine fibroid   . Anemia   . Bleeding from the nose   . Sinus congestion     saw ENT, had nasal surgery to clean out nasal area for bacteria, mold  . Vitamin D deficiency   . Adenomyosis 7/07  . H/O blood clots     superficial blood clots with OCPs  . Atrial fibrillation     Surgeries:  on * No surgery found *   Consultants (if any):    Discharged Condition: Improved  Hospital Course: [name] is an 49 y.o. female who was admitted 08/13/2014 with a diagnosis of Compartment syndrome of lower extremity and went to the operating room on * No surgery found * and underwent the above named procedures.    She was given perioperative antibiotics:  Anti-infectives    None    .  She was given sequential compression devices, early ambulation, and resume her normal  DVT prophylaxis.  She benefited maximally from the hospital stay and there were no complications.    Recent vital signs:  Filed Vitals:   08/14/14 0500  BP: 128/80  Pulse: 48  Temp: 97.9 F (36.6 C)  Resp: 16    Recent laboratory studies:  Lab Results  Component Value Date   HGB 11.9* 04/24/2012   HGB 13.6 11/14/2011   HGB 12.3 09/14/2011   Lab Results  Component Value Date   WBC 6.9 04/24/2012   PLT 318 04/24/2012   No results found for: INR Lab Results  Component Value Date   NA 133* 04/24/2012   K 3.4* 04/24/2012   CL 99 04/24/2012   CO2 27 04/24/2012   BUN 12 04/24/2012   CREATININE 0.83 04/24/2012   GLUCOSE 108* 04/24/2012    Discharge Medications:     Medication List    TAKE these medications        ALPRAZolam 0.5 MG tablet  Commonly known as:  XANAX  Take 0.5 mg by mouth 3 (three) times daily as needed. For anxiety     CARTIA XT 180 MG 24 hr capsule  Generic drug:  diltiazem  Take 180 mg by mouth daily.     DEXILANT 60 MG capsule  Generic drug:  dexlansoprazole     escitalopram 20 MG tablet  Commonly known as:  LEXAPRO  Take 20 mg by mouth daily.     flecainide 150 MG tablet  Commonly known as:  TAMBOCOR  Take 150 mg by mouth 2 (two) times daily.     fluticasone 50 MCG/ACT nasal spray  Commonly known as:  FLONASE     metoprolol succinate 50 MG 24 hr tablet  Commonly known as:  TOPROL-XL  Take 50 mg by mouth daily. Take with or immediately following a meal.     olmesartan-hydrochlorothiazide 40-25 MG per tablet  Commonly known as:  BENICAR HCT  Take 1 tablet by mouth daily.     PROAIR HFA 108 (90 BASE) MCG/ACT inhaler  Generic drug:  albuterol  SAVAYSA 60 MG Tabs tablet  Generic drug:  edoxaban  Take 60 mg by mouth daily.        Diagnostic Studies: No results found.  Disposition: 01-Home or Self Care        Follow-up Information    Follow up with Jonell Krontz, D, MD In 1 week.   Specialty:  Orthopedic Surgery   Contact information:   [address] [place] [address] [phone]        Signed: Renette Butters 08/14/2014, 10:13 AM

## 2014-08-17 ENCOUNTER — Other Ambulatory Visit: Payer: Self-pay | Admitting: Physician Assistant

## 2014-08-20 ENCOUNTER — Other Ambulatory Visit: Payer: Self-pay | Admitting: Physician Assistant

## 2014-10-05 ENCOUNTER — Ambulatory Visit (HOSPITAL_COMMUNITY)
Admission: RE | Admit: 2014-10-05 | Discharge: 2014-10-05 | Disposition: A | Discharge status: Home / Self Care | Payer: BLUE CROSS/BLUE SHIELD | Source: Ambulatory Visit | Attending: Cardiology | Admitting: Cardiology

## 2014-10-05 ENCOUNTER — Other Ambulatory Visit (HOSPITAL_COMMUNITY): Payer: Self-pay | Admitting: Orthopedic Surgery

## 2014-10-05 DIAGNOSIS — M7989 Other specified soft tissue disorders: Secondary | ICD-10-CM | POA: Diagnosis not present

## 2014-10-05 DIAGNOSIS — R2241 Localized swelling, mass and lump, right lower limb: Secondary | ICD-10-CM | POA: Diagnosis not present

## 2014-10-05 DIAGNOSIS — M79604 Pain in right leg: Secondary | ICD-10-CM

## 2014-10-05 DIAGNOSIS — M79661 Pain in right lower leg: Secondary | ICD-10-CM

## 2014-10-05 NOTE — Progress Notes (Signed)
Right Lower Ext. Venous Duplex Completed. Negative for DVT or SVT. There is a complex/cystic structure in the proximal, medial calf measuring approx 4.2 x 3.7 x 1.2 cm which may indicate a rupture Baker's cyst vs. Hematoma. Oda Cogan, BS, RDMS, RVT

## 2014-10-20 ENCOUNTER — Emergency Department (HOSPITAL_COMMUNITY): Payer: BLUE CROSS/BLUE SHIELD

## 2014-10-20 ENCOUNTER — Inpatient Hospital Stay (HOSPITAL_COMMUNITY)
Admission: EM | Admit: 2014-10-20 | Discharge: 2014-10-22 | DRG: 179 | Disposition: A | Discharge status: Home / Self Care | Payer: BLUE CROSS/BLUE SHIELD | Attending: Internal Medicine | Admitting: Internal Medicine

## 2014-10-20 ENCOUNTER — Encounter (HOSPITAL_COMMUNITY): Payer: Self-pay | Admitting: Emergency Medicine

## 2014-10-20 DIAGNOSIS — I4891 Unspecified atrial fibrillation: Secondary | ICD-10-CM | POA: Diagnosis present

## 2014-10-20 DIAGNOSIS — T79A29A Traumatic compartment syndrome of unspecified lower extremity, initial encounter: Secondary | ICD-10-CM | POA: Diagnosis present

## 2014-10-20 DIAGNOSIS — R509 Fever, unspecified: Secondary | ICD-10-CM | POA: Diagnosis not present

## 2014-10-20 DIAGNOSIS — J690 Pneumonitis due to inhalation of food and vomit: Secondary | ICD-10-CM | POA: Diagnosis present

## 2014-10-20 DIAGNOSIS — E876 Hypokalemia: Secondary | ICD-10-CM | POA: Diagnosis present

## 2014-10-20 DIAGNOSIS — I10 Essential (primary) hypertension: Secondary | ICD-10-CM | POA: Diagnosis present

## 2014-10-20 DIAGNOSIS — Z7901 Long term (current) use of anticoagulants: Secondary | ICD-10-CM | POA: Diagnosis not present

## 2014-10-20 DIAGNOSIS — Z8041 Family history of malignant neoplasm of ovary: Secondary | ICD-10-CM | POA: Diagnosis not present

## 2014-10-20 DIAGNOSIS — Z8249 Family history of ischemic heart disease and other diseases of the circulatory system: Secondary | ICD-10-CM | POA: Diagnosis not present

## 2014-10-20 DIAGNOSIS — K219 Gastro-esophageal reflux disease without esophagitis: Secondary | ICD-10-CM | POA: Diagnosis present

## 2014-10-20 DIAGNOSIS — I480 Paroxysmal atrial fibrillation: Secondary | ICD-10-CM | POA: Diagnosis present

## 2014-10-20 DIAGNOSIS — Z9884 Bariatric surgery status: Secondary | ICD-10-CM | POA: Diagnosis not present

## 2014-10-20 DIAGNOSIS — Z833 Family history of diabetes mellitus: Secondary | ICD-10-CM

## 2014-10-20 DIAGNOSIS — Z86718 Personal history of other venous thrombosis and embolism: Secondary | ICD-10-CM

## 2014-10-20 DIAGNOSIS — Z6838 Body mass index (BMI) 38.0-38.9, adult: Secondary | ICD-10-CM | POA: Diagnosis not present

## 2014-10-20 DIAGNOSIS — T79A21S Traumatic compartment syndrome of right lower extremity, sequela: Secondary | ICD-10-CM

## 2014-10-20 HISTORY — DX: Bronchitis, not specified as acute or chronic: J40

## 2014-10-20 LAB — CBC WITH DIFFERENTIAL/PLATELET
BASOS ABS: 0 10*3/uL (ref 0.0–0.1)
BASOS PCT: 0 % (ref 0–1)
Eosinophils Absolute: 0.1 10*3/uL (ref 0.0–0.7)
Eosinophils Relative: 1 % (ref 0–5)
HCT: 35.9 % — ABNORMAL LOW (ref 36.0–46.0)
Hemoglobin: 11.7 g/dL — ABNORMAL LOW (ref 12.0–15.0)
Lymphocytes Relative: 7 % — ABNORMAL LOW (ref 12–46)
Lymphs Abs: 0.6 10*3/uL — ABNORMAL LOW (ref 0.7–4.0)
MCH: 29 pg (ref 26.0–34.0)
MCHC: 32.6 g/dL (ref 30.0–36.0)
MCV: 89.1 fL (ref 78.0–100.0)
Monocytes Absolute: 0.5 10*3/uL (ref 0.1–1.0)
Monocytes Relative: 5 % (ref 3–12)
NEUTROS ABS: 7.7 10*3/uL (ref 1.7–7.7)
NEUTROS PCT: 87 % — AB (ref 43–77)
PLATELETS: 315 10*3/uL (ref 150–400)
RBC: 4.03 MIL/uL (ref 3.87–5.11)
RDW: 14.1 % (ref 11.5–15.5)
WBC: 8.8 10*3/uL (ref 4.0–10.5)

## 2014-10-20 LAB — I-STAT CHEM 8, ED
BUN: 7 mg/dL (ref 6–23)
Calcium, Ion: 1.13 mmol/L (ref 1.12–1.23)
Chloride: 97 mmol/L (ref 96–112)
Creatinine, Ser: 0.8 mg/dL (ref 0.50–1.10)
GLUCOSE: 109 mg/dL — AB (ref 70–99)
HEMATOCRIT: 40 % (ref 36.0–46.0)
HEMOGLOBIN: 13.6 g/dL (ref 12.0–15.0)
POTASSIUM: 3.2 mmol/L — AB (ref 3.5–5.1)
Sodium: 139 mmol/L (ref 135–145)
TCO2: 26 mmol/L (ref 0–100)

## 2014-10-20 LAB — I-STAT CG4 LACTIC ACID, ED: LACTIC ACID, VENOUS: 1.92 mmol/L (ref 0.5–2.0)

## 2014-10-20 MED ORDER — ACETAMINOPHEN 325 MG PO TABS
650.0000 mg | ORAL_TABLET | Freq: Four times a day (QID) | ORAL | Status: DC | PRN
  Administered 2014-10-20: 650 mg via ORAL
  Filled 2014-10-20: qty 2

## 2014-10-20 MED ORDER — ONDANSETRON HCL 4 MG/2ML IJ SOLN: 4.0000 mg | Freq: Four times a day (QID) | INTRAMUSCULAR | Status: DC | PRN

## 2014-10-20 MED ORDER — GUAIFENESIN-DM 100-10 MG/5ML PO SYRP
5.0000 mL | ORAL_SOLUTION | ORAL | Status: DC | PRN
  Filled 2014-10-20: qty 5

## 2014-10-20 MED ORDER — ALPRAZOLAM 0.5 MG PO TABS
0.5000 mg | ORAL_TABLET | Freq: Three times a day (TID) | ORAL | Status: DC | PRN
  Administered 2014-10-20 – 2014-10-21 (×2): 0.5 mg via ORAL
  Filled 2014-10-20 (×2): qty 1

## 2014-10-20 MED ORDER — OSELTAMIVIR PHOSPHATE 75 MG PO CAPS
75.0000 mg | ORAL_CAPSULE | Freq: Two times a day (BID) | ORAL | Status: DC
  Administered 2014-10-20 – 2014-10-21 (×2): 75 mg via ORAL
  Filled 2014-10-20 (×3): qty 1

## 2014-10-20 MED ORDER — CLINDAMYCIN PHOSPHATE 600 MG/50ML IV SOLN
600.0000 mg | Freq: Three times a day (TID) | INTRAVENOUS | Status: DC
  Administered 2014-10-20 – 2014-10-22 (×5): 600 mg via INTRAVENOUS
  Filled 2014-10-20 (×8): qty 50

## 2014-10-20 MED ORDER — BUDESONIDE-FORMOTEROL FUMARATE 160-4.5 MCG/ACT IN AERO
2.0000 | INHALATION_SPRAY | Freq: Two times a day (BID) | RESPIRATORY_TRACT | Status: DC
  Administered 2014-10-20 – 2014-10-22 (×4): 2 via RESPIRATORY_TRACT
  Filled 2014-10-20: qty 6

## 2014-10-20 MED ORDER — ACETAMINOPHEN 650 MG RE SUPP: 650.0000 mg | Freq: Four times a day (QID) | RECTAL | Status: DC | PRN

## 2014-10-20 MED ORDER — SODIUM CHLORIDE 0.9 % IV BOLUS (SEPSIS)
500.0000 mL | Freq: Once | INTRAVENOUS | Status: AC
  Administered 2014-10-20: 500 mL via INTRAVENOUS

## 2014-10-20 MED ORDER — POTASSIUM CHLORIDE CRYS ER 20 MEQ PO TBCR
40.0000 meq | EXTENDED_RELEASE_TABLET | Freq: Once | ORAL | Status: AC
  Administered 2014-10-20: 40 meq via ORAL
  Filled 2014-10-20: qty 2

## 2014-10-20 MED ORDER — FLECAINIDE ACETATE 50 MG PO TABS
150.0000 mg | ORAL_TABLET | Freq: Two times a day (BID) | ORAL | Status: DC
  Administered 2014-10-20 – 2014-10-22 (×4): 150 mg via ORAL
  Filled 2014-10-20 (×5): qty 1

## 2014-10-20 MED ORDER — ONDANSETRON HCL 4 MG PO TABS: 4.0000 mg | ORAL_TABLET | Freq: Four times a day (QID) | ORAL | Status: DC | PRN

## 2014-10-20 MED ORDER — ALBUTEROL SULFATE (2.5 MG/3ML) 0.083% IN NEBU
2.5000 mg | INHALATION_SOLUTION | RESPIRATORY_TRACT | Status: DC | PRN
  Administered 2014-10-21 (×2): 2.5 mg via RESPIRATORY_TRACT
  Filled 2014-10-20 (×2): qty 3

## 2014-10-20 MED ORDER — HYDROCHLOROTHIAZIDE 25 MG PO TABS
25.0000 mg | ORAL_TABLET | Freq: Every day | ORAL | Status: DC
  Administered 2014-10-20 – 2014-10-22 (×3): 25 mg via ORAL
  Filled 2014-10-20 (×3): qty 1

## 2014-10-20 MED ORDER — GUAIFENESIN ER 600 MG PO TB12
600.0000 mg | ORAL_TABLET | Freq: Two times a day (BID) | ORAL | Status: DC
  Administered 2014-10-20 – 2014-10-22 (×4): 600 mg via ORAL
  Filled 2014-10-20 (×5): qty 1

## 2014-10-20 MED ORDER — ACETAMINOPHEN 325 MG PO TABS
650.0000 mg | ORAL_TABLET | Freq: Four times a day (QID) | ORAL | Status: DC | PRN
  Administered 2014-10-21: 650 mg via ORAL
  Filled 2014-10-20: qty 2

## 2014-10-20 MED ORDER — IOHEXOL 350 MG/ML SOLN
100.0000 mL | Freq: Once | INTRAVENOUS | Status: AC | PRN
  Administered 2014-10-20: 100 mL via INTRAVENOUS

## 2014-10-20 MED ORDER — VANCOMYCIN HCL IN DEXTROSE 1-5 GM/200ML-% IV SOLN
1000.0000 mg | Freq: Three times a day (TID) | INTRAVENOUS | Status: DC
  Administered 2014-10-20 – 2014-10-22 (×5): 1000 mg via INTRAVENOUS
  Filled 2014-10-20 (×7): qty 200

## 2014-10-20 MED ORDER — METOPROLOL SUCCINATE ER 50 MG PO TB24
50.0000 mg | ORAL_TABLET | Freq: Every day | ORAL | Status: DC
  Administered 2014-10-20 – 2014-10-22 (×3): 50 mg via ORAL
  Filled 2014-10-20 (×3): qty 1

## 2014-10-20 MED ORDER — ENOXAPARIN SODIUM 40 MG/0.4ML ~~LOC~~ SOLN: 40.0000 mg | SUBCUTANEOUS | Status: DC

## 2014-10-20 MED ORDER — CLINDAMYCIN PHOSPHATE 600 MG/50ML IV SOLN
600.0000 mg | Freq: Once | INTRAVENOUS | Status: AC
  Administered 2014-10-20: 600 mg via INTRAVENOUS
  Filled 2014-10-20: qty 50

## 2014-10-20 MED ORDER — LORATADINE 10 MG PO TABS
10.0000 mg | ORAL_TABLET | Freq: Every day | ORAL | Status: DC
  Administered 2014-10-20 – 2014-10-22 (×3): 10 mg via ORAL
  Filled 2014-10-20 (×3): qty 1

## 2014-10-20 MED ORDER — OLMESARTAN MEDOXOMIL-HCTZ 40-25 MG PO TABS: 1.0000 | ORAL_TABLET | Freq: Every day | ORAL | Status: DC

## 2014-10-20 MED ORDER — PANTOPRAZOLE SODIUM 40 MG PO TBEC
40.0000 mg | DELAYED_RELEASE_TABLET | Freq: Every day | ORAL | Status: DC
  Administered 2014-10-20 – 2014-10-22 (×3): 40 mg via ORAL
  Filled 2014-10-20 (×3): qty 1

## 2014-10-20 MED ORDER — IRBESARTAN 300 MG PO TABS
300.0000 mg | ORAL_TABLET | Freq: Every day | ORAL | Status: DC
  Administered 2014-10-20 – 2014-10-22 (×3): 300 mg via ORAL
  Filled 2014-10-20 (×3): qty 1

## 2014-10-20 MED ORDER — FENTANYL CITRATE 0.05 MG/ML IJ SOLN
50.0000 ug | Freq: Once | INTRAMUSCULAR | Status: AC
  Administered 2014-10-20: 50 ug via INTRAVENOUS
  Filled 2014-10-20: qty 2

## 2014-10-20 MED ORDER — ESCITALOPRAM OXALATE 20 MG PO TABS
20.0000 mg | ORAL_TABLET | Freq: Every day | ORAL | Status: DC
  Administered 2014-10-20 – 2014-10-22 (×3): 20 mg via ORAL
  Filled 2014-10-20 (×3): qty 1

## 2014-10-20 MED ORDER — DILTIAZEM HCL ER COATED BEADS 180 MG PO CP24
180.0000 mg | ORAL_CAPSULE | Freq: Every day | ORAL | Status: DC
  Administered 2014-10-20 – 2014-10-22 (×3): 180 mg via ORAL
  Filled 2014-10-20 (×3): qty 1

## 2014-10-20 MED ORDER — EDOXABAN TOSYLATE 60 MG PO TABS
60.0000 mg | ORAL_TABLET | Freq: Every day | ORAL | Status: DC
  Administered 2014-10-20 – 2014-10-22 (×3): 60 mg via ORAL
  Filled 2014-10-20 (×3): qty 60

## 2014-10-20 NOTE — ED Notes (Signed)
Pt from home with c/o worsening bronchitis with original dx date of 09/18/14.  EMS reports pt had severe episode of coughing with tightness in her throat leading to emesis x 2.  Lung sounds clear in all fields.  Pt reports fever 102, decreased PO intake, denies telling PCP yesterday at office of fever.  Currently on Symbicort, stopped taking fast acting inhaler per PCP.  Pt has had at least x 2 ABX for bronchitis and wound to right leg which happen 09/13/14.  Given 4 mg IM zofran and 5 mg albuterol which decreased coughing and tightness.  Pt in NAD, A&O.

## 2014-10-20 NOTE — ED Notes (Signed)
Per Dr Alvino Chapel; No blood cultures prior to ABX.

## 2014-10-20 NOTE — ED Notes (Signed)
Patient transported to CT 

## 2014-10-20 NOTE — Consult Note (Signed)
Reason for Consult:dilated esophagus, ?aspiration pneumonia Referring Physician: Dr Dallie Dad is an 49 y.o. female.  HPI: 89 year old morbidly obese African-American female with a history of laparoscopic adjustable gastric band placement (AP standard) July 2010 by Dr. Alphonsa Overall, hypertension, paroxysmal A. fib, history of compartment syndrome admitted earlier today for pneumonia. She apparently had been having cough, shortness of breath, subjective fevers for while. She came to the emergency room and a CT chest was performed which demonstrated no evidence of pulmonary emboli but did demonstrate airspace disease in the right upper and lower lobes along with a dilated fluid-filled esophagus. I was asked to comment on whether or not her lap band could've caused her to have aspiration pneumonia. She denies any abdominal pain. She states that she will regurgitate if she eats too rapidly. She tends to eat small meals during the daytime and then eats at night when she gets home from work. She tends to eat dinner around 8 PM and then subsequently go to bed a short time thereafter. She will have some nighttime cough. She denies reflux. She does endorse some hiccuping.  In 2014 she had an ongoing persistent cough. She ended up having an upper GI which demonstrated delayed passage of barium consistent with a tight band. She had 2 mL of fluid removed in November 2014. She slowly had her band refilled. We last saw her in the office on 03/01/2014. She had a lap band adjustment at that point. Total expected band volume 4.25 mL. Her weight at that time was 281 pounds.  Past Medical History  Diagnosis Date  . Obesity   . Hypertension   . Uterine fibroid   . Anemia   . Bleeding from the nose   . Sinus congestion     saw ENT, had nasal surgery to clean out nasal area for bacteria, mold  . Vitamin D deficiency   . Adenomyosis 7/07  . H/O blood clots     superficial blood clots with OCPs  . Atrial  fibrillation   . Bronchitis     Past Surgical History  Procedure Laterality Date  . Nasal sinus surgery  4/13  . Laparoscopic gastric banding    . Abdominal hysterectomy    . Mouth surgery    . Cesarean section    . Breast biopsy      x2  . Laparoscopy      x2    Family History  Problem Relation Age of Onset  . Cancer Father     prostate and lung  . Diabetes Father   . Hypertension Father   . Diabetes Mother   . Hypertension Mother   . Ovarian cancer Paternal Grandmother   . Hypertension Brother     x2  . Hypothyroidism Brother   . Hypothyroidism Maternal Grandmother     Social History:  reports that she has never smoked. She has never used smokeless tobacco. She reports that she drinks about 0.5 - 1.0 oz of alcohol per week. She reports that she does not use illicit drugs.  Allergies:  Allergies  Allergen Reactions  . Flagyl [Metronidazole] Anaphylaxis  . Levaquin [Levofloxacin] Other (See Comments)    Pains and aches throughout body, pt states cant move well    Medications: I have reviewed the patient's current medications.  Results for orders placed or performed during the hospital encounter of 10/20/14 (from the past 48 hour(s))  CBC with Differential     Status: Abnormal   Collection Time:  10/20/14 12:22 PM  Result Value Ref Range   WBC 8.8 4.0 - 10.5 K/uL   RBC 4.03 3.87 - 5.11 MIL/uL   Hemoglobin 11.7 (L) 12.0 - 15.0 g/dL   HCT 35.9 (L) 36.0 - 46.0 %   MCV 89.1 78.0 - 100.0 fL   MCH 29.0 26.0 - 34.0 pg   MCHC 32.6 30.0 - 36.0 g/dL   RDW 14.1 11.5 - 15.5 %   Platelets 315 150 - 400 K/uL   Neutrophils Relative % 87 (H) 43 - 77 %   Neutro Abs 7.7 1.7 - 7.7 K/uL   Lymphocytes Relative 7 (L) 12 - 46 %   Lymphs Abs 0.6 (L) 0.7 - 4.0 K/uL   Monocytes Relative 5 3 - 12 %   Monocytes Absolute 0.5 0.1 - 1.0 K/uL   Eosinophils Relative 1 0 - 5 %   Eosinophils Absolute 0.1 0.0 - 0.7 K/uL   Basophils Relative 0 0 - 1 %   Basophils Absolute 0.0 0.0 - 0.1  K/uL  I-stat Chem 8, ED     Status: Abnormal   Collection Time: 10/20/14  1:08 PM  Result Value Ref Range   Sodium 139 135 - 145 mmol/L   Potassium 3.2 (L) 3.5 - 5.1 mmol/L   Chloride 97 96 - 112 mmol/L   BUN 7 6 - 23 mg/dL   Creatinine, Ser 0.80 0.50 - 1.10 mg/dL   Glucose, Bld 109 (H) 70 - 99 mg/dL   Calcium, Ion 1.13 1.12 - 1.23 mmol/L   TCO2 26 0 - 100 mmol/L   Hemoglobin 13.6 12.0 - 15.0 g/dL   HCT 40.0 36.0 - 46.0 %  I-Stat CG4 Lactic Acid, ED     Status: None   Collection Time: 10/20/14  1:09 PM  Result Value Ref Range   Lactic Acid, Venous 1.92 0.5 - 2.0 mmol/L    Dg Chest 2 View  10/20/2014   CLINICAL DATA:  Respiratory distress and fever.  EXAM: CHEST  2 VIEW  COMPARISON:  04/13/2013  FINDINGS: Heart size appears normal. No pleural effusion identified. Right lung base opacities are identified compatible with pneumonia. Left lung is clear.  IMPRESSION: 1. Right lower lobe pneumonia.   Electronically Signed   By: Kerby Moors M.D.   On: 10/20/2014 13:37   Ct Head Wo Contrast  10/20/2014   CLINICAL DATA:  Headache, fever and cough  EXAM: CT HEAD WITHOUT CONTRAST  TECHNIQUE: Contiguous axial images were obtained from the base of the skull through the vertex without intravenous contrast.  COMPARISON:  02/21/2012  FINDINGS: No acute cortical infarct, hemorrhage, or mass lesion is present. Ventricles are of normal size. No significant extra-axial fluid collection is present. Chronic left maxillary sinus mucoperiosteal thickening is identified status post median antrectomy. There is also partial opacification of the sphenoid sinus. The mastoid air cells are clear. The calvarium appears intact. The osseous skull is intact.  IMPRESSION: 1. No acute intracranial abnormalities. 2. Chronic left maxillary sinus mucoperiosteal thickening status post median antrectomy.   Electronically Signed   By: Kerby Moors M.D.   On: 10/20/2014 13:36   Ct Angio Chest Pe W/cm &/or Wo Cm  10/20/2014    CLINICAL DATA:  Respiratory distress. Fever, cough and vomiting. Evaluate for pulmonary embolism.  EXAM: CT ANGIOGRAPHY CHEST WITH CONTRAST  TECHNIQUE: Multidetector CT imaging of the chest was performed using the standard protocol during bolus administration of intravenous contrast. Multiplanar CT image reconstructions and MIPs were obtained to evaluate  the vascular anatomy.  CONTRAST:  100 cc Omnipaque 300  COMPARISON:  Chest radiograph - earlier same day; 04/13/2013  FINDINGS: Vascular Findings:  There is suboptimal opacification of the pulmonary arterial system with the main pulmonary artery measuring only 180 Hounsfield units). There are no discrete filling defects within the central pulmonary arterial tree. Evaluation of the bilateral segmental and subsegmental pulmonary arteries is degraded secondary to a combination of suboptimal vessel opacification, patient respiratory artifact as well as quantum mottle artifact due to patient body habitus. Normal caliber of the main pulmonary artery.  Cardiomegaly.  No pericardial effusion.  Normal caliber of the thoracic aorta given significant pulsation artifact. No definite thoracic aortic dissection or periaortic stranding on this nongated examination. Bovine configuration of the aortic arch is incidentally noted. Branch vessels of the aortic arch appear widely patent throughout their imaged course. Incidental note is made of an azygos nipple.  Review of the MIP images confirms the above findings.   ----------------------------------------------------------------------------------  Nonvascular Findings:  Evaluation of the pulmonary parenchyma is degraded secondary to patient respiratory artifact.  Post gastric band placement with distention of the upstream esophagus and moderate amount of layering debris seen within its mid aspect (image 72, series 4). This finding is associated with ill-defined heterogeneous slightly nodular airspace opacities within the anterior  segment of the right upper lobe (representative image 48, series 406 as well as the lateral basilar segment of the right lower lobe (image 68 and posterior basilar segment of the right lower lobe (image 84). The central pulmonary airways appear patent given obliquity. No discrete air bronchograms. No pleural effusion or pneumothorax.  Evaluation for pulmonary nodules is markedly degraded due to patient respiratory artifact. Shotty bilateral hilar lymph nodes with index right suprahilar nodal conglomeration measuring approximately 1.1 cm in greatest short axis diameter (image 51, series 41) and index right infrahilar nodal conglomeration measuring 0.8 cm (image 55) are presumably reactive etiology. No mediastinal, hilar or axillary lymphadenopathy.  Limited evaluation of the upper abdomen demonstrates a partially exophytic approximately 4 cm hypo attenuating (3 Hounsfield unit) presumed cyst arising from the superior pole of the left kidney, incompletely imaged. Several splenules are also suspected within the right upper abdominal quadrant.  No acute or aggressive osseous abnormalities.  Regional soft tissues appear normal. Normal noncontrast appearance of thyroid gland.  IMPRESSION: 1. No evidence of central pulmonary embolism. Evaluation of the bilateral segmental and subsegmental pulmonary arteries is degraded secondary to suboptimal vessel opacification as well as quantum mottle and patient motion artifact. If clinical concern persists for pulmonary embolism, further evaluation could be performed with bilateral lower extremity venous Doppler ultrasound. (Note, a V/Q scanning is not advised given the presence of heterogeneous airspace opacities seen on preceding chest radiograph therefore increasing the probability of an indeterminate V/Q scan). 2. Post gastric banding with distension of the upstream esophagus which is noted to contain a moderate amount of debris - this finding is associated with ill-defined  heterogeneous airspace opacities within the right upper and lower lobes which are favored to be the sequela of either aspiration and/or infection (including atypical etiologies). 3. Cardiomegaly.   Electronically Signed   By: Sandi Mariscal M.D.   On: 10/20/2014 15:03    Review of Systems  Constitutional: Positive for fever. Negative for chills and weight loss.  HENT: Negative for nosebleeds.   Eyes: Negative for blurred vision.  Respiratory: Positive for cough, sputum production and wheezing. Negative for shortness of breath.   Cardiovascular: Negative for chest pain,  palpitations, orthopnea and PND.       Denies DOE  Gastrointestinal: Positive for nausea. Negative for abdominal pain, constipation, blood in stool and melena.  Genitourinary: Negative for dysuria and hematuria.  Musculoskeletal: Negative.   Skin: Negative for itching and rash.  Neurological: Positive for weakness. Negative for dizziness, focal weakness, seizures, loss of consciousness and headaches.       Denies TIAs, amaurosis fugax  Endo/Heme/Allergies: Does not bruise/bleed easily.  Psychiatric/Behavioral: The patient is not nervous/anxious.    Blood pressure 134/61, pulse 76, temperature 99.1 F (37.3 C), temperature source Oral, resp. rate 18, height 5\' 9"  (1.753 m), last menstrual period 08/10/2005, SpO2 95 %. Physical Exam  Vitals reviewed. Constitutional: She is oriented to person, place, and time. She appears well-developed and well-nourished. No distress.  Morbidly obese, nontoxic  HENT:  Head: Normocephalic and atraumatic.  Right Ear: External ear normal.  Left Ear: External ear normal.  Eyes: Conjunctivae are normal. No scleral icterus.  Neck: Normal range of motion. Neck supple. No tracheal deviation present. No thyromegaly present.  Cardiovascular: Normal rate and normal heart sounds.   Respiratory: Effort normal. No stridor. No respiratory distress. She has wheezes (mild wheeze).  GI: Soft. She exhibits no  distension. There is no tenderness. There is no rebound and no guarding.  Well healed trocar site; lapband port in right mid abd -slight rotation to port  Musculoskeletal: She exhibits no edema or tenderness.  Lymphadenopathy:    She has no cervical adenopathy.  Neurological: She is alert and oriented to person, place, and time. She exhibits normal muscle tone.  Skin: Skin is warm and dry. No rash noted. She is not diaphoretic. No erythema. No pallor.  Psychiatric: She has a normal mood and affect. Her behavior is normal. Judgment and thought content normal.    Assessment/Plan: Aspiration/healthcare acquired pneumonia Morbid obesity Hypertension Paroxysmal A. fib History of lower extremity compartment syndrome Status post laparoscopic adjustable gastric band placement 2010 (initial visit weight 312lbs)  She tends to downplay her answers to my questions regarding her lap band. She says that she'll eat crackers and cheese during the daytime. It seems like she eats rapidly. She doesn't eat dinner until 8 PM when she gets home and then goes to bed a short time later. I do believe her band is probably contributing to reflux and causing some aspiration. I think this is what was going on in 2014 as well. I'm not sure she truly understands proper eating techniques with the LapBand. Her esophagus is dilated on her CT scan with some debris in her esophagus. It does not appear that she has had a lap band slip. I do think we need to drain her band.  After obtaining verbal consent, the abdominal wall was prepped with Chloraprep. The port was accessed with a Huber needle and 4.2 cc of saline was removed to give the patient an expected fill volume of 0 cc.  The patient was able to tolerate sips of water.  She will need outpatient follow-up with Dr. Alphonsa Overall. We will have her watch EMMI videos on LapBand as an outpt. I'll defer to Dr. Lucia Gaskins as to whether or not he wants to get an outpatient upper  GI  Eating techniques 20-20-20 (30-30-30) 20 chews, 20 seconds between bites of food, 20 minutes to eat; sometimes you may need 30 chews, 30 seconds etc Use your nondominant hand to eat with Put fork down between bites of food Use a timer after swallowing to  reinforce waiting 20-30 sec between bites of food Use a child/infant size utensil Try not to eat while watching TV  Please call with questions Signing off  Leighton Ruff. Redmond Pulling, MD, FACS General, Bariatric, & Minimally Invasive Surgery [facility], [place]   [facility] M 10/20/2014, 9:16 PM

## 2014-10-20 NOTE — ED Provider Notes (Signed)
CSN: [id number]     Arrival date & time 10/20/14  1155 History   First MD Initiated Contact with Patient 10/20/14 1208     Chief Complaint  Patient presents with  . Respiratory Distress  . Fever     (Consider location/radiation/quality/duration/timing/severity/associated sxs/prior Treatment) Patient is a 49 y.o. female presenting with fever.  Fever Associated symptoms: chest pain and cough   Associated symptoms: no diarrhea, no headaches, no nausea, no rash and no vomiting    patient has shortness of breath and cough with fever. Has been on antibiotics twice by her PCP. One was Augmentin and she does not know what the other one was. States she has not gotten any better. Also has had some swelling in her right leg. She's had a negative Doppler and states she was admitted hospital to rule out compartment syndrome after being on an anticoagulant for her atrial fibrillation. She does not smoke. She's had some sputum production.  Past Medical History  Diagnosis Date  . Obesity   . Hypertension   . Uterine fibroid   . Anemia   . Bleeding from the nose   . Sinus congestion     saw ENT, had nasal surgery to clean out nasal area for bacteria, mold  . Vitamin D deficiency   . Adenomyosis 7/07  . H/O blood clots     superficial blood clots with OCPs  . Atrial fibrillation   . Bronchitis    Past Surgical History  Procedure Laterality Date  . Nasal sinus surgery  4/13  . Laparoscopic gastric banding    . Abdominal hysterectomy    . Mouth surgery    . Cesarean section    . Breast biopsy      x2  . Laparoscopy      x2   Family History  Problem Relation Age of Onset  . Cancer Father     prostate and lung  . Diabetes Father   . Hypertension Father   . Diabetes Mother   . Hypertension Mother   . Ovarian cancer Paternal Grandmother   . Hypertension Brother     x2  . Hypothyroidism Brother   . Hypothyroidism Maternal Grandmother    History  Substance Use Topics  . Smoking  status: Never Smoker   . Smokeless tobacco: Never Used  . Alcohol Use: 0.5 - 1.0 oz/week    1-2 Standard drinks or equivalent per week     Comment: "periodically"   OB History    Gravida Para Term Preterm AB TAB SAB Ectopic Multiple Living   2 1        1       Obstetric Comments   One adopted child     Review of Systems  Constitutional: Positive for fever and appetite change. Negative for activity change.  Eyes: Negative for pain.  Respiratory: Positive for cough and shortness of breath. Negative for chest tightness.   Cardiovascular: Positive for chest pain and leg swelling.  Gastrointestinal: Negative for nausea, vomiting, abdominal pain and diarrhea.  Genitourinary: Negative for flank pain.  Musculoskeletal: Negative for back pain and neck stiffness.  Skin: Negative for rash and wound.  Neurological: Negative for weakness, numbness and headaches.  Psychiatric/Behavioral: Negative for behavioral problems.      Allergies  Flagyl and Levaquin  Home Medications   Prior to Admission medications   Medication Sig Start Date End Date Taking? Authorizing Provider  acetaminophen (TYLENOL) 650 MG CR tablet Take 650 mg by mouth every 8 (  eight) hours as needed for pain.   Yes Historical Provider, MD  ALPRAZolam Duanne Moron) 0.5 MG tablet Take 0.5 mg by mouth 3 (three) times daily as needed. For anxiety   Yes Historical Provider, MD  benzonatate (TESSALON) 200 MG capsule Take 200 mg by mouth 3 (three) times daily as needed for cough.   Yes Historical Provider, MD  budesonide-formoterol (SYMBICORT) 160-4.5 MCG/ACT inhaler Inhale 2 puffs into the lungs 2 (two) times daily.   Yes Historical Provider, MD  CARTIA XT 180 MG 24 hr capsule Take 180 mg by mouth daily. 05/03/14  Yes Historical Provider, MD  DEXILANT 60 MG capsule Take 60 mg by mouth daily.  07/31/13  Yes Historical Provider, MD  escitalopram (LEXAPRO) 20 MG tablet Take 20 mg by mouth daily.   Yes Historical Provider, MD  flecainide  (TAMBOCOR) 150 MG tablet Take 150 mg by mouth 2 (two) times daily.   Yes Historical Provider, MD  loratadine (CLARITIN) 10 MG tablet Take 10 mg by mouth daily.   Yes Historical Provider, MD  metoprolol succinate (TOPROL-XL) 50 MG 24 hr tablet Take 50 mg by mouth daily. Take with or immediately following a meal.   Yes Historical Provider, MD  olmesartan-hydrochlorothiazide (BENICAR HCT) 40-25 MG per tablet Take 1 tablet by mouth daily.   Yes Historical Provider, MD  SAVAYSA 60 MG TABS tablet Take 60 mg by mouth daily. 05/03/14  Yes Historical Provider, MD   BP 140/66 mmHg  Pulse 95  Temp(Src) 100.8 F (38.2 C) (Oral)  Resp 16  Ht 5\' 9"  (1.753 m)  SpO2 96%  LMP 08/10/2005 Physical Exam  Constitutional: She is oriented to person, place, and time. She appears well-developed and well-nourished.  HENT:  Head: Normocephalic and atraumatic.  Eyes: EOM are normal. Pupils are equal, round, and reactive to light.  Neck: Normal range of motion.  Cardiovascular: Normal rate and normal heart sounds.   No murmur heard. Pulmonary/Chest: No respiratory distress. She has no wheezes. She has no rales.  Slightly harsh breath sounds throughout. Tachypnea. No focal rales.  Abdominal: Soft. Bowel sounds are normal. She exhibits no distension. There is no tenderness.  Musculoskeletal: She exhibits edema.  Swelling of right lower extremity. Strong pulse and foot. No erythema.  Neurological: She is alert and oriented to person, place, and time. No cranial nerve deficit.  Skin: Skin is warm.  Psychiatric: Her speech is normal.  Nursing note and vitals reviewed.   ED Course  Procedures (including critical care time) Labs Review Labs Reviewed  CBC WITH DIFFERENTIAL/PLATELET - Abnormal; Notable for the following:    Hemoglobin 11.7 (*)    HCT 35.9 (*)    Neutrophils Relative % 87 (*)    Lymphocytes Relative 7 (*)    Lymphs Abs 0.6 (*)    All other components within normal limits  I-STAT CHEM 8, ED -  Abnormal; Notable for the following:    Potassium 3.2 (*)    Glucose, Bld 109 (*)    All other components within normal limits  I-STAT CG4 LACTIC ACID, ED    Imaging Review Dg Chest 2 View  10/20/2014   CLINICAL DATA:  Respiratory distress and fever.  EXAM: CHEST  2 VIEW  COMPARISON:  04/13/2013  FINDINGS: Heart size appears normal. No pleural effusion identified. Right lung base opacities are identified compatible with pneumonia. Left lung is clear.  IMPRESSION: 1. Right lower lobe pneumonia.   Electronically Signed   By: Kerby Moors M.D.   On:  10/20/2014 13:37   Ct Head Wo Contrast  10/20/2014   CLINICAL DATA:  Headache, fever and cough  EXAM: CT HEAD WITHOUT CONTRAST  TECHNIQUE: Contiguous axial images were obtained from the base of the skull through the vertex without intravenous contrast.  COMPARISON:  02/21/2012  FINDINGS: No acute cortical infarct, hemorrhage, or mass lesion is present. Ventricles are of normal size. No significant extra-axial fluid collection is present. Chronic left maxillary sinus mucoperiosteal thickening is identified status post median antrectomy. There is also partial opacification of the sphenoid sinus. The mastoid air cells are clear. The calvarium appears intact. The osseous skull is intact.  IMPRESSION: 1. No acute intracranial abnormalities. 2. Chronic left maxillary sinus mucoperiosteal thickening status post median antrectomy.   Electronically Signed   By: Kerby Moors M.D.   On: 10/20/2014 13:36   Ct Angio Chest Pe W/cm &/or Wo Cm  10/20/2014   CLINICAL DATA:  Respiratory distress. Fever, cough and vomiting. Evaluate for pulmonary embolism.  EXAM: CT ANGIOGRAPHY CHEST WITH CONTRAST  TECHNIQUE: Multidetector CT imaging of the chest was performed using the standard protocol during bolus administration of intravenous contrast. Multiplanar CT image reconstructions and MIPs were obtained to evaluate the vascular anatomy.  CONTRAST:  100 cc Omnipaque 300   COMPARISON:  Chest radiograph - earlier same day; 04/13/2013  FINDINGS: Vascular Findings:  There is suboptimal opacification of the pulmonary arterial system with the main pulmonary artery measuring only 180 Hounsfield units). There are no discrete filling defects within the central pulmonary arterial tree. Evaluation of the bilateral segmental and subsegmental pulmonary arteries is degraded secondary to a combination of suboptimal vessel opacification, patient respiratory artifact as well as quantum mottle artifact due to patient body habitus. Normal caliber of the main pulmonary artery.  Cardiomegaly.  No pericardial effusion.  Normal caliber of the thoracic aorta given significant pulsation artifact. No definite thoracic aortic dissection or periaortic stranding on this nongated examination. Bovine configuration of the aortic arch is incidentally noted. Branch vessels of the aortic arch appear widely patent throughout their imaged course. Incidental note is made of an azygos nipple.  Review of the MIP images confirms the above findings.   ----------------------------------------------------------------------------------  Nonvascular Findings:  Evaluation of the pulmonary parenchyma is degraded secondary to patient respiratory artifact.  Post gastric band placement with distention of the upstream esophagus and moderate amount of layering debris seen within its mid aspect (image 72, series 4). This finding is associated with ill-defined heterogeneous slightly nodular airspace opacities within the anterior segment of the right upper lobe (representative image 48, series 406 as well as the lateral basilar segment of the right lower lobe (image 68 and posterior basilar segment of the right lower lobe (image 84). The central pulmonary airways appear patent given obliquity. No discrete air bronchograms. No pleural effusion or pneumothorax.  Evaluation for pulmonary nodules is markedly degraded due to patient  respiratory artifact. Shotty bilateral hilar lymph nodes with index right suprahilar nodal conglomeration measuring approximately 1.1 cm in greatest short axis diameter (image 51, series 41) and index right infrahilar nodal conglomeration measuring 0.8 cm (image 55) are presumably reactive etiology. No mediastinal, hilar or axillary lymphadenopathy.  Limited evaluation of the upper abdomen demonstrates a partially exophytic approximately 4 cm hypo attenuating (3 Hounsfield unit) presumed cyst arising from the superior pole of the left kidney, incompletely imaged. Several splenules are also suspected within the right upper abdominal quadrant.  No acute or aggressive osseous abnormalities.  Regional soft tissues appear normal. Normal  noncontrast appearance of thyroid gland.  IMPRESSION: 1. No evidence of central pulmonary embolism. Evaluation of the bilateral segmental and subsegmental pulmonary arteries is degraded secondary to suboptimal vessel opacification as well as quantum mottle and patient motion artifact. If clinical concern persists for pulmonary embolism, further evaluation could be performed with bilateral lower extremity venous Doppler ultrasound. (Note, a V/Q scanning is not advised given the presence of heterogeneous airspace opacities seen on preceding chest radiograph therefore increasing the probability of an indeterminate V/Q scan). 2. Post gastric banding with distension of the upstream esophagus which is noted to contain a moderate amount of debris - this finding is associated with ill-defined heterogeneous airspace opacities within the right upper and lower lobes which are favored to be the sequela of either aspiration and/or infection (including atypical etiologies). 3. Cardiomegaly.   Electronically Signed   By: Sandi Mariscal M.D.   On: 10/20/2014 15:03     EKG Interpretation   Date/Time:  Saturday October 20 2014 11:55:41 EST Ventricular Rate:  87 PR Interval:  174 QRS Duration: 107 QT  Interval:  376 QTC Calculation: 452 R Axis:   31 Text Interpretation:  Sinus rhythm Consider right atrial enlargement  Baseline wander in lead(s) V1 V2 Confirmed by Alvino Chapel  MD, Ovid Curd  [phone]) on 10/20/2014 1:23:11 PM      MDM   Final diagnoses:  Aspiration pneumonia, unspecified aspiration pneumonia type    Patient with shortness of breath and cough. X-ray showed possible pneumonia but with unilateral leg swelling CT angiography was done. It showed likely aspiration pneumonia. She does have a dilated esophagus up from her gastric banding. Has been on antibiotics. Will admit to internal medicine.    Davonna Belling, MD 10/20/14 [phone]

## 2014-10-20 NOTE — H&P (Addendum)
[facility] History and Physical  DIAMONE WHISTLER OEV:[id number] DOB: [birth year] DOA: 10/20/2014  Referring physician: Dr. Alvino Chapel PCP: Rachell Cipro, MD   Chief Complaint:  Fever with productive cough and shortness of breath  HPI:  49 year old female with morbid obesity status post lap banding in 2010, history of DVT and paroxysmal A. fib on chronic anticoagulation, hypertension, chronic sinusitis, recent hospitalization 2 months back for compartment syndrome of right lower extremity is managed conservatively ( seen by Dr. Fredonia Highland) presented to the ED with cough with yellowish-brown productive phlegm and worsening shortness of breath with fever since this morning. Also reports frontal headaches. Patient reports that for the past 4 weeks she has been having cough with productive phlegm, wheezy and has been on 2 different antibiotics prescribed by her PCP along with a course of prednisone. She reports that her symptoms have not improved and she has been off work for  the past 2 weeks. She reports recent travel 4 weeks back but denies any sick contacts. Patient denies dizziness, nausea , vomiting, chest pain, palpitations,  abdominal pain, bowel or urinary symptoms. Denies change in weight or appetite.  Course in the ED  patient was febrile to 10 93F, remaining vitals were stable. Blood will done showed normal WBC, hb of 13.6, normal platelets. Chemistry showed sodium 139, K of 3.2, normal renal function.  Chest x-ray showed right lower lobe pneumonia. Head CT done given headache with fever was unremarkable except for chronic left maxillary sinus mucoperiosteal thickening. CT angiogram of the chest was done was negative for central PE but bilateral segmental evidence of pulmonary arteries were not clearly visualized. Also showed post gastric banding with distention of the upstream esophagus with moderate debris. Patient started on empiric clindamycin and hospitalists consulted for  admission.  Review of Systems:  Constitutional:  fever, chills, appetite change and fatigue.  HEENT: Headache, nasal congestion, cough with productive phlegm, denies mouth sores, difficulty swallowing, neck pain or stiffness Respiratory: SOB, DOE, cough, wheezing, denies chest tightness,     Cardiovascular: Denies chest pain, palpitations and leg swelling.  Gastrointestinal: Denies nausea, vomiting, abdominal pain, diarrhea, constipation, blood in stool and abdominal distention.  Genitourinary: Denies dysuria, hematuria, flank pain and difficulty urinating.  Endocrine: Denies: hot or cold intolerance, polyuria, polydipsia. Musculoskeletal: Denies myalgias, back pain, joint pain o. Rt leg swelling Skin: Denies pallor, rash and wound.  Neurological: Headaches+,  Denies dizziness, seizures, syncope, weakness, light-headedness, numbness  Hematological: Denies adenopathy.  Psychiatric/Behavioral: Denies confusion  Past Medical History  Diagnosis Date  . Obesity   . Hypertension   . Uterine fibroid   . Anemia   . Bleeding from the nose   . Sinus congestion     saw ENT, had nasal surgery to clean out nasal area for bacteria, mold  . Vitamin D deficiency   . Adenomyosis 7/07  . H/O blood clots     superficial blood clots with OCPs  . Atrial fibrillation   . Bronchitis    Past Surgical History  Procedure Laterality Date  . Nasal sinus surgery  4/13  . Laparoscopic gastric banding    . Abdominal hysterectomy    . Mouth surgery    . Cesarean section    . Breast biopsy      x2  . Laparoscopy      x2   Social History:  reports that she has never smoked. She has never used smokeless tobacco. She reports that she drinks about 0.5 - 1.0 oz of alcohol  per week. She reports that she does not use illicit drugs.  Allergies  Allergen Reactions  . Flagyl [Metronidazole] Anaphylaxis  . Levaquin [Levofloxacin] Other (See Comments)    Pains and aches throughout body, pt states cant move well     Family History  Problem Relation Age of Onset  . Cancer Father     prostate and lung  . Diabetes Father   . Hypertension Father   . Diabetes Mother   . Hypertension Mother   . Ovarian cancer Paternal Grandmother   . Hypertension Brother     x2  . Hypothyroidism Brother   . Hypothyroidism Maternal Grandmother     Prior to Admission medications   Medication Sig Start Date End Date Taking? Authorizing Provider  acetaminophen (TYLENOL) 650 MG CR tablet Take 650 mg by mouth every 8 (eight) hours as needed for pain.   Yes Historical Provider, MD  ALPRAZolam Duanne Moron) 0.5 MG tablet Take 0.5 mg by mouth 3 (three) times daily as needed. For anxiety   Yes Historical Provider, MD  benzonatate (TESSALON) 200 MG capsule Take 200 mg by mouth 3 (three) times daily as needed for cough.   Yes Historical Provider, MD  budesonide-formoterol (SYMBICORT) 160-4.5 MCG/ACT inhaler Inhale 2 puffs into the lungs 2 (two) times daily.   Yes Historical Provider, MD  CARTIA XT 180 MG 24 hr capsule Take 180 mg by mouth daily. 05/03/14  Yes Historical Provider, MD  DEXILANT 60 MG capsule Take 60 mg by mouth daily.  07/31/13  Yes Historical Provider, MD  escitalopram (LEXAPRO) 20 MG tablet Take 20 mg by mouth daily.   Yes Historical Provider, MD  flecainide (TAMBOCOR) 150 MG tablet Take 150 mg by mouth 2 (two) times daily.   Yes Historical Provider, MD  loratadine (CLARITIN) 10 MG tablet Take 10 mg by mouth daily.   Yes Historical Provider, MD  metoprolol succinate (TOPROL-XL) 50 MG 24 hr tablet Take 50 mg by mouth daily. Take with or immediately following a meal.   Yes Historical Provider, MD  olmesartan-hydrochlorothiazide (BENICAR HCT) 40-25 MG per tablet Take 1 tablet by mouth daily.   Yes Historical Provider, MD  SAVAYSA 60 MG TABS tablet Take 60 mg by mouth daily. 05/03/14  Yes Historical Provider, MD     Physical Exam:  Filed Vitals:   10/20/14 1501 10/20/14 1545 10/20/14 1600 10/20/14 1643  BP:  140/80  140/66 140/68  Pulse:  92 95 89  Temp: 103 F (39.4 C)  100.8 F (38.2 C) 101.9 F (38.8 C)  TempSrc: Oral  Oral Oral  Resp:  16  18  Height:      SpO2:  94% 96% 94%    Constitutional: Vital signs reviewed.  Middle aged obese female in no acute distress HEENT: no pallor, no icterus, moist oral mucosa, no cervical lymphadenopathy Cardiovascular: RRR, S1 normal, S2 normal, no MRG Chest: Coarse crackles over left lung base with rhonchi, diminished breath sounds over right lung base Abdominal: Soft. Non-tender, non-distended, bowel sounds are normal,  Ext: warm, mild swelling of the right calf, nontender  Neurological: Alert and oriented  Labs on Admission:  Basic Metabolic Panel:  Recent Labs Lab 10/20/14 1308  NA 139  K 3.2*  CL 97  GLUCOSE 109*  BUN 7  CREATININE 0.80   Liver Function Tests: No results for input(s): AST, ALT, ALKPHOS, BILITOT, PROT, ALBUMIN in the last 168 hours. No results for input(s): LIPASE, AMYLASE in the last 168 hours. No results for input(s):  AMMONIA in the last 168 hours. CBC:  Recent Labs Lab 10/20/14 1222 10/20/14 1308  WBC 8.8  --   NEUTROABS 7.7  --   HGB 11.7* 13.6  HCT 35.9* 40.0  MCV 89.1  --   PLT 315  --    Cardiac Enzymes: No results for input(s): CKTOTAL, CKMB, CKMBINDEX, TROPONINI in the last 168 hours. BNP: Invalid input(s): POCBNP CBG: No results for input(s): GLUCAP in the last 168 hours.  Radiological Exams on Admission: Dg Chest 2 View  10/20/2014   CLINICAL DATA:  Respiratory distress and fever.  EXAM: CHEST  2 VIEW  COMPARISON:  04/13/2013  FINDINGS: Heart size appears normal. No pleural effusion identified. Right lung base opacities are identified compatible with pneumonia. Left lung is clear.  IMPRESSION: 1. Right lower lobe pneumonia.   Electronically Signed   By: Kerby Moors M.D.   On: 10/20/2014 13:37   Ct Head Wo Contrast  10/20/2014   CLINICAL DATA:  Headache, fever and cough  EXAM: CT HEAD WITHOUT  CONTRAST  TECHNIQUE: Contiguous axial images were obtained from the base of the skull through the vertex without intravenous contrast.  COMPARISON:  02/21/2012  FINDINGS: No acute cortical infarct, hemorrhage, or mass lesion is present. Ventricles are of normal size. No significant extra-axial fluid collection is present. Chronic left maxillary sinus mucoperiosteal thickening is identified status post median antrectomy. There is also partial opacification of the sphenoid sinus. The mastoid air cells are clear. The calvarium appears intact. The osseous skull is intact.  IMPRESSION: 1. No acute intracranial abnormalities. 2. Chronic left maxillary sinus mucoperiosteal thickening status post median antrectomy.   Electronically Signed   By: Kerby Moors M.D.   On: 10/20/2014 13:36   Ct Angio Chest Pe W/cm &/or Wo Cm  10/20/2014   CLINICAL DATA:  Respiratory distress. Fever, cough and vomiting. Evaluate for pulmonary embolism.  EXAM: CT ANGIOGRAPHY CHEST WITH CONTRAST  TECHNIQUE: Multidetector CT imaging of the chest was performed using the standard protocol during bolus administration of intravenous contrast. Multiplanar CT image reconstructions and MIPs were obtained to evaluate the vascular anatomy.  CONTRAST:  100 cc Omnipaque 300  COMPARISON:  Chest radiograph - earlier same day; 04/13/2013  FINDINGS: Vascular Findings:  There is suboptimal opacification of the pulmonary arterial system with the main pulmonary artery measuring only 180 Hounsfield units). There are no discrete filling defects within the central pulmonary arterial tree. Evaluation of the bilateral segmental and subsegmental pulmonary arteries is degraded secondary to a combination of suboptimal vessel opacification, patient respiratory artifact as well as quantum mottle artifact due to patient body habitus. Normal caliber of the main pulmonary artery.  Cardiomegaly.  No pericardial effusion.  Normal caliber of the thoracic aorta given significant  pulsation artifact. No definite thoracic aortic dissection or periaortic stranding on this nongated examination. Bovine configuration of the aortic arch is incidentally noted. Branch vessels of the aortic arch appear widely patent throughout their imaged course. Incidental note is made of an azygos nipple.  Review of the MIP images confirms the above findings.   ----------------------------------------------------------------------------------  Nonvascular Findings:  Evaluation of the pulmonary parenchyma is degraded secondary to patient respiratory artifact.  Post gastric band placement with distention of the upstream esophagus and moderate amount of layering debris seen within its mid aspect (image 72, series 4). This finding is associated with ill-defined heterogeneous slightly nodular airspace opacities within the anterior segment of the right upper lobe (representative image 48, series 406 as well as the  lateral basilar segment of the right lower lobe (image 68 and posterior basilar segment of the right lower lobe (image 84). The central pulmonary airways appear patent given obliquity. No discrete air bronchograms. No pleural effusion or pneumothorax.  Evaluation for pulmonary nodules is markedly degraded due to patient respiratory artifact. Shotty bilateral hilar lymph nodes with index right suprahilar nodal conglomeration measuring approximately 1.1 cm in greatest short axis diameter (image 51, series 41) and index right infrahilar nodal conglomeration measuring 0.8 cm (image 55) are presumably reactive etiology. No mediastinal, hilar or axillary lymphadenopathy.  Limited evaluation of the upper abdomen demonstrates a partially exophytic approximately 4 cm hypo attenuating (3 Hounsfield unit) presumed cyst arising from the superior pole of the left kidney, incompletely imaged. Several splenules are also suspected within the right upper abdominal quadrant.  No acute or aggressive osseous abnormalities.   Regional soft tissues appear normal. Normal noncontrast appearance of thyroid gland.  IMPRESSION: 1. No evidence of central pulmonary embolism. Evaluation of the bilateral segmental and subsegmental pulmonary arteries is degraded secondary to suboptimal vessel opacification as well as quantum mottle and patient motion artifact. If clinical concern persists for pulmonary embolism, further evaluation could be performed with bilateral lower extremity venous Doppler ultrasound. (Note, a V/Q scanning is not advised given the presence of heterogeneous airspace opacities seen on preceding chest radiograph therefore increasing the probability of an indeterminate V/Q scan). 2. Post gastric banding with distension of the upstream esophagus which is noted to contain a moderate amount of debris - this finding is associated with ill-defined heterogeneous airspace opacities within the right upper and lower lobes which are favored to be the sequela of either aspiration and/or infection (including atypical etiologies). 3. Cardiomegaly.   Electronically Signed   By: Sandi Mariscal M.D.   On: 10/20/2014 15:03    EKG: Normal sinus rhythm, no ST-T changes  Assessment/Plan Principal Problem:   Aspiration/ health care associated  Pneumonia Admit to MedSurg Will place on empiric IV vancomycin and clindamycin. Follow blood culture, sputum culture, urine for strep antigen and Legionella, HIV antibody, flu PCR. Supportive care with Tylenol, antiemetics, antitussives and IV fluids. Place on empiric Tamiflu. Will place on when necessary albuterol neb and home inhalers. -Swallow evaluation in am given concern for aspiration. - Active Problems:   Lap band APS, 02/18/2009 Distention of upstream esophagus with moderate debris on CT scan. Unclear if  this contributed to pts aspiration. Consulted [facility] ( Dr Redmond Pulling), who will look in to the CT scan.  Paroxysmal A. fib Rate controlled. Continue Cardizem, blocker and edoxaban     Essential hypertension Blood pressure control. Resume home medications    Compartment syndrome of right lower extremity Recently hospitalized and managed nonsurgically. Follows with Dr. Percell Miller as outpatient. Had repeat Doppler ultrasound of the right leg done on 2/26 without evidence for DVT or thrombophlebitis and showed complex cyst possible for ruptured Baker's cyst versus hematoma.   Hypokalemia Replenish with KCl      Diet: Clear liquid  DVT prophylaxis: on edoxaban    Code Status: full code Family Communication: discussed with Patient  Disposition Plan: admit to med surg  Louellen Molder [facility] Pager [phone]  Total time spent on admission :70 minutes  If 7PM-7AM, please contact night-coverage [email or web address] Password [email or web address] 10/20/2014, 4:47 PM

## 2014-10-20 NOTE — Progress Notes (Signed)
ANTIBIOTIC CONSULT NOTE - INITIAL  Pharmacy Consult for vancomycin Indication: pneumonia  Allergies  Allergen Reactions  . Flagyl [Metronidazole] Anaphylaxis  . Levaquin [Levofloxacin] Other (See Comments)    Pains and aches throughout body, pt states cant move well    Patient Measurements: Height: 5\' 9"  (175.3 cm) IBW/kg (Calculated) : 66.2  Vital Signs: Temp: 100.8 F (38.2 C) (03/12 1600) Temp Source: Oral (03/12 1600) BP: 140/66 mmHg (03/12 1600) Pulse Rate: 95 (03/12 1600) Intake/Output from previous day:   Intake/Output from this shift:    Labs:  Recent Labs  10/20/14 1222 10/20/14 1308  WBC 8.8  --   HGB 11.7* 13.6  PLT 315  --   CREATININE  --  0.80   CrCl cannot be calculated (Unknown ideal weight.). No results for input(s): VANCOTROUGH, VANCOPEAK, VANCORANDOM, GENTTROUGH, GENTPEAK, GENTRANDOM, TOBRATROUGH, TOBRAPEAK, TOBRARND, AMIKACINPEAK, AMIKACINTROU, AMIKACIN in the last 72 hours.   Microbiology: No results found for this or any previous visit (from the past 720 hour(s)).  Medical History: Past Medical History  Diagnosis Date  . Obesity   . Hypertension   . Uterine fibroid   . Anemia   . Bleeding from the nose   . Sinus congestion     saw ENT, had nasal surgery to clean out nasal area for bacteria, mold  . Vitamin D deficiency   . Adenomyosis 7/07  . H/O blood clots     superficial blood clots with OCPs  . Atrial fibrillation   . Bronchitis     Medications:  Prescriptions prior to admission  Medication Sig Dispense Refill Last Dose  . acetaminophen (TYLENOL) 650 MG CR tablet Take 650 mg by mouth every 8 (eight) hours as needed for pain.   10/19/2014 at Unknown time  . ALPRAZolam (XANAX) 0.5 MG tablet Take 0.5 mg by mouth 3 (three) times daily as needed. For anxiety   10/19/2014 at Unknown time  . benzonatate (TESSALON) 200 MG capsule Take 200 mg by mouth 3 (three) times daily as needed for cough.   10/19/2014 at Unknown time  .  budesonide-formoterol (SYMBICORT) 160-4.5 MCG/ACT inhaler Inhale 2 puffs into the lungs 2 (two) times daily.   10/20/2014 at Unknown time  . CARTIA XT 180 MG 24 hr capsule Take 180 mg by mouth daily.   10/19/2014 at Unknown time  . DEXILANT 60 MG capsule Take 60 mg by mouth daily.    10/19/2014 at Unknown time  . escitalopram (LEXAPRO) 20 MG tablet Take 20 mg by mouth daily.   10/19/2014 at Unknown time  . flecainide (TAMBOCOR) 150 MG tablet Take 150 mg by mouth 2 (two) times daily.   10/19/2014 at Unknown time  . loratadine (CLARITIN) 10 MG tablet Take 10 mg by mouth daily.   10/19/2014 at Unknown time  . metoprolol succinate (TOPROL-XL) 50 MG 24 hr tablet Take 50 mg by mouth daily. Take with or immediately following a meal.   10/19/2014 at 0900  . olmesartan-hydrochlorothiazide (BENICAR HCT) 40-25 MG per tablet Take 1 tablet by mouth daily.   10/19/2014 at Unknown time  . SAVAYSA 60 MG TABS tablet Take 60 mg by mouth daily.   10/19/2014 at Unknown time   Assessment: 49 y/o female who presents to the ED with worsening bronchitis and fever. CXR with RLL PNA. She received clindamycin at 16:00 and this was continued. Renal function is normal. WBC are normal.  Goal of Therapy:  Vancomycin trough level 15-20 mcg/ml  Plan:  - Vancomycin 1000 mg IV  q8h - Monitor renal function, clinical course, and [employer], Pharm.D., BCPS Clinical Pharmacist Pager: [phone] 10/20/2014 4:41 PM

## 2014-10-21 LAB — BASIC METABOLIC PANEL
ANION GAP: 9 (ref 5–15)
CALCIUM: 8.7 mg/dL (ref 8.4–10.5)
CO2: 29 mmol/L (ref 19–32)
CREATININE: 0.73 mg/dL (ref 0.50–1.10)
Chloride: 99 mmol/L (ref 96–112)
GLUCOSE: 87 mg/dL (ref 70–99)
Potassium: 3 mmol/L — ABNORMAL LOW (ref 3.5–5.1)
Sodium: 137 mmol/L (ref 135–145)

## 2014-10-21 LAB — CBC
HCT: 36.4 % (ref 36.0–46.0)
HEMOGLOBIN: 11.8 g/dL — AB (ref 12.0–15.0)
MCH: 28.1 pg (ref 26.0–34.0)
MCHC: 32.4 g/dL (ref 30.0–36.0)
MCV: 86.7 fL (ref 78.0–100.0)
Platelets: 344 10*3/uL (ref 150–400)
RBC: 4.2 MIL/uL (ref 3.87–5.11)
RDW: 14.1 % (ref 11.5–15.5)
WBC: 8.8 10*3/uL (ref 4.0–10.5)

## 2014-10-21 LAB — INFLUENZA PANEL BY PCR (TYPE A & B)
H1N1 flu by pcr: NOT DETECTED
Influenza A By PCR: NEGATIVE
Influenza B By PCR: NEGATIVE

## 2014-10-21 LAB — HIV ANTIBODY (ROUTINE TESTING W REFLEX): HIV Screen 4th Generation wRfx: NONREACTIVE

## 2014-10-21 LAB — STREP PNEUMONIAE URINARY ANTIGEN: Strep Pneumo Urinary Antigen: NEGATIVE

## 2014-10-21 MED ORDER — POTASSIUM CHLORIDE CRYS ER 20 MEQ PO TBCR
40.0000 meq | EXTENDED_RELEASE_TABLET | ORAL | Status: AC
  Administered 2014-10-21 (×2): 40 meq via ORAL
  Filled 2014-10-21 (×2): qty 2

## 2014-10-21 MED ORDER — ALBUTEROL SULFATE (2.5 MG/3ML) 0.083% IN NEBU
2.5000 mg | INHALATION_SOLUTION | Freq: Three times a day (TID) | RESPIRATORY_TRACT | Status: DC
  Administered 2014-10-21 – 2014-10-22 (×2): 2.5 mg via RESPIRATORY_TRACT
  Filled 2014-10-21 (×2): qty 3

## 2014-10-21 MED ORDER — MAGNESIUM SULFATE IN D5W 10-5 MG/ML-% IV SOLN
1.0000 g | Freq: Once | INTRAVENOUS | Status: AC
  Administered 2014-10-21: 1 g via INTRAVENOUS
  Filled 2014-10-21: qty 100

## 2014-10-21 NOTE — Progress Notes (Signed)
Patient Demographics  [name], is a 49 y.o. female, DOB - [birth year], XMI:[id number]  Admit date - 10/20/2014   Admitting Physician Pamela Molder, MD  Outpatient Primary MD for the patient is [facility], [place]  LOS - 1   Chief Complaint  Patient presents with  . Respiratory Distress  . Fever        Subjective:   [name] today has, No headache, No chest pain, No abdominal pain - No Nausea, No new weakness tingling or numbness, No Cough - Improved SOB.    Assessment & Plan    Aspiration/ health care associated Pneumonia Improved with empiric IV antibiotics, Tamiflu, supportive care with nebulizer treatments and oxygen. Pending influenza panel, HIV, strep pneumonia and legionella antigen. Speech to evaluate rule out ongoing aspiration.     Lap band APS, 02/18/2009 Distention of upstream esophagus with moderate debris on CT scan. General surgery was consulted and gastric band pressure was released by bedside fluid removal. Stable now.     Paroxysmal A. fib Rate controlled. Continue Cardizem, blocker and edoxaban   Essential hypertension Blood pressure control. Resume home medications   Compartment syndrome of right lower extremity Recently hospitalized and managed nonsurgically. Follows with [name] as outpatient. Had repeat Doppler ultrasound of the right leg done on 2/26 without evidence for DVT or thrombophlebitis and showed complex cyst possible for ruptured Baker's cyst versus hematoma. Clinically stable.   Hypokalemia Replenish, recheck in am    Code Status: Full  Family Communication: none  Disposition Plan: Home   Procedures CTA lungs   Consults      Medications  Scheduled Meds: . budesonide-formoterol  2 puff Inhalation BID  . clindamycin  (CLEOCIN) IV  600 mg Intravenous 3 times per day  . diltiazem  180 mg Oral Daily  . edoxaban  60 mg Oral Daily  . escitalopram  20 mg Oral Daily  . flecainide  150 mg Oral BID  . guaiFENesin  600 mg Oral BID  . hydrochlorothiazide  25 mg Oral Daily  . irbesartan  300 mg Oral Daily  . loratadine  10 mg Oral Daily  . magnesium sulfate 1 - 4 g bolus IVPB  1 g Intravenous Once  . metoprolol succinate  50 mg Oral Daily  . oseltamivir  75 mg Oral BID  . pantoprazole  40 mg Oral Daily  . potassium chloride  40 mEq Oral Q4H  . vancomycin  1,000 mg Intravenous Q8H   Continuous Infusions:  PRN Meds:.acetaminophen **OR** acetaminophen, albuterol, ALPRAZolam, guaiFENesin-dextromethorphan, ondansetron **OR** ondansetron (ZOFRAN) IV  DVT Prophylaxis  Edoxaban  Lab Results  Component Value Date   PLT 344 10/21/2014    Antibiotics     Anti-infectives    Start     Dose/Rate Route Frequency Ordered Stop   10/20/14 2200  oseltamivir (TAMIFLU) capsule 75 mg     75 mg Oral 2 times daily 10/20/14 1645 10/25/14 2159   10/20/14 1800  vancomycin (VANCOCIN) IVPB 1000 mg/200 mL premix     1,000 mg 200 mL/hr over 60 Minutes Intravenous Every 8 hours 10/20/14 1649     10/20/14 1645  clindamycin (CLEOCIN) IVPB 600 mg     600 mg 100 mL/hr over 30 Minutes Intravenous 3 times  per day 10/20/14 1639     10/20/14 1545  clindamycin (CLEOCIN) IVPB 600 mg     600 mg 100 mL/hr over 30 Minutes Intravenous  Once 10/20/14 1539 10/20/14 1639          Objective:   Filed Vitals:   10/20/14 1643 10/20/14 2103 10/21/14 0516 10/21/14 0934  BP: 140/68 134/61 141/81   Pulse: 89 76 73   Temp: 101.9 F (38.8 C) 99.1 F (37.3 C) 98.5 F (36.9 C)   TempSrc: Oral Oral Oral   Resp: 18 18 18    Height:      SpO2: 94% 95% 93% 91%    Wt Readings from Last 3 Encounters:  05/09/14 122.018 kg (269 lb)  03/01/14 127.551 kg (281 lb 3.2 oz)  11/30/13 127.098 kg (280 lb 3.2 oz)     Intake/Output Summary (Last 24  hours) at 10/21/14 0954 Last data filed at 10/20/14 2103  Gross per 24 hour  Intake      0 ml  Output    200 ml  Net   -200 ml     Physical Exam  Awake Alert, Oriented X 3, No new F.N deficits, Normal affect [name].AT,PERRAL Supple Neck,No JVD, No cervical lymphadenopathy appriciated.  Symmetrical Chest wall movement, Good air movement bilaterally, CTAB RRR,No Gallops,Rubs or new Murmurs, No Parasternal Heave +ve B.Sounds, Abd Soft, No tenderness, No organomegaly appriciated, No rebound - guarding or rigidity. No Cyanosis, Clubbing or edema, No new Rash or bruise      Data Review   Micro Results Recent Results (from the past 240 hour(s))  Culture, blood (routine x 2) Call MD if unable to obtain prior to antibiotics being given     Status: None (Preliminary result)   Collection Time: 10/20/14  5:28 PM  Result Value Ref Range Status   Specimen Description HAND LEFT  Final   Special Requests BOTTLES DRAWN AEROBIC ONLY 10 CC  Final   Culture   Final           BLOOD CULTURE RECEIVED NO GROWTH TO DATE CULTURE WILL BE HELD FOR 5 DAYS BEFORE ISSUING A FINAL NEGATIVE REPORT Performed at [facility]    Report Status PENDING  Incomplete  Culture, blood (routine x 2) Call MD if unable to obtain prior to antibiotics being given     Status: None (Preliminary result)   Collection Time: 10/20/14  5:33 PM  Result Value Ref Range Status   Specimen Description HAND RIGHT  Final   Special Requests BOTTLES DRAWN AEROBIC ONLY 10 CC  Final   Culture   Final           BLOOD CULTURE RECEIVED NO GROWTH TO DATE CULTURE WILL BE HELD FOR 5 DAYS BEFORE ISSUING A FINAL NEGATIVE REPORT Performed at [facility]    Report Status PENDING  Incomplete    Radiology Reports Dg Chest 2 View  10/20/2014   CLINICAL DATA:  Respiratory distress and fever.  EXAM: CHEST  2 VIEW  COMPARISON:  04/13/2013  FINDINGS: Heart size appears normal. No pleural effusion identified. Right lung base opacities are  identified compatible with pneumonia. Left lung is clear.  IMPRESSION: 1. Right lower lobe pneumonia.   Electronically Signed   By: Kerby Moors M.D.   On: 10/20/2014 13:37   Ct Head Wo Contrast  10/20/2014   CLINICAL DATA:  Headache, fever and cough  EXAM: CT HEAD WITHOUT CONTRAST  TECHNIQUE: Contiguous axial images were obtained from the base of  the skull through the vertex without intravenous contrast.  COMPARISON:  02/21/2012  FINDINGS: No acute cortical infarct, hemorrhage, or mass lesion is present. Ventricles are of normal size. No significant extra-axial fluid collection is present. Chronic left maxillary sinus mucoperiosteal thickening is identified status post median antrectomy. There is also partial opacification of the sphenoid sinus. The mastoid air cells are clear. The calvarium appears intact. The osseous skull is intact.  IMPRESSION: 1. No acute intracranial abnormalities. 2. Chronic left maxillary sinus mucoperiosteal thickening status post median antrectomy.   Electronically Signed   By: Kerby Moors M.D.   On: 10/20/2014 13:36   Ct Angio Chest Pe W/cm &/or Wo Cm  10/20/2014   CLINICAL DATA:  Respiratory distress. Fever, cough and vomiting. Evaluate for pulmonary embolism.  EXAM: CT ANGIOGRAPHY CHEST WITH CONTRAST  TECHNIQUE: Multidetector CT imaging of the chest was performed using the standard protocol during bolus administration of intravenous contrast. Multiplanar CT image reconstructions and MIPs were obtained to evaluate the vascular anatomy.  CONTRAST:  100 cc Omnipaque 300  COMPARISON:  Chest radiograph - earlier same day; 04/13/2013  FINDINGS: Vascular Findings:  There is suboptimal opacification of the pulmonary arterial system with the main pulmonary artery measuring only 180 Hounsfield units). There are no discrete filling defects within the central pulmonary arterial tree. Evaluation of the bilateral segmental and subsegmental pulmonary arteries is degraded secondary to a  combination of suboptimal vessel opacification, patient respiratory artifact as well as quantum mottle artifact due to patient body habitus. Normal caliber of the main pulmonary artery.  Cardiomegaly.  No pericardial effusion.  Normal caliber of the thoracic aorta given significant pulsation artifact. No definite thoracic aortic dissection or periaortic stranding on this nongated examination. Bovine configuration of the aortic arch is incidentally noted. Branch vessels of the aortic arch appear widely patent throughout their imaged course. Incidental note is made of an azygos nipple.  Review of the MIP images confirms the above findings.   ----------------------------------------------------------------------------------  Nonvascular Findings:  Evaluation of the pulmonary parenchyma is degraded secondary to patient respiratory artifact.  Post gastric band placement with distention of the upstream esophagus and moderate amount of layering debris seen within its mid aspect (image 72, series 4). This finding is associated with ill-defined heterogeneous slightly nodular airspace opacities within the anterior segment of the right upper lobe (representative image 48, series 406 as well as the lateral basilar segment of the right lower lobe (image 68 and posterior basilar segment of the right lower lobe (image 84). The central pulmonary airways appear patent given obliquity. No discrete air bronchograms. No pleural effusion or pneumothorax.  Evaluation for pulmonary nodules is markedly degraded due to patient respiratory artifact. Shotty bilateral hilar lymph nodes with index right suprahilar nodal conglomeration measuring approximately 1.1 cm in greatest short axis diameter (image 51, series 41) and index right infrahilar nodal conglomeration measuring 0.8 cm (image 55) are presumably reactive etiology. No mediastinal, hilar or axillary lymphadenopathy.  Limited evaluation of the upper abdomen demonstrates a partially  exophytic approximately 4 cm hypo attenuating (3 Hounsfield unit) presumed cyst arising from the superior pole of the left kidney, incompletely imaged. Several splenules are also suspected within the right upper abdominal quadrant.  No acute or aggressive osseous abnormalities.  Regional soft tissues appear normal. Normal noncontrast appearance of thyroid gland.  IMPRESSION: 1. No evidence of central pulmonary embolism. Evaluation of the bilateral segmental and subsegmental pulmonary arteries is degraded secondary to suboptimal vessel opacification as well as quantum mottle and  patient motion artifact. If clinical concern persists for pulmonary embolism, further evaluation could be performed with bilateral lower extremity venous Doppler ultrasound. (Note, a V/Q scanning is not advised given the presence of heterogeneous airspace opacities seen on preceding chest radiograph therefore increasing the probability of an indeterminate V/Q scan). 2. Post gastric banding with distension of the upstream esophagus which is noted to contain a moderate amount of debris - this finding is associated with ill-defined heterogeneous airspace opacities within the right upper and lower lobes which are favored to be the sequela of either aspiration and/or infection (including atypical etiologies). 3. Cardiomegaly.   Electronically Signed   By: Sandi Mariscal M.D.   On: 10/20/2014 15:03     CBC  Recent Labs Lab 10/20/14 1222 10/20/14 1308 10/21/14 0635  WBC 8.8  --  8.8  HGB 11.7* 13.6 11.8*  HCT 35.9* 40.0 36.4  PLT 315  --  344  MCV 89.1  --  86.7  MCH 29.0  --  28.1  MCHC 32.6  --  32.4  RDW 14.1  --  14.1  LYMPHSABS 0.6*  --   --   MONOABS 0.5  --   --   EOSABS 0.1  --   --   BASOSABS 0.0  --   --     Chemistries   Recent Labs Lab 10/20/14 1308 10/21/14 0635  NA 139 137  K 3.2* 3.0*  CL 97 99  CO2  --  29  GLUCOSE 109* 87  BUN 7 <5*  CREATININE 0.80 0.73  CALCIUM  --  8.7    ------------------------------------------------------------------------------------------------------------------ CrCl cannot be calculated (Unknown ideal weight.). ------------------------------------------------------------------------------------------------------------------ No results for input(s): HGBA1C in the last 72 hours. ------------------------------------------------------------------------------------------------------------------ No results for input(s): CHOL, HDL, LDLCALC, TRIG, CHOLHDL, LDLDIRECT in the last 72 hours. ------------------------------------------------------------------------------------------------------------------ No results for input(s): TSH, T4TOTAL, T3FREE, THYROIDAB in the last 72 hours.  Invalid input(s): FREET3 ------------------------------------------------------------------------------------------------------------------ No results for input(s): VITAMINB12, FOLATE, FERRITIN, TIBC, IRON, RETICCTPCT in the last 72 hours.  Coagulation profile No results for input(s): INR, PROTIME in the last 168 hours.  No results for input(s): DDIMER in the last 72 hours.  Cardiac Enzymes No results for input(s): CKMB, TROPONINI, MYOGLOBIN in the last 168 hours.  Invalid input(s): CK ------------------------------------------------------------------------------------------------------------------ Invalid input(s): POCBNP     Time Spent in minutes  35   Lala Lund K M.D on 10/21/2014 at 9:54 AM  Between 7am to 7pm - Pager - [phone]  After 7pm go to [email or web address] - password [email or web address]  [facility]   Office  [phone]

## 2014-10-21 NOTE — Progress Notes (Signed)
UR Completed.  [phone]  

## 2014-10-22 LAB — LEGIONELLA ANTIGEN, URINE

## 2014-10-22 LAB — POTASSIUM: POTASSIUM: 3.4 mmol/L — AB (ref 3.5–5.1)

## 2014-10-22 LAB — MAGNESIUM: MAGNESIUM: 2 mg/dL (ref 1.5–2.5)

## 2014-10-22 MED ORDER — POTASSIUM CHLORIDE CRYS ER 20 MEQ PO TBCR
40.0000 meq | EXTENDED_RELEASE_TABLET | ORAL | Status: DC
  Administered 2014-10-22: 40 meq via ORAL
  Filled 2014-10-22: qty 2

## 2014-10-22 MED ORDER — ALBUTEROL SULFATE (2.5 MG/3ML) 0.083% IN NEBU: 2.5000 mg | INHALATION_SOLUTION | RESPIRATORY_TRACT | Status: DC | PRN

## 2014-10-22 MED ORDER — FUROSEMIDE 10 MG/ML IJ SOLN
40.0000 mg | Freq: Once | INTRAMUSCULAR | Status: AC
  Administered 2014-10-22: 40 mg via INTRAVENOUS
  Filled 2014-10-22: qty 4

## 2014-10-22 MED ORDER — DOXYCYCLINE HYCLATE 100 MG PO CAPS: 100.0000 mg | ORAL_CAPSULE | Freq: Two times a day (BID) | ORAL | Status: DC

## 2014-10-22 NOTE — Progress Notes (Signed)
Claudean Kinds Gow to be D/C'd Home per MD order.  Discussed with the patient and all questions fully answered.  VSS, Skin clean, dry and intact without evidence of skin break down, no evidence of skin tears noted. IV catheter discontinued intact. Site without signs and symptoms of complications. Dressing and pressure applied.  An After Visit Summary was printed and given to the patient. Patient received prescription.  D/c education completed with patient/family including follow up instructions, medication list, d/c activities limitations if indicated, with other d/c instructions as indicated by MD - patient able to verbalize understanding, all questions fully answered.   Patient instructed to return to ED, call 911, or call MD for any changes in condition.   Patient escorted via [place], and D/C home via private auto.  L'ESPERANCE, Nyaisha Simao C 10/22/2014 11:39 AM

## 2014-10-22 NOTE — Discharge Instructions (Signed)
Follow with Primary MD DEWEY,ELIZABETH, MD in 7 days   Get CBC, CMP, 2 view Chest X ray checked  by Primary MD next visit.    Activity: As tolerated with Full fall precautions use walker/cane & assistance as needed   Disposition Home     Diet: Heart Healthy   For Heart failure patients - Check your Weight same time everyday, if you gain over 2 pounds, or you develop in leg swelling, experience more shortness of breath or chest pain, call your Primary MD immediately. Follow Cardiac Low Salt Diet and 1.5 lit/day fluid restriction.   On your next visit with your primary care physician please Get Medicines reviewed and adjusted.   Please request your Prim.MD to go over all Hospital Tests and Procedure/Radiological results at the follow up, please get all Hospital records sent to your Prim MD by signing hospital release before you go home.   If you experience worsening of your admission symptoms, develop shortness of breath, life threatening emergency, suicidal or homicidal thoughts you must seek medical attention immediately by calling 911 or calling your MD immediately  if symptoms less severe.  You Must read complete instructions/literature along with all the possible adverse reactions/side effects for all the Medicines you take and that have been prescribed to you. Take any new Medicines after you have completely understood and accpet all the possible adverse reactions/side effects.   Do not drive, operating heavy machinery, perform activities at heights, swimming or participation in water activities or provide baby sitting services if your were admitted for syncope or siezures until you have seen by Primary MD or a Neurologist and advised to do so again.  Do not drive when taking Pain medications.    Do not take more than prescribed Pain, Sleep and Anxiety Medications  Special Instructions: If you have smoked or chewed Tobacco  in the last 2 yrs please stop smoking, stop any regular  Alcohol  and or any Recreational drug use.  Wear Seat belts while driving.   Please note  You were cared for by a hospitalist during your hospital stay. If you have any questions about your discharge medications or the care you received while you were in the hospital after you are discharged, you can call the unit and asked to speak with the hospitalist on call if the hospitalist that took care of you is not available. Once you are discharged, your primary care physician will handle any further medical issues. Please note that NO REFILLS for any discharge medications will be authorized once you are discharged, as it is imperative that you return to your primary care physician (or establish a relationship with a primary care physician if you do not have one) for your aftercare needs so that they can reassess your need for medications and monitor your lab values.

## 2014-10-22 NOTE — Evaluation (Signed)
Clinical/Bedside Swallow Evaluation Patient Details  Name: LYNORE COSCIA MRN: [id number] Date of Birth: [birth year]  Today's Date: 10/22/2014 Time: SLP Start Time (ACUTE ONLY): 3710 SLP Stop Time (ACUTE ONLY): 0854 SLP Time Calculation (min) (ACUTE ONLY): 15 min  Past Medical History:  Past Medical History  Diagnosis Date  . Obesity   . Hypertension   . Uterine fibroid   . Anemia   . Bleeding from the nose   . Sinus congestion     saw ENT, had nasal surgery to clean out nasal area for bacteria, mold  . Vitamin D deficiency   . Adenomyosis 7/07  . H/O blood clots     superficial blood clots with OCPs  . Atrial fibrillation   . Bronchitis    Past Surgical History:  Past Surgical History  Procedure Laterality Date  . Nasal sinus surgery  4/13  . Laparoscopic gastric banding    . Abdominal hysterectomy    . Mouth surgery    . Cesarean section    . Breast biopsy      x2  . Laparoscopy      x10   HPI:  49 year old female admitted with aspiration/HCAP. Pending influenza panel, HIV, strep PNA and legionella antigen. CT chest was performed which demonstrated no evidence of pulmonary emboli but did demonstrate airspace disease in the right upper and lower lobes along with a dilated fluid-filled esophagus. SLP consulted to r/o ongoing aspiration. S/P Lap band APS, 02/18/2009.  Distention of upstream esophagus with moderate debris on CT scan. General surgery was consulted and gastric band pressure was released by bedside fluid removal.    Assessment / Plan / Recommendation Clinical Impression  Bedside swallow evaluation complete. Patient presents wtih an intact oropharyngeal swallow without overt indication of aspiration. Suspect that aspiration PNA is related to esophageal dysfunction given GER associated with Lap Band. Patient able to independently verbalize proper eating strategies to mitigate risk of aspiration but admits to difficulty carrying over into normal ADLs. Encouraged patient  to utilize techniques as noted by surgical MD to decrease risk of recurrent PNA. No further SLP needs indicated. Although not observed with solids given current dietary restrictions, oropharyngeal swallowing function appears [facility] for advancement to solid pos when primary MD feels appropriate.     Aspiration Risk  Moderate    Diet Recommendation Regular;Thin liquid   Liquid Administration via: Cup;Straw Medication Administration: Whole meds with liquid Supervision: Patient able to self feed Compensations: Slow rate;Small sips/bites;Follow solids with liquid Postural Changes and/or Swallow Maneuvers: Seated upright 90 degrees;Upright 30-60 min after meal    Other  Recommendations Oral Care Recommendations: Oral care BID   Follow Up Recommendations  None    Frequency and Duration        Pertinent Vitals/Pain n/a     Swallow Study    General HPI: 49 year old female admitted with aspiration/HCAP. Pending influenza panel, HIV, strep PNA and legionella antigen. CT chest was performed which demonstrated no evidence of pulmonary emboli but did demonstrate airspace disease in the right upper and lower lobes along with a dilated fluid-filled esophagus. SLP consulted to r/o ongoing aspiration. S/P Lap band APS, 02/18/2009.  Distention of upstream esophagus with moderate debris on CT scan. General surgery was consulted and gastric band pressure was released by bedside fluid removal.  Type of Study: Bedside swallow evaluation Previous Swallow Assessment: none Diet Prior to this Study: Thin liquids (clear liquid) Temperature Spikes Noted: Yes Respiratory Status: Room air History of Recent  Intubation: No Behavior/Cognition: Alert;Cooperative;Pleasant mood Oral Cavity - Dentition: Adequate natural dentition Self-Feeding Abilities: Able to feed self Patient Positioning: Upright in bed Baseline Vocal Quality: Hoarse Volitional Cough: Strong Volitional Swallow: Able to elicit    Oral/Motor/Sensory  Function Overall Oral Motor/Sensory Function: Appears within functional limits for tasks assessed   Ice Chips Ice chips: Not tested   Thin Liquid Thin Liquid: Within functional limits Presentation: Cup;Self Fed;Straw    Nectar Thick Nectar Thick Liquid: Not tested   Honey Thick Honey Thick Liquid: Not tested   Puree Puree: Not tested   Solid   GO   Dontae Minerva MA, CCC-SLP [phone]  Solid: Not tested       Shawnda Mauney Meryl 10/22/2014,9:02 AM

## 2014-10-22 NOTE — Discharge Summary (Signed)
[name], is a 49 y.o. female  DOB 1965-10-18  MRN [id number].  Admission date:  10/20/2014  Admitting Physician  Louellen Molder, MD  Discharge Date:  10/22/2014   Primary MD  Rachell Cipro, MD  Recommendations for primary care physician for things to follow:   Check CBC, BMP and a 2 view chest x-ray in a week. Must follow with her primary cardiologist Dr. Einar Gip in 1-2 weeks.   Admission Diagnosis  Aspiration pneumonia, unspecified aspiration pneumonia type [J69.0]   Discharge Diagnosis  Aspiration pneumonia, unspecified aspiration pneumonia type [J69.0]     Principal Problem:   Aspiration pneumonia Active Problems:   Lap band APS, 02/18/2009   Atrial fibrillation   Hypertension   Compartment syndrome of lower extremity      Past Medical History  Diagnosis Date  . Obesity   . Hypertension   . Uterine fibroid   . Anemia   . Bleeding from the nose   . Sinus congestion     saw ENT, had nasal surgery to clean out nasal area for bacteria, mold  . Vitamin D deficiency   . Adenomyosis 7/07  . H/O blood clots     superficial blood clots with OCPs  . Atrial fibrillation   . Bronchitis     Past Surgical History  Procedure Laterality Date  . Nasal sinus surgery  4/13  . Laparoscopic gastric banding    . Abdominal hysterectomy    . Mouth surgery    . Cesarean section    . Breast biopsy      x2  . Laparoscopy      x2       History of present illness and  Hospital Course:     Kindly see H&P for history of present illness and admission details, please review complete Labs, Consult reports and Test reports for all details in brief  HPI  from the history and physical done on the day of admission   49 year old female with morbid obesity status post lap banding in 2010, history of DVT and  paroxysmal A. fib on chronic anticoagulation, hypertension, chronic sinusitis, recent hospitalization 2 months back for compartment syndrome of right lower extremity is managed conservatively ( seen by Dr. Fredonia Highland) presented to the ED with cough with yellowish-brown productive phlegm and worsening shortness of breath with fever since this morning. Also reports frontal headaches. Patient reports that for the past 4 weeks she has been having cough with productive phlegm, wheezy and has been on 2 different antibiotics prescribed by her PCP along with a course of prednisone. She reports that her symptoms have not improved and she has been off work for the past 2 weeks. She reports recent travel 4 weeks back but denies any sick contacts. Patient denies dizziness, nausea , vomiting, chest pain, palpitations, abdominal pain, bowel or urinary symptoms. Denies change in weight or appetite.  Course in the ED  patient was febrile to 10 42F, remaining vitals were stable. Blood will done showed normal WBC, hb of  13.6, normal platelets. Chemistry showed sodium 139, K of 3.2, normal renal function.  Chest x-ray showed right lower lobe pneumonia. Head CT done given headache with fever was unremarkable except for chronic left maxillary sinus mucoperiosteal thickening. CT angiogram of the chest was done was negative for central PE but bilateral segmental evidence of pulmonary arteries were not clearly visualized. Also showed post gastric banding with distention of the upstream esophagus with moderate debris.   Hospital Course    Aspiration/ health care associated Pneumonia Improved with empiric IV antibiotics, now off oxygen no shortness of breath, will be placed on oral doxycycline for 7 more days, request PCP to repeat CBC, BMP and a 2 view chest x-ray in a week. She had negative influenza panel, HIV and strep pneumonia urinary antigen , pending legionella antigen. He was evaluated by speech therapy she is  cleared for regular diet.     Lap band APS, 02/18/2009 Distention of upstream esophagus with moderate debris on CT scan. General surgery was consulted and gastric band pressure was released by bedside fluid removal. Stable now.     Paroxysmal A. fib Rate controlled. Continue Cardizem, B.Blocker and edoxaban   Essential hypertension Blood pressure control. Resumed home medications   Compartment syndrome of right lower extremity Recently hospitalized and managed nonsurgically. Follows with Dr. Percell Miller as outpatient. Had repeat Doppler ultrasound of the right leg done on 2/26 without evidence for DVT or thrombophlebitis and showed complex cyst possible for ruptured Baker's cyst versus hematoma. Clinically stable.   Hypokalemia Replenish, recheck in an week by PCP.     Discharge Condition: Stable   Follow UP  Follow-up Information    Follow up with [facility], MD. Schedule an appointment as soon as possible for a visit in 1 week.   Specialty:  Family Medicine   Contact information:   [address] [place] [address] [phone]       Follow up with Laverda Page, MD. Schedule an appointment as soon as possible for a visit in 1 week.   Specialty:  Cardiology   Contact information:   [address] [place] [address] [phone]         Discharge Instructions  and  Discharge Medications      Discharge Instructions    Diet - low sodium heart healthy    Complete by:  As directed      Discharge instructions    Complete by:  As directed   Follow with Primary MD DEWEY,ELIZABETH, MD in 7 days   Get CBC, CMP, 2 view Chest X ray checked  by Primary MD next visit.    Activity: As tolerated with Full fall precautions use walker/cane & assistance as needed   Disposition Home     Diet: Heart Healthy   For Heart failure patients - Check your Weight same time everyday, if you gain over 2 pounds, or you develop in leg swelling,  experience more shortness of breath or chest pain, call your Primary MD immediately. Follow Cardiac Low Salt Diet and 1.5 lit/day fluid restriction.   On your next visit with your primary care physician please Get Medicines reviewed and adjusted.   Please request your Prim.MD to go over all Hospital Tests and Procedure/Radiological results at the follow up, please get all Hospital records sent to your Prim MD by signing hospital release before you go home.   If you experience worsening of your admission symptoms, develop shortness of breath, life threatening  emergency, suicidal or homicidal thoughts you must seek medical attention immediately by calling 911 or calling your MD immediately  if symptoms less severe.  You Must read complete instructions/literature along with all the possible adverse reactions/side effects for all the Medicines you take and that have been prescribed to you. Take any new Medicines after you have completely understood and accpet all the possible adverse reactions/side effects.   Do not drive, operating heavy machinery, perform activities at heights, swimming or participation in water activities or provide baby sitting services if your were admitted for syncope or siezures until you have seen by Primary MD or a Neurologist and advised to do so again.  Do not drive when taking Pain medications.    Do not take more than prescribed Pain, Sleep and Anxiety Medications  Special Instructions: If you have smoked or chewed Tobacco  in the last 2 yrs please stop smoking, stop any regular Alcohol  and or any Recreational drug use.  Wear Seat belts while driving.   Please note  You were cared for by a hospitalist during your hospital stay. If you have any questions about your discharge medications or the care you received while you were in the hospital after you are discharged, you can call the unit and asked to speak with the hospitalist on call if the hospitalist that took  care of you is not available. Once you are discharged, your primary care physician will handle any further medical issues. Please note that NO REFILLS for any discharge medications will be authorized once you are discharged, as it is imperative that you return to your primary care physician (or establish a relationship with a primary care physician if you do not have one) for your aftercare needs so that they can reassess your need for medications and monitor your lab values.     Increase activity slowly    Complete by:  As directed             Medication List    TAKE these medications        acetaminophen 650 MG CR tablet  Commonly known as:  TYLENOL  Take 650 mg by mouth every 8 (eight) hours as needed for pain.     albuterol (2.5 MG/3ML) 0.083% nebulizer solution  Commonly known as:  PROVENTIL  Take 3 mLs (2.5 mg total) by nebulization every 2 (two) hours as needed for wheezing.     ALPRAZolam 0.5 MG tablet  Commonly known as:  XANAX  Take 0.5 mg by mouth 3 (three) times daily as needed. For anxiety     benzonatate 200 MG capsule  Commonly known as:  TESSALON  Take 200 mg by mouth 3 (three) times daily as needed for cough.     budesonide-formoterol 160-4.5 MCG/ACT inhaler  Commonly known as:  SYMBICORT  Inhale 2 puffs into the lungs 2 (two) times daily.     CARTIA XT 180 MG 24 hr capsule  Generic drug:  diltiazem  Take 180 mg by mouth daily.     DEXILANT 60 MG capsule  Generic drug:  dexlansoprazole  Take 60 mg by mouth daily.     doxycycline 100 MG capsule  Commonly known as:  VIBRAMYCIN  Take 1 capsule (100 mg total) by mouth 2 (two) times daily.     escitalopram 20 MG tablet  Commonly known as:  LEXAPRO  Take 20 mg by mouth daily.     flecainide 150 MG tablet  Commonly known as:  [employer]  Take 150 mg by mouth 2 (two) times daily.     loratadine 10 MG tablet  Commonly known as:  CLARITIN  Take 10 mg by mouth daily.     metoprolol succinate 50 MG 24 hr  tablet  Commonly known as:  TOPROL-XL  Take 50 mg by mouth daily. Take with or immediately following a meal.     olmesartan-hydrochlorothiazide 40-25 MG per tablet  Commonly known as:  BENICAR HCT  Take 1 tablet by mouth daily.     SAVAYSA 60 MG Tabs tablet  Generic drug:  edoxaban  Take 60 mg by mouth daily.          Diet and Activity recommendation: See Discharge Instructions above   Consults obtained - none   Major procedures and Radiology Reports - PLEASE review detailed and final reports for all details, in brief -       Dg Chest 2 View  10/20/2014   CLINICAL DATA:  Respiratory distress and fever.  EXAM: CHEST  2 VIEW  COMPARISON:  04/13/2013  FINDINGS: Heart size appears normal. No pleural effusion identified. Right lung base opacities are identified compatible with pneumonia. Left lung is clear.  IMPRESSION: 1. Right lower lobe pneumonia.   Electronically Signed   By: Kerby Moors M.D.   On: 10/20/2014 13:37   Ct Head Wo Contrast  10/20/2014   CLINICAL DATA:  Headache, fever and cough  EXAM: CT HEAD WITHOUT CONTRAST  TECHNIQUE: Contiguous axial images were obtained from the base of the skull through the vertex without intravenous contrast.  COMPARISON:  02/21/2012  FINDINGS: No acute cortical infarct, hemorrhage, or mass lesion is present. Ventricles are of normal size. No significant extra-axial fluid collection is present. Chronic left maxillary sinus mucoperiosteal thickening is identified status post median antrectomy. There is also partial opacification of the sphenoid sinus. The mastoid air cells are clear. The calvarium appears intact. The osseous skull is intact.  IMPRESSION: 1. No acute intracranial abnormalities. 2. Chronic left maxillary sinus mucoperiosteal thickening status post median antrectomy.   Electronically Signed   By: Kerby Moors M.D.   On: 10/20/2014 13:36   Ct Angio Chest Pe W/cm &/or Wo Cm  10/20/2014   CLINICAL DATA:  Respiratory distress.  Fever, cough and vomiting. Evaluate for pulmonary embolism.  EXAM: CT ANGIOGRAPHY CHEST WITH CONTRAST  TECHNIQUE: Multidetector CT imaging of the chest was performed using the standard protocol during bolus administration of intravenous contrast. Multiplanar CT image reconstructions and MIPs were obtained to evaluate the vascular anatomy.  CONTRAST:  100 cc Omnipaque 300  COMPARISON:  Chest radiograph - earlier same day; 04/13/2013  FINDINGS: Vascular Findings:  There is suboptimal opacification of the pulmonary arterial system with the main pulmonary artery measuring only 180 Hounsfield units). There are no discrete filling defects within the central pulmonary arterial tree. Evaluation of the bilateral segmental and subsegmental pulmonary arteries is degraded secondary to a combination of suboptimal vessel opacification, patient respiratory artifact as well as quantum mottle artifact due to patient body habitus. Normal caliber of the main pulmonary artery.  Cardiomegaly.  No pericardial effusion.  Normal caliber of the thoracic aorta given significant pulsation artifact. No definite thoracic aortic dissection or periaortic stranding on this nongated examination. Bovine configuration of the aortic arch is incidentally noted. Branch vessels of the aortic arch appear widely patent throughout their imaged course. Incidental note is made of an azygos nipple.  Review of the MIP images confirms the above findings.   ----------------------------------------------------------------------------------  Nonvascular Findings:  Evaluation of the pulmonary parenchyma is degraded secondary to patient respiratory artifact.  Post gastric band placement with distention of the upstream esophagus and moderate amount of layering debris seen within its mid aspect (image 72, series 4). This finding is associated with ill-defined heterogeneous slightly nodular airspace opacities within the anterior segment of the right upper lobe  (representative image 48, series 406 as well as the lateral basilar segment of the right lower lobe (image 68 and posterior basilar segment of the right lower lobe (image 84). The central pulmonary airways appear patent given obliquity. No discrete air bronchograms. No pleural effusion or pneumothorax.  Evaluation for pulmonary nodules is markedly degraded due to patient respiratory artifact. Shotty bilateral hilar lymph nodes with index right suprahilar nodal conglomeration measuring approximately 1.1 cm in greatest short axis diameter (image 51, series 41) and index right infrahilar nodal conglomeration measuring 0.8 cm (image 55) are presumably reactive etiology. No mediastinal, hilar or axillary lymphadenopathy.  Limited evaluation of the upper abdomen demonstrates a partially exophytic approximately 4 cm hypo attenuating (3 Hounsfield unit) presumed cyst arising from the superior pole of the left kidney, incompletely imaged. Several splenules are also suspected within the right upper abdominal quadrant.  No acute or aggressive osseous abnormalities.  Regional soft tissues appear normal. Normal noncontrast appearance of thyroid gland.  IMPRESSION: 1. No evidence of central pulmonary embolism. Evaluation of the bilateral segmental and subsegmental pulmonary arteries is degraded secondary to suboptimal vessel opacification as well as quantum mottle and patient motion artifact. If clinical concern persists for pulmonary embolism, further evaluation could be performed with bilateral lower extremity venous Doppler ultrasound. (Note, a V/Q scanning is not advised given the presence of heterogeneous airspace opacities seen on preceding chest radiograph therefore increasing the probability of an indeterminate V/Q scan). 2. Post gastric banding with distension of the upstream esophagus which is noted to contain a moderate amount of debris - this finding is associated with ill-defined heterogeneous airspace opacities  within the right upper and lower lobes which are favored to be the sequela of either aspiration and/or infection (including atypical etiologies). 3. Cardiomegaly.   Electronically Signed   By: Sandi Mariscal M.D.   On: 10/20/2014 15:03    Micro Results      Recent Results (from the past 240 hour(s))  Culture, blood (routine x 2) Call MD if unable to obtain prior to antibiotics being given     Status: None (Preliminary result)   Collection Time: 10/20/14  5:28 PM  Result Value Ref Range Status   Specimen Description HAND LEFT  Final   Special Requests BOTTLES DRAWN AEROBIC ONLY 10 CC  Final   Culture   Final           BLOOD CULTURE RECEIVED NO GROWTH TO DATE CULTURE WILL BE HELD FOR 5 DAYS BEFORE ISSUING A FINAL NEGATIVE REPORT Performed at [facility]    Report Status PENDING  Incomplete  Culture, blood (routine x 2) Call MD if unable to obtain prior to antibiotics being given     Status: None (Preliminary result)   Collection Time: 10/20/14  5:33 PM  Result Value Ref Range Status   Specimen Description HAND RIGHT  Final   Special Requests BOTTLES DRAWN AEROBIC ONLY 10 CC  Final   Culture   Final           BLOOD CULTURE RECEIVED NO GROWTH TO DATE CULTURE WILL BE HELD FOR 5 DAYS BEFORE ISSUING A FINAL NEGATIVE  REPORT Performed at [facility]    Report Status PENDING  Incomplete       Today   Subjective:   Joia Doyle today has no headache,no chest abdominal pain,no new weakness tingling or numbness, feels much better wants to go home today.   Objective:   Blood pressure 127/76, pulse 70, temperature 98.4 F (36.9 C), temperature source Oral, resp. rate 20, height 5\' 9"  (1.753 m), weight 118.661 kg (261 lb 9.6 oz), last menstrual period 08/10/2005, SpO2 96 %.   Intake/Output Summary (Last 24 hours) at 10/22/14 1056 Last data filed at 10/22/14 0959  Gross per 24 hour  Intake   1390 ml  Output   2400 ml  Net  -1010 ml    Exam Awake Alert, Oriented x  3, No new F.N deficits, Normal affect [place].AT,PERRAL Supple Neck,No JVD, No cervical lymphadenopathy appriciated.  Symmetrical Chest wall movement, Good air movement bilaterally, CTAB RRR,No Gallops,Rubs or new Murmurs, No Parasternal Heave +ve B.Sounds, Abd Soft, Non tender, No organomegaly appriciated, No rebound -guarding or rigidity. No Cyanosis, Clubbing or edema, No new Rash or bruise  Data Review   CBC w Diff: Lab Results  Component Value Date   WBC 8.8 10/21/2014   HGB 11.8* 10/21/2014   HCT 36.4 10/21/2014   PLT 344 10/21/2014   LYMPHOPCT 7* 10/20/2014   MONOPCT 5 10/20/2014   EOSPCT 1 10/20/2014   BASOPCT 0 10/20/2014    CMP: Lab Results  Component Value Date   NA 137 10/21/2014   K 3.4* 10/22/2014   CL 99 10/21/2014   CO2 29 10/21/2014   BUN <5* 10/21/2014   CREATININE 0.73 10/21/2014   PROT 6.8 04/24/2012   ALBUMIN 3.6 04/24/2012   BILITOT 0.5 04/24/2012   ALKPHOS 59 04/24/2012   AST 13 04/24/2012   ALT 11 04/24/2012  .   Total Time in preparing paper work, data evaluation and todays exam - 35 minutes  Thurnell Lose M.D on 10/22/2014 at 10:56 AM  [facility]   Office  [phone]

## 2014-10-26 LAB — CULTURE, BLOOD (ROUTINE X 2)
Culture: NO GROWTH
Culture: NO GROWTH

## 2015-08-06 ENCOUNTER — Other Ambulatory Visit: Payer: Self-pay | Admitting: Family Medicine

## 2015-08-06 DIAGNOSIS — N6320 Unspecified lump in the left breast, unspecified quadrant: Secondary | ICD-10-CM

## 2015-08-08 ENCOUNTER — Ambulatory Visit
Admission: RE | Admit: 2015-08-08 | Discharge: 2015-08-08 | Disposition: A | Discharge status: Home / Self Care | Payer: BLUE CROSS/BLUE SHIELD | Source: Ambulatory Visit | Attending: Family Medicine | Admitting: Family Medicine

## 2015-08-08 DIAGNOSIS — N6320 Unspecified lump in the left breast, unspecified quadrant: Secondary | ICD-10-CM

## 2015-12-04 ENCOUNTER — Encounter: Payer: Self-pay | Admitting: Podiatry

## 2015-12-04 ENCOUNTER — Ambulatory Visit (INDEPENDENT_AMBULATORY_CARE_PROVIDER_SITE_OTHER): Payer: BLUE CROSS/BLUE SHIELD | Admitting: Podiatry

## 2015-12-04 ENCOUNTER — Ambulatory Visit (INDEPENDENT_AMBULATORY_CARE_PROVIDER_SITE_OTHER): Payer: BLUE CROSS/BLUE SHIELD

## 2015-12-04 VITALS — BP 142/91 | HR 74 | Resp 16 | Ht 69.0 in | Wt 269.0 lb

## 2015-12-04 DIAGNOSIS — M7660 Achilles tendinitis, unspecified leg: Secondary | ICD-10-CM

## 2015-12-04 DIAGNOSIS — M779 Enthesopathy, unspecified: Secondary | ICD-10-CM

## 2015-12-04 MED ORDER — TRIAMCINOLONE ACETONIDE 10 MG/ML IJ SUSP
10.0000 mg | Freq: Once | INTRAMUSCULAR | Status: AC
  Administered 2015-12-04: 10 mg

## 2015-12-04 NOTE — Progress Notes (Signed)
Subjective:     [name], female   DOB: 1966/03/21, 50 y.o.   MRN: [id number]  HPI patient presents stating that she's had a lot of pain in her Achilles tendon for about 4 months and has undergone 3 months of physical therapy with mild improvement of symptoms. States that the right one hurts worse than the left and that it's more on the medial and central side and the lateral side and she is only been able to wear tennis shoes and is not been able to exercise   Review of Systems  All other systems reviewed and are negative.      Objective:   Physical Exam  Constitutional: She is oriented to person, place, and time.  Cardiovascular: Intact distal pulses.   Musculoskeletal: Normal range of motion.  Neurological: She is oriented to person, place, and time.  Skin: Skin is warm.  Nursing note and vitals reviewed.  neurovascular status intact muscle strength adequate range of motion within normal limits with patient found to have quite a bit of posterior heel pain right medial side with inflammation and fluid buildup and moderate discomfort on the left posterior heel medial and central side. There is no equinus condition and there is mild erythema surrounding the area and no pain at the musculotendinous junction. Patient has good digital perfusion and is well oriented 3     Assessment:     Acute Achilles tendinitis right over left    Plan:     H&P condition reviewed with patient. Today I went ahead and I discussed considerations for shockwave possible surgery in the future or other treatments and we've opted for a conservative aggressive treatment consisting of injection and I did explain her the chances or rupture associated with injection treatment. I carefully injected the medial side with 3 mg Dexon some Kenalog 5 mg Xylocaine and not putting it directly into the Achilles tendon and she will wear her air fracture walker that had been dispensed to her from previous position.  She'll be seen back again in 2-3 weeks and we may consider shockwave at that time  X-ray report indicate spurring of the posterior heel right and left with no other pathology noted

## 2015-12-04 NOTE — Patient Instructions (Signed)

## 2015-12-04 NOTE — Progress Notes (Signed)
   Subjective:    [name], female    DOB: [birth year], 50 y.o.   MRN: [id number]  HPI Chief Complaint  Patient presents with  . Foot Pain    Bilateral; back of heel; pt stated, "See Dr. Noemi Chapel (orthopedic) getting physical therapy twice a week; Also doing electronic shockwave treatment with relief";  x3.5 months    After treatments at [facility], uses a topical solution for 12 hours. Pt is happy with Dr. Noemi Chapel, just wants to have feet looked at to make sure they are doing everything they need to to help their feet.    Review of Systems  HENT: Positive for sinus pressure.   Musculoskeletal: Positive for arthralgias.  All other systems reviewed and are negative.      Objective:   Physical Exam        Assessment & Plan:

## 2015-12-18 ENCOUNTER — Encounter: Payer: Self-pay | Admitting: Podiatry

## 2015-12-18 ENCOUNTER — Ambulatory Visit (INDEPENDENT_AMBULATORY_CARE_PROVIDER_SITE_OTHER): Payer: BLUE CROSS/BLUE SHIELD | Admitting: Podiatry

## 2015-12-18 DIAGNOSIS — M7660 Achilles tendinitis, unspecified leg: Secondary | ICD-10-CM | POA: Diagnosis not present

## 2015-12-18 MED ORDER — TRIAMCINOLONE ACETONIDE 10 MG/ML IJ SUSP
10.0000 mg | Freq: Once | INTRAMUSCULAR | Status: AC
  Administered 2015-12-18: 10 mg

## 2015-12-18 MED ORDER — NONFORMULARY OR COMPOUNDED ITEM: Status: DC

## 2015-12-18 NOTE — Progress Notes (Signed)
Subjective:     [name], female   DOB: [birth year], 50 y.o.   MRN: [id number]  HPI patient states she's improved but she has quite a bit of pain in the left posterior heel and she still has trouble at nighttime when she lays down or sits and she still worried that this can become back   Review of Systems     Objective:   Physical Exam Neurovascular status intact with continued Achilles tendinitis that's now more present in the left over the right medial side with improvement of the right from previous treatment    Assessment:     Achilles tendinitis bilateral which appears to be improving somewhat secondary to treatment    Plan:     Discussed careful injection left as the right one did very well and she does understand risk with this and today I carefully injected the medial side 3 mg Dexon some Kenalog and advised on boot usage. I dispensed night splints to wear in order to try to stretch the Achilles tendon and plantar fascial along with ice packs and she will wear those at nighttime and I instructed on exercises continued anti-inflammatory usage possible shockwave therapy and I dispensed heel lifts. Reappoint in 4 weeks to reevaluate

## 2016-01-08 ENCOUNTER — Encounter: Payer: Self-pay | Admitting: Podiatry

## 2016-01-08 ENCOUNTER — Ambulatory Visit (INDEPENDENT_AMBULATORY_CARE_PROVIDER_SITE_OTHER): Payer: BLUE CROSS/BLUE SHIELD | Admitting: Podiatry

## 2016-01-08 DIAGNOSIS — M779 Enthesopathy, unspecified: Secondary | ICD-10-CM

## 2016-01-08 DIAGNOSIS — M7660 Achilles tendinitis, unspecified leg: Secondary | ICD-10-CM | POA: Diagnosis not present

## 2016-01-08 NOTE — Progress Notes (Signed)
Subjective:     [name], female   DOB: 1966/04/02, 50 y.o.   MRN: [id number]  HPI patient states my heels are feeling a lot better in the back and I'm having minimal discomfort   Review of Systems     Objective:   Physical Exam Neurovascular status intact with significant diminishment of discomfort posterior aspect heel region bilateral with fluid buildup    Assessment:     Tendinitis reduced Achilles bilateral    Plan:     Dispensed a compression stocking with silicone reduce pressure on the posterior heel and advised on physical therapy. Reappoint for [place] to recheck

## 2016-05-07 ENCOUNTER — Other Ambulatory Visit (HOSPITAL_COMMUNITY): Payer: Self-pay | Admitting: Physician Assistant

## 2016-05-07 DIAGNOSIS — Z4651 Encounter for fitting and adjustment of gastric lap band: Secondary | ICD-10-CM

## 2016-05-13 ENCOUNTER — Ambulatory Visit (HOSPITAL_COMMUNITY)
Admission: RE | Admit: 2016-05-13 | Discharge: 2016-05-13 | Disposition: A | Discharge status: Home / Self Care | Payer: BLUE CROSS/BLUE SHIELD | Source: Ambulatory Visit | Attending: Physician Assistant | Admitting: Physician Assistant

## 2016-05-13 DIAGNOSIS — K224 Dyskinesia of esophagus: Secondary | ICD-10-CM | POA: Insufficient documentation

## 2016-05-13 DIAGNOSIS — Z4651 Encounter for fitting and adjustment of gastric lap band: Secondary | ICD-10-CM | POA: Diagnosis not present

## 2016-06-12 NOTE — H&P (Signed)
[facility] [address] [place], [place] [address] [phone] A Division of [facility]  RE:        [name], [name] [id number]                 DOB:  07-21-1966 06-08-16  Reason for visit:  Continued evaluation of bilateral knee severe anteromedial osteoarthritis.  HPI:   This is a chronic nontraumatic problem that has been going on for 3 or more years. It significantly affects her activities of daily living. She has significant start up pain. She has had multiple attempts at cortisone injection, Synvisc injections. She is not able to take po NSAIDS but has been utilizing Voltaren and Pennsaid. All these with modest results. She has also been modifying her activity, trying stationary bike, and icing.    Past medical history: A-fib, on Saviasa, obesity, hypertension, remote history of superficial blood clots on OCPs.   Past surgical history: C-section in 2001. Noncancerous lumpectomies in 1995 and 1997. Partial hysterectomy in 2012. Lap band in 2013.  Allergies: Flagyl, results in tongue swelling.  Medications: Albuterol sulfate 2 puffs q. 4 hours as needed. Albuterol sulfate nebulizer solution 1.5 mg/34mL as needed. Xanax 0.5 mg 1 tablet by mouth at bedtime. Budesonide, formoterol fumarate, 160/4.5 mcg/ACT 2 inhalations twice daily. Vitamin D3 5000 units 2 tablets daily. Clotrimazole 1% topical as needed. Delexant soprozole 60 mg 1 once daily. Diclofenac 1% gel 4 grams four times a day as needed. Diltiazem HCL 180 mg once daily. Diphenhydramine 20 mg one half to one tablet at bedtime as needed. Doxepin oxalate, Saviasa 60 mg 1 once daily. Ace talopram oxalate 20 mg 1 once daily Fluconide acetate 150 mg 1 tablet twice daily. Claritin 10 mg once daily. Metoprolol succinate 50 mg 1 once daily. Nystatin topical 100,000 units/gram one application topically twice daily as needed. Omasartan, Minoxidil, hydrochlorothiazide  (Benicar HCT) 40/25 mg 1 once daily. Ranitidine HCL 150 mg twice daily.  Social history: She is divorced. 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 Never smoked. Drinks 2-3 glasses of wine per week.  Family history: Mother with heart disease and hypertension. Father with hypertension and history of cancer. Brother with high BP. Grandparents with heart disease, high BP, and cancer.  Review of systems: Currently denies lightheadedness, dizziness, fever, chills, chest pain, shortness of breath. No history of CVA, myocardial infarction, PE, or heart catheterization. No loose teeth or dentures.   All other systems have been reviewed and are otherwise currently negative with the exception of those in HPI.  OBJECTIVE:   Height 5'9 ". Weight 272 pounds. BP 119/73. Pulse 57. Temp 97.8. 02 sat 97% on room air. Alert, in no acute distress. Antalgic gait. Walks unaided. EOMI. Good neck extension. No increased work of breathing. Lungs clear to auscultation anterior and posterior without crackle or wheeze. Heart rate is regular; 2/6 systolic murmur. Abdomen protuberant, soft, nontender, nondistended.  Neurovascularly without gross focal deficit. No lesions in the area of chief complaint. Right knee without lesion or erythema. No effusion or sign of infection or varus deformity. Stable to varus and valgus stresses. Range of motion is 0-115 degrees. 5/5 strength. Neurovascularly intact. EHL and FHL, dorsiflexion and plantar flexion, intact. Significant medial joint line tenderness.   IMAGES:   Plain radiographs demonstrate severe anteromedial osteoarthritis with good medial opening on stress views.   ASSESSMENT/PLAN:   Right unicompartmental knee arthroplasty. The patient's history, physical exam, clinical judgement of the provider, and images are  consistent with end stage degenerative joint disease and unicompartmental joint arthroplasty, deemed medically necessary. Treatment options including medical management,  injection therapy, arthroplasty discussed at length. Risks of the procedures were presented and reviewed. Risks of nonoperative treatment versus surgical intervention including but not limited to continued pain, aseptic loosening, stiffness, dislocation/subluxation, infection and bleeding, nerve injury, blood clots, cardiopulmonary complications, morbidity, mortality, among other issues discussed. The patient verbalizes understanding and wishes to proceed. Dental prophylaxis discussed and recommended or 2 years postoperatively.   She does not meet criteria for TXA via IV due to history of DVT, however the patient was 16 and on OCPs at the time.  She will hold her Saviasa per cardiology 2 days prior to surgery and will resume for DVT prophylaxis and her A-fib postoperatively. This in in addition to SCDs and early ambulation. She is going to be discharged home with outpatient physical therapy.    Ernesta Amble.  Percell Miller, M.D. Dictated by Lemar Lofty, PA-C  Electronically verified by Ernesta Amble Percell Miller, M.D.  TDM(HCM): vf D 06-08-2016 T 06-10-2016

## 2016-06-14 NOTE — H&P (Signed)
[name]. [facility]  Location: [facility] Patient #: [id number] DOB: 29-Jan-1966 Divorced / Language: English / Race: Black or African American Female  History of Present Illness   Patient words: lap band.   The patient is a 50 year old female is here for LapBand followup.  Her PCP is [name]  She comes by herself.  [name] is a 14 year who had an AP-Standard lap band placed in 02/18/2009. Her initial weight was 312, BMI - 46.3. She last saw [name] 05/07/2016 - who removed all the fluid from her lap band. An UGI on 05/13/2016 showed slow emptying of the esophagus. The band location looks okay. However, I can go to a chest CT that she had on 20 October 2014 with distention upstream in the esophagus. [name] noted because of airspace disease this could be aspiration.  [name] has complaints of: 1) She has spasms in her chest which are painful. She occasionally has vomiting. To her there is no rhyme or reason for the spasms. They happened 3-4 times a week. Her regurgitation is better since the fluid has been removed from her band. 2) She has halitosis which she is conscientious of. 3) She saw [name] in the 2000s and was told that she had "slow digestion".  Putting this altogether. I think the first episode to an upper endoscopy to evaluate her esophagus, gastric pouch, and stomach. Secondly, I think a lap band is failed her for significant weight loss without symptoms and she is interested in looking at alternative weight loss surgery. We will give her EMMI videos on the sleeve gastrectomy and gastric bypass and I encouraged her to an information session again. She'll see [name] to see what her insurance requirements are.   New problems: A Fib - followed by [name] Halitosis - reason Partial right knee replacement - 06/25/2016 - [name] said that she  had "slow digestion" in the late 2000's  Past Medical History: 1. OSA - on CPAP since around 2015. 2. GERD On Prilosec 40 mg BID. About the same. 3. Hypertension 4. History of bronchitis 5. Sinus trouble - Sinus surgery by [name], about 04/10/2012 - she has done well with this 6. Left knee injury She is still dealing with this and this has slowed her physical activity down a lot. I think this is a major contributor to her weight regain and her disposition.  Social History: Divorced.  Has two girls (10 and 12). Works for [employer]. 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 We talked that [employer] has rights to a lot of [employer]. She went to [facility].  Allergies (Pamela Eversole, LPN; QA348G QA348G AM) Levaquin *FLUOROQUINOLONES* Flagyl *ANTI-INFECTIVE AGENTS - MISC.*  Medication History (Pamela Eversole, LPN; QA348G QA348G AM) Xanax (0.5MG  Tablet, Oral as needed) Active. Cartia XT (180MG  Capsule ER 24HR, Oral daily) Active. Dexilant (60MG  Capsule DR, Oral daily) Active. Toprol XL (50MG  Tablet ER 24HR, Oral daily) Active. Benicar HCT (40-25MG  Tablet, Oral daily) Active. Savaysa (60MG  Tablet, Oral daily) Active. Medications Reconciled  Vitals (Pamela Eversole LPN; QA348G 624THL AM) 06/04/2016 8:45 AM Weight: 277.2 lb Height: 69in Body Surface Area: 2.37 m Body Mass Index: 40.93 kg/m  Temp.: 98.11F(Oral)  Pulse: 66 (Regular)  BP: 144/82 (Sitting, Left Arm, Standard)  Physical Exam  General: alert and generally healthy appearing. HEENT: Normal. Pupils equal.  Neck: Supple. No mass. No thyroid mass.  Lymph Nodes: No  supraclavicular or cervical nodes.  Lungs: Clear to auscultation and symmetric breath sounds. Heart: RRR. No murmur or rub.  Abdomen: Soft. No mass. No tenderness. No hernia. Normal bowel sounds. Post is in good  location. Rectal: Not done.  Extremities: Good strength and ROM in upper and lower extremities.   Assessment & Plan  1.  ODYNOPHAGIA (R13.10) 2.  HISTORY OF LAPAROSCOPIC ADJUSTABLE GASTRIC BANDING (Z98.84)  Story: APS Lap Band - 02/16/2009 - D. Shadee Rathod  Plan:   1) Upper endoscopy   2) Look at revisional surgery   3) EMMI video for the gastric sleeve resection and gastric bypass   4) Attend an informaiton session on weight loss surgery  3. OSA - on CPAP since around 2015. 4. GERD On Prilosec 40 mg BID. About the same. 5. Hypertension 6. History of bronchitis   Pamela Overall, MD, [facility] Pager: [phone] Office phone:  [phone]

## 2016-06-15 ENCOUNTER — Other Ambulatory Visit: Payer: Self-pay | Admitting: Surgery

## 2016-06-15 ENCOUNTER — Ambulatory Visit (HOSPITAL_COMMUNITY): Payer: BLUE CROSS/BLUE SHIELD | Admitting: Anesthesiology

## 2016-06-15 ENCOUNTER — Encounter (HOSPITAL_COMMUNITY): Payer: Self-pay

## 2016-06-15 ENCOUNTER — Ambulatory Visit (HOSPITAL_COMMUNITY)
Admission: RE | Admit: 2016-06-15 | Discharge: 2016-06-15 | Disposition: A | Discharge status: Home / Self Care | Payer: BLUE CROSS/BLUE SHIELD | Source: Ambulatory Visit | Attending: Surgery | Admitting: Surgery

## 2016-06-15 ENCOUNTER — Encounter (HOSPITAL_COMMUNITY)
Admission: RE | Disposition: A | Discharge status: Home / Self Care | Payer: Self-pay | Source: Ambulatory Visit | Attending: Surgery

## 2016-06-15 DIAGNOSIS — I10 Essential (primary) hypertension: Secondary | ICD-10-CM | POA: Insufficient documentation

## 2016-06-15 DIAGNOSIS — Z9884 Bariatric surgery status: Secondary | ICD-10-CM | POA: Insufficient documentation

## 2016-06-15 DIAGNOSIS — I4891 Unspecified atrial fibrillation: Secondary | ICD-10-CM | POA: Insufficient documentation

## 2016-06-15 DIAGNOSIS — K224 Dyskinesia of esophagus: Secondary | ICD-10-CM | POA: Diagnosis present

## 2016-06-15 DIAGNOSIS — Z6839 Body mass index (BMI) 39.0-39.9, adult: Secondary | ICD-10-CM | POA: Insufficient documentation

## 2016-06-15 DIAGNOSIS — G4733 Obstructive sleep apnea (adult) (pediatric): Secondary | ICD-10-CM | POA: Insufficient documentation

## 2016-06-15 DIAGNOSIS — K219 Gastro-esophageal reflux disease without esophagitis: Secondary | ICD-10-CM | POA: Diagnosis not present

## 2016-06-15 DIAGNOSIS — R634 Abnormal weight loss: Secondary | ICD-10-CM | POA: Insufficient documentation

## 2016-06-15 HISTORY — PX: ESOPHAGOGASTRODUODENOSCOPY: SHX5428

## 2016-06-15 SURGERY — EGD (ESOPHAGOGASTRODUODENOSCOPY)
Anesthesia: Monitor Anesthesia Care

## 2016-06-15 MED ORDER — OXYCODONE HCL 5 MG PO TABS: 5.0000 mg | ORAL_TABLET | Freq: Once | ORAL | Status: DC | PRN

## 2016-06-15 MED ORDER — PROPOFOL 10 MG/ML IV BOLUS
INTRAVENOUS | Status: AC
  Filled 2016-06-15: qty 40

## 2016-06-15 MED ORDER — OXYCODONE HCL 5 MG/5ML PO SOLN: 5.0000 mg | Freq: Once | ORAL | Status: DC | PRN

## 2016-06-15 MED ORDER — LACTATED RINGERS IV SOLN
INTRAVENOUS | Status: DC | PRN
  Administered 2016-06-15: 10:00:00 via INTRAVENOUS

## 2016-06-15 MED ORDER — PROPOFOL 10 MG/ML IV BOLUS
INTRAVENOUS | Status: DC | PRN
  Administered 2016-06-15: 50 mg via INTRAVENOUS
  Administered 2016-06-15 (×2): 20 mg via INTRAVENOUS

## 2016-06-15 MED ORDER — ONDANSETRON HCL 4 MG/2ML IJ SOLN: 4.0000 mg | Freq: Once | INTRAMUSCULAR | Status: DC | PRN

## 2016-06-15 MED ORDER — PROPOFOL 500 MG/50ML IV EMUL
INTRAVENOUS | Status: DC | PRN
  Administered 2016-06-15: 75 ug/kg/min via INTRAVENOUS

## 2016-06-15 MED ORDER — ACETAMINOPHEN 325 MG PO TABS
325.0000 mg | ORAL_TABLET | ORAL | Status: DC | PRN
  Filled 2016-06-15: qty 2

## 2016-06-15 MED ORDER — SODIUM CHLORIDE 0.9 % IV SOLN: INTRAVENOUS | Status: DC

## 2016-06-15 MED ORDER — MEPERIDINE HCL 100 MG/ML IJ SOLN: 6.2500 mg | INTRAMUSCULAR | Status: DC | PRN

## 2016-06-15 MED ORDER — FENTANYL CITRATE (PF) 100 MCG/2ML IJ SOLN: 25.0000 ug | INTRAMUSCULAR | Status: DC | PRN

## 2016-06-15 MED ORDER — ACETAMINOPHEN 160 MG/5ML PO SOLN
325.0000 mg | ORAL | Status: DC | PRN
  Filled 2016-06-15: qty 20.3

## 2016-06-15 NOTE — Op Note (Addendum)
06/15/2016  11:13 AM  PATIENT:  [name], 50 y.o., female, MRN: [id number]  PREOP DIAGNOSIS:  Difficulty swallowing, odynophagia, failure of weight loss, lap band  POSTOP DIAGNOSIS:   Esophageal dilitation, retained food particles in the esophagus, normal band placement. [photos in chart]  PROCEDURE:  Esophagogastroduedonoscopy, Remove an additional 2 cc of fluid from the lap band.  SURGEON:   Alphonsa Overall, M.D.  ANESTHESIA:   Supervised propofol  INDICATIONS FOR PROCEDURE:  [name] is a 50 y.o. (DOB: August 28, 1965)  AA female whose primary care physician is [facility], MD and comes for upper endoscopy to evaluate her lap band.   She has had trouble with chest pain, esophageal spasm, night cough and halitosis.  Note, she is on anticoagulation which we did not stop for this procedure.   The indications and risks of the endoscopy were explained to the patient.  The risks include, but are not limited to, perforation, bleeding, or injury to the bowel.  PROCEDURE:  The patient was monitored by anesthesia who provided propofol sedation.  We did not use cetacaine to spray the throat.   A flexible Pentax endoscope was passed down the throat without difficulty.  Findings include:   Esophagus:   Dilated with food particles   GE junction at:  38 cm.  There was minimal stigmata of esophagitis.   Lap band:  In place.  I was able to pass the scope through the lap band without resistance.   Stomach: Normal   Duodenum:   Normal  PLAN:  At the end of procedure, I removed an additional 2 cc of fluid.  She is getting a knee replacement by Dr. Alain Marion in about 2 weeks.  We'll look into revisional surgery.  Alphonsa Overall, MD, [facility] Pager: [phone] Office phone:  [phone]

## 2016-06-15 NOTE — Anesthesia Preprocedure Evaluation (Signed)
Anesthesia Evaluation  Patient identified by MRN, date of birth, ID band Patient awake    Reviewed: Allergy & Precautions, NPO status , Patient's Chart, lab work & pertinent test results  Airway Mallampati: I       Dental   Pulmonary    Pulmonary exam normal        Cardiovascular hypertension, Pt. on home beta blockers Normal cardiovascular exam+ dysrhythmias Atrial Fibrillation      Neuro/Psych negative neurological ROS  negative psych ROS   GI/Hepatic negative GI ROS, Neg liver ROS,   Endo/Other  Morbid obesity  Renal/GU negative Renal ROS  negative genitourinary   Musculoskeletal negative musculoskeletal ROS (+)   Abdominal (+) + obese,   Peds negative pediatric ROS (+)  Hematology   Anesthesia Other Findings   Reproductive/Obstetrics negative OB ROS                             Anesthesia Physical Anesthesia Plan  ASA: III  Anesthesia Plan: MAC   Post-op Pain Management:    Induction: Intravenous  Airway Management Planned: Nasal Cannula, Natural Airway and Simple Face Mask  Additional Equipment:   Intra-op Plan:   Post-operative Plan:   Informed Consent: I have reviewed the patients History and Physical, chart, labs and discussed the procedure including the risks, benefits and alternatives for the proposed anesthesia with the patient or authorized representative who has indicated his/her understanding and acceptance.     Plan Discussed with: CRNA and Surgeon  Anesthesia Plan Comments:         Anesthesia Quick Evaluation

## 2016-06-15 NOTE — Interval H&P Note (Signed)
History and Physical Interval Note:  06/15/2016 10:23 AM  [name]  has presented today for surgery, with the diagnosis of lap band trouble swallowing  The various methods of treatment have been discussed with the patient and family.   She is still having some trouble.  She is convinced that the fluid is out of the lap band.  Her daughter is going to pick her up.  After consideration of risks, benefits and other options for treatment, the patient has consented to  Procedure(s): ESOPHAGOGASTRODUODENOSCOPY (EGD) (N/A) as a surgical intervention .  The patient's history has been reviewed, patient examined, no change in status, stable for surgery.  I have reviewed the patient's chart and labs.  Questions were answered to the patient's satisfaction.     Hema Lanza H

## 2016-06-15 NOTE — Discharge Instructions (Signed)
Esophagogastroduodenoscopy, Care After Refer to this sheet in the next few weeks. These instructions provide you with information about caring for yourself after your procedure. Your health care provider may also give you more specific instructions. Your treatment has been planned according to current medical practices, but problems sometimes occur. Call your health care provider if you have any problems or questions after your procedure. WHAT TO EXPECT AFTER THE PROCEDURE After your procedure, it is typical to feel:  Soreness in your throat.  Pain with swallowing.  Sick to your stomach (nauseous).  Bloated.  Dizzy.  Fatigued. HOME CARE INSTRUCTIONS  Do not eat or drink anything until the numbing medicine (local anesthetic) has worn off and your gag reflex has returned. You will know that the local anesthetic has worn off when you can swallow comfortably.  Do not drive or operate machinery until directed by your health care provider.  Take medicines only as directed by your health care provider. SEEK MEDICAL CARE IF:   You cannot stop coughing.  You are not urinating at all or less than usual. SEEK IMMEDIATE MEDICAL CARE IF:  You have difficulty swallowing.  You cannot eat or drink.  You have worsening throat or chest pain.  You have dizziness or lightheadedness or you faint.  You have nausea or vomiting.  You have chills.  You have a fever.  You have severe abdominal pain.  You have black, tarry, or bloody stools.   This information is not intended to replace advice given to you by your health care provider. Make sure you discuss any questions you have with your health care provider.   Document Released: 07/13/2012 Document Revised: 08/17/2014 Document Reviewed: 07/13/2012 Elsevier Interactive Patient Education 2016 [place] TO PATIENT  Activity:  Driving - May drive tomorrow  Diet:  As  tolerated  Follow up appointment:  Call Dr. Pollie Friar office [facility]) at [phone] to discuss plans for evaluation for revisional surgery.  Medications and dosages:  Resume your home medications.  Call Dr. Lucia Gaskins or his office  [phone]) if you have:  Persistent nausea and vomiting,  Severe uncontrolled pain,  Difficulty breathing, headache or visual disturbances,  Any other questions or concerns you may have after discharge.  In an emergency, call 911 or go to an Emergency Department at a nearby hospital.

## 2016-06-15 NOTE — Anesthesia Postprocedure Evaluation (Signed)
Anesthesia Post Note  Patient: [name]  Procedure(s) Performed: Procedure(s) (LRB): ESOPHAGOGASTRODUODENOSCOPY (EGD) (N/A)  Patient location during evaluation: Endoscopy Anesthesia Type: MAC Level of consciousness: awake and alert, oriented and patient cooperative Pain management: pain level controlled Vital Signs Assessment: post-procedure vital signs reviewed and stable Respiratory status: spontaneous breathing, nonlabored ventilation and respiratory function stable Cardiovascular status: blood pressure returned to baseline and stable Postop Assessment: no signs of nausea or vomiting Anesthetic complications: no    Last Vitals:  Vitals:   06/15/16 1045 06/15/16 1050  BP: 126/65 128/65  Pulse: 60 (!) 58  Resp: 19 14  Temp: 36.7 C     Last Pain:  Vitals:   06/15/16 1045  TempSrc: Oral                 Avilyn Virtue,E. Nastassia Bazaldua

## 2016-06-15 NOTE — Transfer of Care (Signed)
Immediate Anesthesia Transfer of Care Note  Patient: [name]  Procedure(s) Performed: Procedure(s): ESOPHAGOGASTRODUODENOSCOPY (EGD) (N/A)  Patient Location: PACU  Anesthesia Type:MAC  Level of Consciousness:  sedated, patient cooperative and responds to stimulation  Airway & Oxygen Therapy:Patient Spontanous Breathing and Patient connected to face mask oxgen  Post-op Assessment:  Report given to PACU RN and Post -op Vital signs reviewed and stable  Post vital signs:  Reviewed and stable  Last Vitals:  Vitals:   06/15/16 0813  BP: 139/74  Pulse: 63  Resp: (!) 22  Temp: [id number] C    Complications: No apparent anesthesia complications

## 2016-06-16 ENCOUNTER — Encounter (HOSPITAL_COMMUNITY): Payer: Self-pay | Admitting: Surgery

## 2016-06-17 ENCOUNTER — Encounter (HOSPITAL_BASED_OUTPATIENT_CLINIC_OR_DEPARTMENT_OTHER): Payer: Self-pay | Admitting: *Deleted

## 2016-06-17 NOTE — Progress Notes (Signed)
   06/17/16 1606  OBSTRUCTIVE SLEEP APNEA  Have you ever been diagnosed with sleep apnea through a sleep study? Yes  If yes, do you have and use a CPAP or BPAP machine every night? 1  Do you know the presssure settings on your maching? Yes  Do you snore loudly (loud enough to be heard through closed doors)?  1  Do you often feel tired, fatigued, or sleepy during the daytime (such as falling asleep during driving or talking to someone)? 0  Has anyone observed you stop breathing during your sleep? 1  Do you have, or are you being treated for high blood pressure? 1  BMI more than 35 kg/m2? 1  Age > 55 (1-yes) 0  Female Gender (Yes=1) 0  Obstructive Sleep Apnea Score 4     06/17/16 1606  OBSTRUCTIVE SLEEP APNEA  Have you ever been diagnosed with sleep apnea through a sleep study? Yes  If yes, do you have and use a CPAP or BPAP machine every night? 1  Do you know the presssure settings on your maching? Yes  Do you snore loudly (loud enough to be heard through closed doors)?  1  Do you often feel tired, fatigued, or sleepy during the daytime (such as falling asleep during driving or talking to someone)? 0  Has anyone observed you stop breathing during your sleep? 1  Do you have, or are you being treated for high blood pressure? 1  BMI more than 35 kg/m2? 1  Age > 12 (1-yes) 0  Female Gender (Yes=1) 0  Obstructive Sleep Apnea Score 4

## 2016-06-18 ENCOUNTER — Encounter (HOSPITAL_BASED_OUTPATIENT_CLINIC_OR_DEPARTMENT_OTHER)
Admission: RE | Admit: 2016-06-18 | Discharge: 2016-06-18 | Disposition: A | Discharge status: Home / Self Care | Payer: BLUE CROSS/BLUE SHIELD | Source: Ambulatory Visit | Attending: Orthopedic Surgery | Admitting: Orthopedic Surgery

## 2016-06-18 DIAGNOSIS — Z01818 Encounter for other preprocedural examination: Secondary | ICD-10-CM | POA: Insufficient documentation

## 2016-06-18 DIAGNOSIS — M1711 Unilateral primary osteoarthritis, right knee: Secondary | ICD-10-CM | POA: Diagnosis not present

## 2016-06-18 LAB — BASIC METABOLIC PANEL
ANION GAP: 10 (ref 5–15)
BUN: 11 mg/dL (ref 6–20)
CALCIUM: 9.1 mg/dL (ref 8.9–10.3)
CO2: 25 mmol/L (ref 22–32)
CREATININE: 0.75 mg/dL (ref 0.44–1.00)
Chloride: 102 mmol/L (ref 101–111)
GLUCOSE: 99 mg/dL (ref 65–99)
Potassium: 3.8 mmol/L (ref 3.5–5.1)
Sodium: 137 mmol/L (ref 135–145)

## 2016-06-18 LAB — SURGICAL PCR SCREEN
MRSA, PCR: NEGATIVE
Staphylococcus aureus: NEGATIVE

## 2016-06-26 ENCOUNTER — Encounter (HOSPITAL_BASED_OUTPATIENT_CLINIC_OR_DEPARTMENT_OTHER): Payer: Self-pay | Admitting: *Deleted

## 2016-06-26 ENCOUNTER — Ambulatory Visit (HOSPITAL_BASED_OUTPATIENT_CLINIC_OR_DEPARTMENT_OTHER): Payer: BLUE CROSS/BLUE SHIELD | Admitting: Anesthesiology

## 2016-06-26 ENCOUNTER — Ambulatory Visit (HOSPITAL_COMMUNITY): Payer: BLUE CROSS/BLUE SHIELD

## 2016-06-26 ENCOUNTER — Encounter (HOSPITAL_BASED_OUTPATIENT_CLINIC_OR_DEPARTMENT_OTHER)
Admission: RE | Disposition: A | Discharge status: Home / Self Care | Payer: Self-pay | Source: Ambulatory Visit | Attending: Orthopedic Surgery

## 2016-06-26 ENCOUNTER — Ambulatory Visit (HOSPITAL_BASED_OUTPATIENT_CLINIC_OR_DEPARTMENT_OTHER)
Admission: RE | Admit: 2016-06-26 | Discharge: 2016-06-27 | Disposition: A | Discharge status: Home / Self Care | Payer: BLUE CROSS/BLUE SHIELD | Source: Ambulatory Visit | Attending: Orthopedic Surgery | Admitting: Orthopedic Surgery

## 2016-06-26 DIAGNOSIS — Z79899 Other long term (current) drug therapy: Secondary | ICD-10-CM | POA: Insufficient documentation

## 2016-06-26 DIAGNOSIS — E669 Obesity, unspecified: Secondary | ICD-10-CM | POA: Insufficient documentation

## 2016-06-26 DIAGNOSIS — M1711 Unilateral primary osteoarthritis, right knee: Secondary | ICD-10-CM | POA: Diagnosis not present

## 2016-06-26 DIAGNOSIS — I4891 Unspecified atrial fibrillation: Secondary | ICD-10-CM | POA: Insufficient documentation

## 2016-06-26 DIAGNOSIS — I10 Essential (primary) hypertension: Secondary | ICD-10-CM | POA: Insufficient documentation

## 2016-06-26 DIAGNOSIS — K219 Gastro-esophageal reflux disease without esophagitis: Secondary | ICD-10-CM | POA: Insufficient documentation

## 2016-06-26 DIAGNOSIS — Z86718 Personal history of other venous thrombosis and embolism: Secondary | ICD-10-CM | POA: Insufficient documentation

## 2016-06-26 DIAGNOSIS — Z90711 Acquired absence of uterus with remaining cervical stump: Secondary | ICD-10-CM | POA: Insufficient documentation

## 2016-06-26 DIAGNOSIS — Z9884 Bariatric surgery status: Secondary | ICD-10-CM | POA: Diagnosis not present

## 2016-06-26 HISTORY — PX: PARTIAL KNEE ARTHROPLASTY: SHX2174

## 2016-06-26 HISTORY — DX: Unspecified osteoarthritis, unspecified site: M19.90

## 2016-06-26 HISTORY — DX: Cardiac arrhythmia, unspecified: I49.9

## 2016-06-26 HISTORY — DX: Gastro-esophageal reflux disease without esophagitis: K21.9

## 2016-06-26 SURGERY — ARTHROPLASTY, KNEE, UNICOMPARTMENTAL
Anesthesia: Regional | Site: Knee | Laterality: Right

## 2016-06-26 MED ORDER — OXYCODONE-ACETAMINOPHEN 5-325 MG PO TABS: 1.0000 | ORAL_TABLET | ORAL | 0 refills | Status: DC | PRN

## 2016-06-26 MED ORDER — EPHEDRINE 5 MG/ML INJ
INTRAVENOUS | Status: AC
  Filled 2016-06-26: qty 10

## 2016-06-26 MED ORDER — FENTANYL CITRATE (PF) 100 MCG/2ML IJ SOLN
INTRAMUSCULAR | Status: AC
  Filled 2016-06-26: qty 2

## 2016-06-26 MED ORDER — SCOPOLAMINE 1 MG/3DAYS TD PT72: 1.0000 | MEDICATED_PATCH | Freq: Once | TRANSDERMAL | Status: DC | PRN

## 2016-06-26 MED ORDER — FLECAINIDE ACETATE 50 MG PO TABS
150.0000 mg | ORAL_TABLET | Freq: Two times a day (BID) | ORAL | Status: DC
  Administered 2016-06-26: 150 mg via ORAL

## 2016-06-26 MED ORDER — OXYCODONE HCL 5 MG PO TABS: 5.0000 mg | ORAL_TABLET | Freq: Once | ORAL | Status: DC | PRN

## 2016-06-26 MED ORDER — KETOROLAC TROMETHAMINE 15 MG/ML IJ SOLN
15.0000 mg | Freq: Four times a day (QID) | INTRAMUSCULAR | Status: DC
  Administered 2016-06-26 – 2016-06-27 (×2): 15 mg via INTRAVENOUS
  Filled 2016-06-26 (×2): qty 1

## 2016-06-26 MED ORDER — GLYCOPYRROLATE 0.2 MG/ML IV SOSY
PREFILLED_SYRINGE | INTRAVENOUS | Status: AC
  Filled 2016-06-26: qty 3

## 2016-06-26 MED ORDER — OXYCODONE HCL 5 MG PO TABS
5.0000 mg | ORAL_TABLET | ORAL | Status: DC | PRN
  Administered 2016-06-26: 5 mg via ORAL
  Administered 2016-06-27 (×2): 10 mg via ORAL
  Filled 2016-06-26 (×2): qty 2
  Filled 2016-06-26: qty 1

## 2016-06-26 MED ORDER — HYDROMORPHONE HCL 1 MG/ML IJ SOLN
0.2500 mg | INTRAMUSCULAR | Status: DC | PRN
  Administered 2016-06-26 (×3): 0.5 mg via INTRAVENOUS

## 2016-06-26 MED ORDER — ONDANSETRON HCL 4 MG/2ML IJ SOLN
INTRAMUSCULAR | Status: DC | PRN
  Administered 2016-06-26: 4 mg via INTRAVENOUS

## 2016-06-26 MED ORDER — POLYETHYLENE GLYCOL 3350 17 G PO PACK: 17.0000 g | PACK | Freq: Every day | ORAL | Status: DC | PRN

## 2016-06-26 MED ORDER — ESCITALOPRAM OXALATE 20 MG PO TABS: 20.0000 mg | ORAL_TABLET | Freq: Every day | ORAL | Status: DC

## 2016-06-26 MED ORDER — FENTANYL CITRATE (PF) 100 MCG/2ML IJ SOLN
INTRAMUSCULAR | Status: DC | PRN
  Administered 2016-06-26 (×6): 25 ug via INTRAVENOUS
  Administered 2016-06-26: 100 ug via INTRAVENOUS
  Administered 2016-06-26: 50 ug via INTRAVENOUS

## 2016-06-26 MED ORDER — PHENYLEPHRINE HCL 10 MG/ML IJ SOLN
INTRAMUSCULAR | Status: DC | PRN
  Administered 2016-06-26: 80 ug via INTRAVENOUS

## 2016-06-26 MED ORDER — METHOCARBAMOL 1000 MG/10ML IJ SOLN: 500.0000 mg | Freq: Four times a day (QID) | INTRAVENOUS | Status: DC | PRN

## 2016-06-26 MED ORDER — MIDAZOLAM HCL 5 MG/5ML IJ SOLN
INTRAMUSCULAR | Status: DC | PRN
  Administered 2016-06-26: 2 mg via INTRAVENOUS

## 2016-06-26 MED ORDER — ALBUTEROL SULFATE (2.5 MG/3ML) 0.083% IN NEBU: 2.5000 mg | INHALATION_SOLUTION | RESPIRATORY_TRACT | Status: DC | PRN

## 2016-06-26 MED ORDER — DOCUSATE SODIUM 100 MG PO CAPS: 100.0000 mg | ORAL_CAPSULE | Freq: Two times a day (BID) | ORAL | 0 refills | Status: DC

## 2016-06-26 MED ORDER — METOCLOPRAMIDE HCL 5 MG PO TABS: 5.0000 mg | ORAL_TABLET | Freq: Three times a day (TID) | ORAL | Status: DC | PRN

## 2016-06-26 MED ORDER — LIDOCAINE HCL (CARDIAC) 20 MG/ML IV SOLN
INTRAVENOUS | Status: DC | PRN
  Administered 2016-06-26: 30 mg via INTRAVENOUS

## 2016-06-26 MED ORDER — DILTIAZEM HCL ER COATED BEADS 180 MG PO CP24
180.0000 mg | ORAL_CAPSULE | Freq: Every day | ORAL | Status: DC
  Administered 2016-06-27: 180 mg via ORAL

## 2016-06-26 MED ORDER — OLMESARTAN MEDOXOMIL-HCTZ 40-25 MG PO TABS: 1.0000 | ORAL_TABLET | Freq: Every day | ORAL | Status: DC

## 2016-06-26 MED ORDER — DOCUSATE SODIUM 100 MG PO CAPS
100.0000 mg | ORAL_CAPSULE | Freq: Two times a day (BID) | ORAL | Status: DC
Start: 2016-06-26 — End: 2016-06-27
  Administered 2016-06-26: 100 mg via ORAL
  Filled 2016-06-26: qty 1

## 2016-06-26 MED ORDER — CEFAZOLIN SODIUM-DEXTROSE 2-4 GM/100ML-% IV SOLN
2.0000 g | INTRAVENOUS | Status: AC
  Administered 2016-06-26: 2 g via INTRAVENOUS

## 2016-06-26 MED ORDER — ACETAMINOPHEN 500 MG PO TABS
ORAL_TABLET | ORAL | Status: AC
  Filled 2016-06-26: qty 2

## 2016-06-26 MED ORDER — ACETAMINOPHEN 500 MG PO TABS
1000.0000 mg | ORAL_TABLET | Freq: Once | ORAL | Status: AC
  Administered 2016-06-26: 1000 mg via ORAL

## 2016-06-26 MED ORDER — KETOROLAC TROMETHAMINE 30 MG/ML IJ SOLN
INTRAMUSCULAR | Status: AC
  Filled 2016-06-26: qty 1

## 2016-06-26 MED ORDER — ACETAMINOPHEN 325 MG PO TABS
650.0000 mg | ORAL_TABLET | Freq: Four times a day (QID) | ORAL | Status: DC
  Administered 2016-06-27 (×2): 650 mg via ORAL
  Filled 2016-06-26: qty 2

## 2016-06-26 MED ORDER — PROPOFOL 10 MG/ML IV BOLUS
INTRAVENOUS | Status: DC | PRN
  Administered 2016-06-26: 200 mg via INTRAVENOUS

## 2016-06-26 MED ORDER — METHOCARBAMOL 500 MG PO TABS
500.0000 mg | ORAL_TABLET | Freq: Four times a day (QID) | ORAL | Status: DC | PRN
  Administered 2016-06-27: 500 mg via ORAL
  Filled 2016-06-26: qty 1

## 2016-06-26 MED ORDER — DIPHENHYDRAMINE HCL 12.5 MG/5ML PO ELIX
12.5000 mg | ORAL_SOLUTION | ORAL | Status: DC | PRN
Start: 2016-06-26 — End: 2016-06-27

## 2016-06-26 MED ORDER — PANTOPRAZOLE SODIUM 40 MG PO TBEC: 40.0000 mg | DELAYED_RELEASE_TABLET | Freq: Every day | ORAL | Status: DC

## 2016-06-26 MED ORDER — CEFAZOLIN SODIUM-DEXTROSE 2-4 GM/100ML-% IV SOLN
INTRAVENOUS | Status: AC
  Filled 2016-06-26: qty 100

## 2016-06-26 MED ORDER — HYDROMORPHONE HCL 1 MG/ML IJ SOLN
INTRAMUSCULAR | Status: AC
  Filled 2016-06-26: qty 1

## 2016-06-26 MED ORDER — OXYCODONE HCL 5 MG/5ML PO SOLN: 5.0000 mg | Freq: Once | ORAL | Status: DC | PRN

## 2016-06-26 MED ORDER — MIDAZOLAM HCL 2 MG/2ML IJ SOLN
INTRAMUSCULAR | Status: AC
  Filled 2016-06-26: qty 2

## 2016-06-26 MED ORDER — PHENOL 1.4 % MT LIQD: 1.0000 | OROMUCOSAL | Status: DC | PRN

## 2016-06-26 MED ORDER — DEXAMETHASONE SODIUM PHOSPHATE 10 MG/ML IJ SOLN
10.0000 mg | Freq: Once | INTRAMUSCULAR | Status: AC
  Administered 2016-06-27: 10 mg via INTRAVENOUS
  Filled 2016-06-26: qty 1

## 2016-06-26 MED ORDER — LACTATED RINGERS IV SOLN
INTRAVENOUS | Status: DC
  Administered 2016-06-26 (×2): via INTRAVENOUS

## 2016-06-26 MED ORDER — MORPHINE SULFATE (PF) 2 MG/ML IV SOLN: 2.0000 mg | INTRAVENOUS | Status: DC | PRN

## 2016-06-26 MED ORDER — ACETAMINOPHEN 650 MG RE SUPP: 650.0000 mg | Freq: Four times a day (QID) | RECTAL | Status: DC | PRN

## 2016-06-26 MED ORDER — LACTATED RINGERS IV SOLN: INTRAVENOUS | Status: DC

## 2016-06-26 MED ORDER — GABAPENTIN 300 MG PO CAPS
ORAL_CAPSULE | ORAL | Status: AC
  Filled 2016-06-26: qty 1

## 2016-06-26 MED ORDER — EDOXABAN TOSYLATE 60 MG PO TABS
60.0000 mg | ORAL_TABLET | Freq: Every day | ORAL | Status: DC
  Administered 2016-06-27: 60 mg via ORAL

## 2016-06-26 MED ORDER — CHLORHEXIDINE GLUCONATE 4 % EX LIQD: 60.0000 mL | Freq: Once | CUTANEOUS | Status: DC

## 2016-06-26 MED ORDER — SENNA 8.6 MG PO TABS
1.0000 | ORAL_TABLET | Freq: Two times a day (BID) | ORAL | Status: DC
  Administered 2016-06-26: 8.6 mg via ORAL
  Filled 2016-06-26: qty 1

## 2016-06-26 MED ORDER — ONDANSETRON HCL 4 MG/2ML IJ SOLN: 4.0000 mg | Freq: Once | INTRAMUSCULAR | Status: DC | PRN

## 2016-06-26 MED ORDER — BUPIVACAINE HCL (PF) 0.25 % IJ SOLN
INTRAMUSCULAR | Status: DC | PRN
  Administered 2016-06-26: 30 mL

## 2016-06-26 MED ORDER — MENTHOL 3 MG MT LOZG
1.0000 | LOZENGE | OROMUCOSAL | Status: DC | PRN
Start: 2016-06-26 — End: 2016-06-27

## 2016-06-26 MED ORDER — MIDAZOLAM HCL 2 MG/2ML IJ SOLN
1.0000 mg | INTRAMUSCULAR | Status: DC | PRN
  Administered 2016-06-26: 2 mg via INTRAVENOUS

## 2016-06-26 MED ORDER — GABAPENTIN 300 MG PO CAPS
300.0000 mg | ORAL_CAPSULE | Freq: Once | ORAL | Status: AC
  Administered 2016-06-26: 300 mg via ORAL

## 2016-06-26 MED ORDER — LACTATED RINGERS IV SOLN
INTRAVENOUS | Status: DC
  Administered 2016-06-26: 18:00:00 via INTRAVENOUS

## 2016-06-26 MED ORDER — METOPROLOL SUCCINATE ER 50 MG PO TB24: 50.0000 mg | ORAL_TABLET | Freq: Every day | ORAL | Status: DC

## 2016-06-26 MED ORDER — FENTANYL CITRATE (PF) 100 MCG/2ML IJ SOLN
50.0000 ug | INTRAMUSCULAR | Status: DC | PRN
  Administered 2016-06-26: 100 ug via INTRAVENOUS

## 2016-06-26 MED ORDER — SORBITOL 70 % SOLN: 30.0000 mL | Freq: Every day | Status: DC | PRN

## 2016-06-26 MED ORDER — GLYCOPYRROLATE 0.2 MG/ML IJ SOLN
INTRAMUSCULAR | Status: DC | PRN
  Administered 2016-06-26 (×2): 0.2 mg via INTRAVENOUS

## 2016-06-26 MED ORDER — SODIUM CHLORIDE 0.9 % IR SOLN
Status: DC | PRN
  Administered 2016-06-26: 3000 mL

## 2016-06-26 MED ORDER — SODIUM CHLORIDE 0.9 % IJ SOLN
INTRAMUSCULAR | Status: DC | PRN
Start: 2016-06-26 — End: 2016-06-26
  Administered 2016-06-26: 30 mL via INTRAVENOUS

## 2016-06-26 MED ORDER — PHENYLEPHRINE 40 MCG/ML (10ML) SYRINGE FOR IV PUSH (FOR BLOOD PRESSURE SUPPORT)
PREFILLED_SYRINGE | INTRAVENOUS | Status: AC
  Filled 2016-06-26: qty 10

## 2016-06-26 MED ORDER — CEFAZOLIN SODIUM-DEXTROSE 2-4 GM/100ML-% IV SOLN
2.0000 g | Freq: Four times a day (QID) | INTRAVENOUS | Status: AC
  Administered 2016-06-26 – 2016-06-27 (×2): 2 g via INTRAVENOUS
  Filled 2016-06-26: qty 100

## 2016-06-26 MED ORDER — METOCLOPRAMIDE HCL 5 MG/ML IJ SOLN
5.0000 mg | Freq: Three times a day (TID) | INTRAMUSCULAR | Status: DC | PRN
Start: 2016-06-26 — End: 2016-06-27

## 2016-06-26 MED ORDER — METHOCARBAMOL 500 MG PO TABS: 500.0000 mg | ORAL_TABLET | Freq: Four times a day (QID) | ORAL | 0 refills | Status: DC | PRN

## 2016-06-26 MED ORDER — DEXAMETHASONE SODIUM PHOSPHATE 10 MG/ML IJ SOLN
INTRAMUSCULAR | Status: DC | PRN
  Administered 2016-06-26: 10 mg via INTRAVENOUS

## 2016-06-26 MED ORDER — KETOROLAC TROMETHAMINE 30 MG/ML IJ SOLN
INTRAMUSCULAR | Status: DC | PRN
  Administered 2016-06-26: 30 mg via INTRAVENOUS

## 2016-06-26 MED ORDER — ACETAMINOPHEN 325 MG PO TABS: 650.0000 mg | ORAL_TABLET | Freq: Four times a day (QID) | ORAL | Status: DC | PRN

## 2016-06-26 SURGICAL SUPPLY — 74 items
BANDAGE ACE 4X5 VEL STRL LF (GAUZE/BANDAGES/DRESSINGS) ×3 IMPLANT
BANDAGE ACE 6X5 VEL STRL LF (GAUZE/BANDAGES/DRESSINGS) ×3 IMPLANT
BANDAGE ESMARK 6X9 LF (GAUZE/BANDAGES/DRESSINGS) ×1 IMPLANT
BENZOIN TINCTURE PRP APPL 2/3 (GAUZE/BANDAGES/DRESSINGS) ×3 IMPLANT
BLADE SURG 10 STRL SS (BLADE) ×3 IMPLANT
BLADE SURG 15 STRL LF DISP TIS (BLADE) ×2 IMPLANT
BLADE SURG 15 STRL SS (BLADE) ×4
BNDG ESMARK 6X9 LF (GAUZE/BANDAGES/DRESSINGS) ×3
BONE CEMENT PALACOSE (Orthopedic Implant) ×3 IMPLANT
BOWL SMART MIX CTS (DISPOSABLE) ×3 IMPLANT
CANISTER SUCT 1200ML W/VALVE (MISCELLANEOUS) ×3 IMPLANT
CAPT KNEE PARTIAL 2 ×3 IMPLANT
CEMENT BONE PALACOSE (Orthopedic Implant) ×1 IMPLANT
CHLORAPREP W/TINT 26ML (MISCELLANEOUS) ×3 IMPLANT
CLOSURE STERI-STRIP 1/2X4 (GAUZE/BANDAGES/DRESSINGS) ×1
CLSR STERI-STRIP ANTIMIC 1/2X4 (GAUZE/BANDAGES/DRESSINGS) ×2 IMPLANT
COVER BACK TABLE 60X90IN (DRAPES) ×3 IMPLANT
CUFF TOURNIQUET SINGLE 34IN LL (TOURNIQUET CUFF) IMPLANT
CUFF TOURNIQUET SINGLE 44IN (TOURNIQUET CUFF) ×3 IMPLANT
DERMABOND ADVANCED (GAUZE/BANDAGES/DRESSINGS) ×2
DERMABOND ADVANCED .7 DNX12 (GAUZE/BANDAGES/DRESSINGS) ×1 IMPLANT
DRAPE EXTREMITY T 121X128X90 (DRAPE) ×3 IMPLANT
DRAPE IMP U-DRAPE 54X76 (DRAPES) ×3 IMPLANT
DRAPE INCISE IOBAN 66X45 STRL (DRAPES) ×3 IMPLANT
DRAPE U-SHAPE 47X51 STRL (DRAPES) ×3 IMPLANT
DRSG MEPILEX BORDER 4X8 (GAUZE/BANDAGES/DRESSINGS) ×3 IMPLANT
ELECT REM PT RETURN 9FT ADLT (ELECTROSURGICAL) ×3
ELECTRODE REM PT RTRN 9FT ADLT (ELECTROSURGICAL) ×1 IMPLANT
GAUZE SPONGE 4X4 12PLY STRL (GAUZE/BANDAGES/DRESSINGS) ×3 IMPLANT
GLOVE BIO SURGEON STRL SZ 6.5 (GLOVE) ×2 IMPLANT
GLOVE BIO SURGEON STRL SZ7.5 (GLOVE) ×6 IMPLANT
GLOVE BIO SURGEONS STRL SZ 6.5 (GLOVE) ×1
GLOVE BIOGEL PI IND STRL 7.0 (GLOVE) ×1 IMPLANT
GLOVE BIOGEL PI IND STRL 8 (GLOVE) ×2 IMPLANT
GLOVE BIOGEL PI INDICATOR 7.0 (GLOVE) ×2
GLOVE BIOGEL PI INDICATOR 8 (GLOVE) ×4
GOWN STRL REUS W/ TWL LRG LVL3 (GOWN DISPOSABLE) ×5 IMPLANT
GOWN STRL REUS W/TWL LRG LVL3 (GOWN DISPOSABLE) ×10
HANDPIECE INTERPULSE COAX TIP (DISPOSABLE) ×2
IMMOBILIZER KNEE 22 UNIV (SOFTGOODS) IMPLANT
IMMOBILIZER KNEE 24 THIGH 36 (MISCELLANEOUS) ×1 IMPLANT
IMMOBILIZER KNEE 24 UNIV (MISCELLANEOUS) ×3
KNEE WRAP E Z 3 GEL PACK (MISCELLANEOUS) ×3 IMPLANT
MANIFOLD NEPTUNE II (INSTRUMENTS) ×3 IMPLANT
NDL SAFETY ECLIPSE 18X1.5 (NEEDLE) ×1 IMPLANT
NEEDLE HYPO 18GX1.5 SHARP (NEEDLE) ×2
NS IRRIG 1000ML POUR BTL (IV SOLUTION) ×3 IMPLANT
PACK ARTHROSCOPY DSU (CUSTOM PROCEDURE TRAY) ×3 IMPLANT
PACK BASIN DAY SURGERY FS (CUSTOM PROCEDURE TRAY) ×3 IMPLANT
PACK BLADE SAW RECIP 70 3 PT (BLADE) ×3 IMPLANT
PADDING CAST COTTON 6X4 STRL (CAST SUPPLIES) ×3 IMPLANT
PENCIL BUTTON HOLSTER BLD 10FT (ELECTRODE) ×3 IMPLANT
PILLOW KNEE EXTENSION 0 DEG (MISCELLANEOUS) ×3 IMPLANT
SET HNDPC FAN SPRY TIP SCT (DISPOSABLE) ×1 IMPLANT
SHEET MEDIUM DRAPE 40X70 STRL (DRAPES) ×3 IMPLANT
SLEEVE SCD COMPRESS KNEE MED (MISCELLANEOUS) ×3 IMPLANT
SPONGE LAP 18X18 X RAY DECT (DISPOSABLE) ×3 IMPLANT
SUCTION FRAZIER HANDLE 10FR (MISCELLANEOUS) ×2
SUCTION TUBE FRAZIER 10FR DISP (MISCELLANEOUS) ×1 IMPLANT
SUT MNCRL AB 4-0 PS2 18 (SUTURE) ×3 IMPLANT
SUT MON AB 2-0 CT1 36 (SUTURE) ×3 IMPLANT
SUT VIC AB 0 CT1 27 (SUTURE) ×2
SUT VIC AB 0 CT1 27XBRD ANBCTR (SUTURE) ×1 IMPLANT
SUT VIC AB 1 CT1 27 (SUTURE) ×4
SUT VIC AB 1 CT1 27XBRD ANBCTR (SUTURE) ×2 IMPLANT
SUT VIC AB 2-0 SH 27 (SUTURE)
SUT VIC AB 2-0 SH 27XBRD (SUTURE) IMPLANT
SYR 50ML LL SCALE MARK (SYRINGE) ×3 IMPLANT
SYR BULB IRRIGATION 50ML (SYRINGE) ×3 IMPLANT
TOWEL OR 17X24 6PK STRL BLUE (TOWEL DISPOSABLE) ×3 IMPLANT
TOWEL OR NON WOVEN STRL DISP B (DISPOSABLE) ×9 IMPLANT
TUBE CONNECTING 20'X1/4 (TUBING) ×1
TUBE CONNECTING 20X1/4 (TUBING) ×2 IMPLANT
YANKAUER SUCT BULB TIP NO VENT (SUCTIONS) ×3 IMPLANT

## 2016-06-26 NOTE — Interval H&P Note (Signed)
History and Physical Interval Note:  06/26/2016 12:43 PM  [name]  has presented today for surgery, with the diagnosis of RIGHT KNEE OSTEOARTHRITS  The various methods of treatment have been discussed with the patient and family. After consideration of risks, benefits and other options for treatment, the patient has consented to  Procedure(s): RIGHT UNICOMPARTMENTAL [place] (Right) as a surgical intervention .  The patient's history has been reviewed, patient examined, no change in status, stable for surgery.  I have reviewed the patient's chart and labs.  Questions were answered to the patient's satisfaction.     [name]

## 2016-06-26 NOTE — Progress Notes (Signed)
AssistedDr. Massagee with right, ultrasound guided, adductor canal block. Side rails up, monitors on throughout procedure. See vital signs in flow sheet. Tolerated Procedure well.  

## 2016-06-26 NOTE — Anesthesia Preprocedure Evaluation (Addendum)
Anesthesia Evaluation  Patient identified by MRN, date of birth, ID band Patient awake    Reviewed: Allergy & Precautions, NPO status , Patient's Chart, lab work & pertinent test results  Airway Mallampati: I   Neck ROM: Full    Dental  (+) Teeth Intact   Pulmonary    breath sounds clear to auscultation       Cardiovascular hypertension, + dysrhythmias Atrial Fibrillation  Rhythm:regular Rate:Normal     Neuro/Psych    GI/Hepatic Neg liver ROS, GERD  ,  Endo/Other  Morbid obesity  Renal/GU negative Renal ROS     Musculoskeletal  (+) Arthritis ,   Abdominal (+) + obese,   Peds  Hematology   Anesthesia Other Findings   Reproductive/Obstetrics                           Anesthesia Physical Anesthesia Plan  ASA: III  Anesthesia Plan: General and Regional   Post-op Pain Management:  Regional for Post-op pain   Induction: Intravenous  Airway Management Planned: LMA  Additional Equipment:   Intra-op Plan:   Post-operative Plan:   Informed Consent: I have reviewed the patients History and Physical, chart, labs and discussed the procedure including the risks, benefits and alternatives for the proposed anesthesia with the patient or authorized representative who has indicated his/her understanding and acceptance.     Plan Discussed with: CRNA, Anesthesiologist and Surgeon  Anesthesia Plan Comments:         Anesthesia Quick Evaluation

## 2016-06-26 NOTE — Anesthesia Procedure Notes (Addendum)
Anesthesia Regional Block:  Adductor canal block  Pre-Anesthetic Checklist: ,, timeout performed, Correct Patient, Correct Site, Correct Laterality, Correct Procedure, Correct Position, site marked, Risks and benefits discussed,  Surgical consent,  Pre-op evaluation,  At surgeon's request and post-op pain management  Laterality: Right and Lower  Prep: chloraprep       Needles:   Needle Type: Echogenic Stimulator Needle     Needle Length: 9cm 9 cm Needle Gauge: 21 and 21 G  Needle insertion depth: 6 cm   Additional Needles: Adductor canal block Narrative:  Start time: 06/26/2016 1:20 PM End time: 06/26/2016 1:39 PM Injection made incrementally with aspirations every 5 mL.  Performed by: Personally  Anesthesiologist: Wolf Boulay

## 2016-06-26 NOTE — Transfer of Care (Signed)
Immediate Anesthesia Transfer of Care Note  Patient: [name]  Procedure(s) Performed: Procedure(s): RIGHT UNICOMPARTMENTAL KNEE CODYLE AND PLATEAU MEDIAL COMPARTMENT (Right)  Patient Location: PACU  Anesthesia Type:GA combined with regional for post-op pain  Level of Consciousness: sedated  Airway & Oxygen Therapy: Patient Spontanous Breathing and Patient connected to face mask oxygen  Post-op Assessment: Report given to RN and Post -op Vital signs reviewed and stable  Post vital signs: Reviewed and stable  Last Vitals:  Vitals:   06/26/16 1649 06/26/16 1650  BP: 134/71   Pulse: 63 62  Resp:  17  Temp:      Last Pain:  Vitals:   06/26/16 1142  TempSrc: Oral         Complications: No apparent anesthesia complications

## 2016-06-26 NOTE — Anesthesia Postprocedure Evaluation (Signed)
Anesthesia Post Note  Patient: [name]  Procedure(s) Performed: Procedure(s) (LRB): RIGHT UNICOMPARTMENTAL KNEE CODYLE AND PLATEAU MEDIAL COMPARTMENT (Right)  Patient location during evaluation: PACU Anesthesia Type: General and Regional Level of consciousness: awake, awake and alert and oriented Pain management: pain level controlled Vital Signs Assessment: post-procedure vital signs reviewed and stable Respiratory status: spontaneous breathing, nonlabored ventilation and respiratory function stable Cardiovascular status: blood pressure returned to baseline Anesthetic complications: no    Last Vitals:  Vitals:   06/26/16 1700 06/26/16 1715  BP: (!) 140/92 (!) 138/92  Pulse: 68 65  Resp: 15 13  Temp:      Last Pain:  Vitals:   06/26/16 1715  TempSrc:   PainSc: 8         RLE Motor Response: Purposeful movement (06/26/16 1715) RLE Sensation: Full sensation (06/26/16 1715)      Charla Criscione COKER

## 2016-06-26 NOTE — Discharge Instructions (Signed)
INSTRUCTIONS AFTER JOINT REPLACEMENT   o Remove items at home which could result in a fall. This includes throw rugs or furniture in walking pathways o ICE to the affected joint every three hours while awake for 30 minutes at a time, for at least the first 3-5 days, and then as needed for pain and swelling.  Continue to use ice for pain and swelling. You may notice swelling that will progress down to the foot and ankle.  This is normal after surgery.  Elevate your leg when you are not up walking on it.   o Continue to use the breathing machine you got in the hospital (incentive spirometer) which will help keep your temperature down.  It is common for your temperature to cycle up and down following surgery, especially at night when you are not up moving around and exerting yourself.  The breathing machine keeps your lungs expanded and your temperature down.   DIET:  As you were doing prior to hospitalization, we recommend a well-balanced diet.  DRESSING / WOUND CARE / SHOWERING  Keep the surgical dressing until follow up.  IF THE DRESSING FALLS OFF or the wound gets wet inside, change the dressing with sterile gauze.  Please use good hand washing techniques before changing the dressing.  Do not use any lotions or creams on the incision until instructed by your surgeon.    You may loosen and re-apply ace wrap if it feels too tight  ACTIVITY  o Increase activity slowly as tolerated, but follow the weight bearing instructions below.   o No driving for 6 weeks or until further direction given by your physician.  You cannot drive while taking narcotics.  o No lifting or carrying greater than 10 lbs. until further directed by your surgeon. o Avoid periods of inactivity such as sitting longer than an hour when not asleep. This helps prevent blood clots.  o You may return to work once you are authorized by your doctor.     WEIGHT BEARING   Weight bearing as tolerated with assist device (walker,  cane, etc) as directed, use it as long as suggested by your surgeon or therapist, typically at least 4-6 weeks.   EXERCISES  Results after joint replacement surgery are often greatly improved when you follow the exercise, range of motion and muscle strengthening exercises prescribed by your doctor. Safety measures are also important to protect the joint from further injury. Any time any of these exercises cause you to have increased pain or swelling, decrease what you are doing until you are comfortable again and then slowly increase them. If you have problems or questions, call your caregiver or physical therapist for advice.   Rehabilitation is important following a joint replacement. After just a few days of immobilization, the muscles of the leg can become weakened and shrink (atrophy).  These exercises are designed to build up the tone and strength of the thigh and leg muscles and to improve motion. Often times heat used for twenty to thirty minutes before working out will loosen up your tissues and help with improving the range of motion but do not use heat for the first two weeks following surgery (sometimes heat can increase post-operative swelling).   These exercises can be done on a training (exercise) mat, on the floor, on a table or on a bed. Use whatever works the best and is most comfortable for you.    Use music or television while you are exercising so that the  exercises are a pleasant break in your day. This will make your life better with the exercises acting as a break in your routine that you can look forward to.   Perform all exercises about fifteen times, three times per day or as directed.  You should exercise both the operative leg and the other leg as well.  Exercises include:    Quad Sets - Tighten up the muscle on the front of the thigh (Quad) and hold for 5-10 seconds.    Straight Leg Raises - With your knee straight (if you were given a brace, keep it on), lift the leg to  60 degrees, hold for 3 seconds, and slowly lower the leg.  Perform this exercise against resistance later as your leg gets stronger.   Leg Slides: Lying on your back, slowly slide your foot toward your buttocks, bending your knee up off the floor (only go as far as is comfortable). Then slowly slide your foot back down until your leg is flat on the floor again.   Angel Wings: Lying on your back spread your legs to the side as far apart as you can without causing discomfort.   Hamstring Strength:  Lying on your back, push your heel against the floor with your leg straight by tightening up the muscles of your buttocks.  Repeat, but this time bend your knee to a comfortable angle, and push your heel against the floor.  You may put a pillow under the heel to make it more comfortable if necessary.   A rehabilitation program following joint replacement surgery can speed recovery and prevent re-injury in the future due to weakened muscles. Contact your doctor or a physical therapist for more information on knee rehabilitation.    CONSTIPATION  Constipation is defined medically as fewer than three stools per week and severe constipation as less than one stool per week.  Even if you have a regular bowel pattern at home, your normal regimen is likely to be disrupted due to multiple reasons following surgery.  Combination of anesthesia, postoperative narcotics, change in appetite and fluid intake all can affect your bowels.   YOU MUST use at least one of the following options; they are listed in order of increasing strength to get the job done.  They are all available over the counter, and you may need to use some, POSSIBLY even all of these options:    Drink plenty of fluids (prune juice may be helpful) and high fiber foods Colace 100 mg by mouth twice a day  Senokot for constipation as directed and as needed Dulcolax (bisacodyl), take with full glass of water  Miralax (polyethylene glycol) once or twice a  day as needed.  If you have tried all these things and are unable to have a bowel movement in the first 3-4 days after surgery call either your surgeon or your primary doctor.    If you experience loose stools or diarrhea, hold the medications until you stool forms back up.  If your symptoms do not get better within 1 week or if they get worse, check with your doctor.  If you experience "the worst abdominal pain ever" or develop nausea or vomiting, please contact the office immediately for further recommendations for treatment.   ITCHING:  If you experience itching with your medications, try taking only a single pain pill, or even half a pain pill at a time.  You can also use Benadryl over the counter for itching or also to help  with sleep.   TED HOSE STOCKINGS:  Use stockings on both legs until for at least 2 weeks or as directed by physician office. They may be removed at night for sleeping.  MEDICATIONS:  See your medication summary on the After Visit Summary that nursing will review with you.  You may have some home medications which will be placed on hold until you complete the course of blood thinner medication.  It is important for you to complete the blood thinner medication as prescribed.  PRECAUTIONS:  If you experience chest pain or shortness of breath - call 911 immediately for transfer to the hospital emergency department.   If you develop a fever greater that 101 F, purulent drainage from wound, increased redness or drainage from wound, foul odor from the wound/dressing, or calf pain - CONTACT YOUR SURGEON.                                                   FOLLOW-UP APPOINTMENTS:  If you do not already have a post-op appointment, please call the office for an appointment to be seen by your surgeon.  Guidelines for how soon to be seen are listed in your After Visit Summary, but are typically between 1-4 weeks after surgery.  OTHER INSTRUCTIONS:   Knee Replacement:  Do not place  pillow under knee, focus on keeping the knee straight while resting. CPM instructions: 0-90 degrees, 2 hours in the morning, 2 hours in the afternoon, and 2 hours in the evening. Place foam block, curve side up under heel at all times except when in CPM or when walking.  DO NOT modify, tear, cut, or change the foam block in any way.  MAKE SURE YOU:   Understand these instructions.   Get help right away if you are not doing well or get worse.    Thank you for letting us be a part of your medical care team.  It is a privilege we respect greatly.  We hope these instructions will help you stay on track for a fast and full recovery!

## 2016-06-26 NOTE — Anesthesia Procedure Notes (Signed)
Procedure Name: LMA Insertion Date/Time: 06/26/2016 2:25 PM Performed by: Toula Moos L Pre-anesthesia Checklist: Patient identified, Emergency Drugs available, Suction available, Patient being monitored and Timeout performed Patient Re-evaluated:Patient Re-evaluated prior to inductionOxygen Delivery Method: Circle system utilized Preoxygenation: Pre-oxygenation with 100% oxygen Intubation Type: IV induction Ventilation: Mask ventilation without difficulty LMA: LMA inserted LMA Size: 4.0 Number of attempts: 1 Airway Equipment and Method: Bite block Placement Confirmation: positive ETCO2 Tube secured with: Tape Dental Injury: Teeth and Oropharynx as per pre-operative assessment

## 2016-06-26 NOTE — Op Note (Signed)
06/26/2016  3:53 PM  PATIENT:  [name]    PRE-OPERATIVE DIAGNOSIS:  RIGHT KNEE OSTEOARTHRITS  POST-OPERATIVE DIAGNOSIS:  Same  PROCEDURE:  RIGHT UNICOMPARTMENTAL KNEE CODYLE AND PLATEAU MEDIAL COMPARTMENT  SURGEON:  Hance Caspers, Ernesta Amble, MD  PHYSICIAN ASSISTANT: Roxan Hockey, PA-C, he was present and scrubbed throughout the case, critical for completion in a timely fashion, and for retraction, instrumentation, and closure.   ANESTHESIA:   General  PREOPERATIVE INDICATIONS:  [name] is a  50 y.o. female with a diagnosis of [place] who failed conservative measures and elected for surgical management.    The risks benefits and alternatives were discussed with the patient preoperatively including but not limited to the risks of infection, bleeding, nerve injury, cardiopulmonary complications, blood clots, the need for revision surgery, among others, and the patient was willing to proceed.  OPERATIVE IMPLANTS: Biomet [place] mobile bearing medial compartment arthroplasty. Femoral Component: medium. Tibial tray: D, Size 4 poly.   OPERATIVE FINDINGS: Endstage grade 4 medial compartment osteoarthritis. No significant changes in the lateral or patellofemoral joint  OPERATIVE PROCEDURE: The patient was brought to the operating room placed in supine position. General anesthesia was administered. IV antibiotics were given. The lower extremity was placed in the legholder and prepped and draped in usual sterile fashion.  Time out was performed.  The leg was elevated and exsanguinated and the tourniquet was inflated. Anteromedial incision was performed, and I took care to preserve the MCL. Parapatellar incision was carried out, and the osteophytes were excised, along with the medial meniscus and a small portion of the fat pad.  The extra medullary tibial cutting jig was applied, using the spoon and the 41mm G-Clamp, and I took care to protect the anterior cruciate  ligament insertion and the tibial spine. The medial collateral ligament was also protected, and I resected my proximal tibia, matching the anatomic slope.   The proximal tibial bony cut was removed in one piece, and I turned my attention to the femur.  The intramedullary femoral rod was placed using the drill, and then using the appropriate reference, I assembled the femoral jig, setting my posterior cutting block. I resected my posterior femur, and then measured my gap.   I then used the mill to match the extension gap to the flexion gap. The gaps were then measured again with the appropriate feeler gauges. Once I had balanced flexion and extension gaps, I then completed the preparation of the femur.  I milled off the anterior aspect of the distal femur to prevent impingement. I also exposed the tibia, and selected the above-named component, and then used the cutting jig to prepare the keel slot on the tibia. I also used the awl to curette out the bone to complete the preparation of the keel. The back wall was intact.  I then placed trial components, and it was found to have excellent motion, and appropriate balance.  I then cemented the components into place, cementing the tibia first, removing all excess cement, and then cementing the femur.  All loose cement was removed.  The real polyethylene insert was applied manually, and the knee was taken through functional range of motion, and found to have excellent stability and restoration of joint motion, with excellent balance.  The wounds were irrigated copiously, and the parapatellar tissue closed with Vicryl, followed by Vicryl for the subcutaneous tissue, with routine closure with Steri-Strips and sterile gauze.  The tourniquet was released, and the patient was awakened and  extubated and returned to PACU in stable and satisfactory condition. There were no complications.  POSTOPERATIVE PLAN: DVT px will consist of SCD's and chemical px. ,  WBAT     Renette Butters, MD

## 2016-06-27 DIAGNOSIS — M1711 Unilateral primary osteoarthritis, right knee: Secondary | ICD-10-CM | POA: Diagnosis not present

## 2016-06-27 MED ORDER — ACETAMINOPHEN 325 MG PO TABS
ORAL_TABLET | ORAL | Status: AC
  Filled 2016-06-27: qty 2

## 2016-06-27 MED ORDER — TAMSULOSIN HCL 0.4 MG PO CAPS
0.4000 mg | ORAL_CAPSULE | Freq: Every day | ORAL | Status: DC
  Administered 2016-06-27: 0.4 mg via ORAL
  Filled 2016-06-27: qty 1

## 2016-06-29 ENCOUNTER — Encounter (HOSPITAL_BASED_OUTPATIENT_CLINIC_OR_DEPARTMENT_OTHER): Payer: Self-pay | Admitting: Orthopedic Surgery

## 2016-07-02 ENCOUNTER — Encounter (HOSPITAL_COMMUNITY): Payer: Self-pay | Admitting: Emergency Medicine

## 2016-07-02 ENCOUNTER — Emergency Department (HOSPITAL_COMMUNITY)
Admission: EM | Admit: 2016-07-02 | Discharge: 2016-07-02 | Disposition: A | Discharge status: Home / Self Care | Payer: BLUE CROSS/BLUE SHIELD | Attending: Emergency Medicine | Admitting: Emergency Medicine

## 2016-07-02 ENCOUNTER — Emergency Department (HOSPITAL_BASED_OUTPATIENT_CLINIC_OR_DEPARTMENT_OTHER)
Admit: 2016-07-02 | Discharge: 2016-07-02 | Disposition: A | Discharge status: Home / Self Care | Payer: BLUE CROSS/BLUE SHIELD | Attending: Emergency Medicine | Admitting: Emergency Medicine

## 2016-07-02 DIAGNOSIS — G8918 Other acute postprocedural pain: Secondary | ICD-10-CM | POA: Diagnosis not present

## 2016-07-02 DIAGNOSIS — Y939 Activity, unspecified: Secondary | ICD-10-CM | POA: Diagnosis not present

## 2016-07-02 DIAGNOSIS — Y999 Unspecified external cause status: Secondary | ICD-10-CM | POA: Insufficient documentation

## 2016-07-02 DIAGNOSIS — Y929 Unspecified place or not applicable: Secondary | ICD-10-CM | POA: Diagnosis not present

## 2016-07-02 DIAGNOSIS — Z96651 Presence of right artificial knee joint: Secondary | ICD-10-CM | POA: Diagnosis not present

## 2016-07-02 DIAGNOSIS — S8011XA Contusion of right lower leg, initial encounter: Secondary | ICD-10-CM | POA: Insufficient documentation

## 2016-07-02 DIAGNOSIS — I10 Essential (primary) hypertension: Secondary | ICD-10-CM | POA: Insufficient documentation

## 2016-07-02 DIAGNOSIS — S8991XA Unspecified injury of right lower leg, initial encounter: Secondary | ICD-10-CM | POA: Diagnosis present

## 2016-07-02 DIAGNOSIS — M7989 Other specified soft tissue disorders: Secondary | ICD-10-CM | POA: Diagnosis not present

## 2016-07-02 DIAGNOSIS — X58XXXA Exposure to other specified factors, initial encounter: Secondary | ICD-10-CM | POA: Insufficient documentation

## 2016-07-02 LAB — CBC
HCT: 32.1 % — ABNORMAL LOW (ref 36.0–46.0)
Hemoglobin: 10.4 g/dL — ABNORMAL LOW (ref 12.0–15.0)
MCH: 26.9 pg (ref 26.0–34.0)
MCHC: 32.4 g/dL (ref 30.0–36.0)
MCV: 82.9 fL (ref 78.0–100.0)
PLATELETS: 343 10*3/uL (ref 150–400)
RBC: 3.87 MIL/uL (ref 3.87–5.11)
RDW: 13.7 % (ref 11.5–15.5)
WBC: 7.2 10*3/uL (ref 4.0–10.5)

## 2016-07-02 MED ORDER — OXYCODONE-ACETAMINOPHEN 5-325 MG PO TABS
1.0000 | ORAL_TABLET | Freq: Once | ORAL | Status: AC
  Administered 2016-07-02: 1 via ORAL
  Filled 2016-07-02: qty 1

## 2016-07-02 NOTE — ED Provider Notes (Signed)
[facility] DEPT Provider Note   CSN: [id number] Arrival date & time: 07/02/16  0830  History   Chief Complaint Chief Complaint  Patient presents with  . Knee Pain    HPI [name] is a 50 y.o. female.  HPI History of right medial partial knee replacement on 06/26/16. Currently on blood thinners for atrial fibrillation; started her Savaysa back on 06/27/16 following surgery. Started Physical Therapy on Monday 11/20 and the following day noted worsening bruising, pain, and swelling, which has been spreading since that time. Bruising, pain, and swelling now involving most of medial leg from groin to ankle. Some tenderness in calf. Denies shortness of breath. History of blood clots noted when she was younger.   Past Medical History:  Diagnosis Date  . Adenomyosis 7/07  . Anemia   . Arthritis   . Atrial fibrillation ([place])   . Bleeding from the nose   . Bronchitis   . Dysrhythmia   . GERD (gastroesophageal reflux disease)   . H/O blood clots    superficial blood clots with OCPs  . Hypertension   . Obesity   . Sinus congestion    saw ENT, had nasal surgery to clean out nasal area for bacteria, mold  . Uterine fibroid   . Vitamin D deficiency     Patient Active Problem List   Diagnosis Date Noted  . Primary osteoarthritis of right knee 06/26/2016  . Aspiration pneumonia ([place]) 10/20/2014  . Traumatic compartment syndrome ([place]) 08/13/2014  . Compartment syndrome of lower extremity ([place]) 08/13/2014  . Atrial fibrillation ([place])   . H/O blood clots   . Adenomyosis   . Anemia   . Hypertension   . Obesity   . Lap band APS, 02/18/2009 11/13/2011    Past Surgical History:  Procedure Laterality Date  . ABDOMINAL HYSTERECTOMY    . BREAST BIOPSY     x2  . CESAREAN SECTION    . ESOPHAGOGASTRODUODENOSCOPY N/A 06/15/2016   Procedure: ESOPHAGOGASTRODUODENOSCOPY (EGD);  Surgeon: Alphonsa Overall, MD;  Location: [facility];  Service: General;  Laterality: N/A;  . LAPAROSCOPIC  GASTRIC BANDING  2010  . LAPAROSCOPY     x2  . MOUTH SURGERY    . NASAL SINUS SURGERY  4/13  . PARTIAL KNEE ARTHROPLASTY Right 06/26/2016   Procedure: RIGHT UNICOMPARTMENTAL KNEE CODYLE AND PLATEAU MEDIAL COMPARTMENT;  Surgeon: Renette Butters, MD;  Location: [facility];  Service: Orthopedics;  Laterality: Right;    OB History    Gravida Para Term Preterm AB Living   2 1       1    SAB TAB Ectopic Multiple Live Births                  Obstetric Comments   One adopted child       Home Medications    Prior to Admission medications   Medication Sig Start Date End Date Taking? Authorizing Provider  albuterol (PROVENTIL) (2.5 MG/3ML) 0.083% nebulizer solution Take 3 mLs (2.5 mg total) by nebulization every 2 (two) hours as needed for wheezing. 10/22/14  Yes Thurnell Lose, MD  CARTIA XT 180 MG 24 hr capsule Take 180 mg by mouth daily. 05/03/14  Yes Historical Provider, MD  DEXILANT 60 MG capsule Take 60 mg by mouth daily.  07/31/13  Yes Historical Provider, MD  docusate sodium (COLACE) 100 MG capsule Take 1 capsule (100 mg total) by mouth 2 (two) times daily. 06/26/16  Yes Prudencio Burly III, PA-C  escitalopram (LEXAPRO) 20 MG tablet Take 20 mg by mouth daily.   Yes Historical Provider, MD  flecainide (TAMBOCOR) 150 MG tablet Take 150 mg by mouth 2 (two) times daily.   Yes Historical Provider, MD  loratadine (CLARITIN) 10 MG tablet Take 10 mg by mouth daily.   Yes Historical Provider, MD  methocarbamol (ROBAXIN) 500 MG tablet Take 1 tablet (500 mg total) by mouth every 6 (six) hours as needed for muscle spasms. 06/26/16  Yes Charna Elizabeth Martensen III, PA-C  metoprolol succinate (TOPROL-XL) 50 MG 24 hr tablet Take 50 mg by mouth daily. Take with or immediately following a meal.   Yes Historical Provider, MD  NONFORMULARY OR COMPOUNDED [place] compound:  Achilles Tendonitis Cream - Diclofenac 3%, Baclofen 2%, Bupivacaine 1%, Gabapentin 6%, Ibuprofen  3%, Pentoxifylline 3%, dispense 120grams, apply 1-2 grams to affected area 3-4 times daily, +3Refills 12/18/15  Yes Wallene Huh, DPM  olmesartan-hydrochlorothiazide (BENICAR HCT) 40-25 MG per tablet Take 1 tablet by mouth daily.   Yes Historical Provider, MD  oxyCODONE-acetaminophen (ROXICET) 5-325 MG tablet Take 1-2 tablets by mouth every 4 (four) hours as needed for severe pain. 06/26/16  Yes Henry Calvin Martensen III, PA-C  SAVAYSA 60 MG TABS tablet Take 60 mg by mouth daily. 05/03/14  Yes Historical Provider, MD    Family History Family History  Problem Relation Age of Onset  . Cancer Father     prostate and lung  . Diabetes Father   . Hypertension Father   . Diabetes Mother   . Hypertension Mother   . Ovarian cancer Paternal Grandmother   . Hypertension Brother     x2  . Hypothyroidism Brother   . Hypothyroidism Maternal Grandmother     Social History Social History  Substance Use Topics  . Smoking status: Never Smoker  . Smokeless tobacco: Never Used  . Alcohol use 0.5 - 1.0 oz/week    1 - 2 Standard drinks or equivalent per week     Comment: "periodically" 1 or twice a week a glass of wine     Allergies   Flagyl [metronidazole] and Levaquin [levofloxacin]   Review of Systems Review of Systems  Constitutional: Negative for fever.  Respiratory: Negative for shortness of breath.   Cardiovascular: Negative for chest pain.  Musculoskeletal: Positive for arthralgias, gait problem and joint swelling.  Skin: Positive for color change.   Physical Exam Updated Vital Signs BP (!) 160/104 (BP Location: Right Arm)   Pulse 70   Temp 98.1 F (36.7 C) (Oral)   Resp 16   LMP 08/10/2005   SpO2 100%   Physical Exam  Constitutional: She appears well-developed and well-nourished. No distress.  HENT:  Head: Normocephalic and atraumatic.  Cardiovascular: Normal rate, regular rhythm and intact distal pulses.   No murmur heard. Pulmonary/Chest: Effort normal. No  respiratory distress. She has no wheezes.  Abdominal: Soft. She exhibits no distension. There is no tenderness.  Musculoskeletal:  Significant edema of right leg with larger calf circumference of right compared to left. Tenderness along right medial knee.  Skin:  Bruising noted along most of medial leg from groin to ankle  Psychiatric: She has a normal mood and affect. Her behavior is normal.   ED Treatments / Results  Labs (all labs ordered are listed, but only abnormal results are displayed) Labs Reviewed  CBC - Abnormal; Notable for the following:       Result Value   Hemoglobin 10.4 (*)    HCT  32.1 (*)    All other components within normal limits    EKG  EKG Interpretation None      Radiology No results found.  Procedures Procedures (including critical care time)  Medications Ordered in ED Medications - No data to display   Initial Impression / Assessment and Plan / ED Course  I have reviewed the triage vital signs and the nursing notes.  Pertinent labs & imaging results that were available during my care of the patient were reviewed by me and considered in my medical decision making (see chart for details).  Clinical Course   Discussed with Orthopedics. Recommends CBC and Duplex to rule out clot. Otherwise, recommend discussing holding blood thinner for one week with Cardiology. TED hose. Follow up on Monday with Dr. Percell Miller.  CBC with hemoglobin 10.4 Duplex without DVT or arterial concerns  Discussed with Cardiology. Recommends holding Savaysa for one week. Recommends Aspirin 81mg  daily.    Final Clinical Impressions(s) / ED Diagnoses   Final diagnoses:  Post-op pain  Follow up with Orthopedics on Monday. Hold Savaysa for one week and use Aspirin 81mg  instead. Recommend elevation and compression stockings. May restart Savaysa in one week.  New Prescriptions New Prescriptions   No medications on file     Lorna Few, DO 07/02/16 [place], MD 07/06/16 [phone]

## 2016-07-02 NOTE — ED Notes (Signed)
Dr. Dolly Rias aware of BP and approves DC

## 2016-07-02 NOTE — ED Triage Notes (Signed)
Pt sts had right partial knee replacement; pt with increasing bruising and pain to leg; pt takes blood thinners

## 2016-07-02 NOTE — ED Notes (Signed)
Paged MD Nasher to [facility] A provider's desk phone [phone]

## 2016-07-02 NOTE — Discharge Instructions (Signed)
I spoke with Orthopedics and they recommend holding your blood thinner for one week. They also recommend elevation as long as possible every day and wearing your compression stockings. Please follow up with your Orthopedist on Monday.  I spoke with Dr. Einar Gip and he agreed to hold your blood thinner for one week. He recommend you take a baby Aspirin 81mg  daily during this time and then restarting Savaysa after one week.

## 2016-07-02 NOTE — Progress Notes (Signed)
*  PRELIMINARY RESULTS* Vascular Ultrasound Lower Extremity Arterial Duplex has been completed.  Preliminary findings: Per doctor limited evaluation of right lower extremity for aneurysm.  Right lower extremity negative for groin or popliteal aneurysm and pseudoaneurysm. Nothing seen medial and anterior right knee, area of complaint.  Myrtie Cruise Lanelle Lindo 07/02/2016, 11:22 AM

## 2016-07-02 NOTE — Progress Notes (Signed)
*  PRELIMINARY RESULTS* Vascular Ultrasound Right lower extremity venous duplex has been completed.  Preliminary findings: No evidence of deep vein thrombosis or baker's cysts in the right lower extremity.   Everrett Coombe 07/02/2016, 10:03 AM

## 2016-07-02 NOTE — ED Notes (Signed)
Patient transported to Ultrasound 

## 2016-07-03 LAB — VAS US LOWER EXTREMITY ARTERIAL DUPLEX
RIGHT POPLITEAL DIST EDV: -14 cm/s
RIGHT POPLITEAL PROX EDV: 16 cm/s
RIGHT SUPER FEMORAL MID EDV: -24 cm/s
Right popliteal dist sys PSV: -70 cm/s
Right popliteal prox sys PSV: 90 cm/s
Right super femoral mid sys PSV: -126 cm/s

## 2016-07-14 DIAGNOSIS — G4730 Sleep apnea, unspecified: Secondary | ICD-10-CM | POA: Insufficient documentation

## 2016-08-10 HISTORY — PX: ROUX-EN-Y GASTRIC BYPASS: SHX1104

## 2016-08-11 ENCOUNTER — Encounter: Payer: Self-pay | Admitting: Dietician

## 2016-08-11 ENCOUNTER — Encounter: Payer: BLUE CROSS/BLUE SHIELD | Attending: Surgery | Admitting: Dietician

## 2016-08-11 DIAGNOSIS — Z713 Dietary counseling and surveillance: Secondary | ICD-10-CM | POA: Insufficient documentation

## 2016-08-11 DIAGNOSIS — E669 Obesity, unspecified: Secondary | ICD-10-CM

## 2016-08-11 NOTE — Progress Notes (Signed)
  Pre-Op Assessment Visit:  Pre-Operative Sleeve gastrectomy Surgery  Medical Nutrition Therapy:  Appt start time: 0815   End time:  0900.  Patient was seen on 08/11/2016 for Pre-Operative Nutrition Assessment. Assessment and letter of approval faxed to [facility] Bariatric Surgery Program coordinator on 08/11/2016.   Preferred Learning Style:   No preference indicated   Learning Readiness:   Ready  Handouts given during visit include:  Pre-Op Goals Bariatric Surgery Protein Shakes   During the appointment today the following Pre-Op Goals were reviewed with the patient: Maintain or lose weight as instructed by your surgeon Make healthy food choices Begin to limit portion sizes Limited concentrated sugars and fried foods Keep fat/sugar in the single digits per serving on   food labels Practice CHEWING your food  (aim for 30 chews per bite or until applesauce consistency) Practice not drinking 15 minutes before, during, and 30 minutes after each meal/snack Avoid all carbonated beverages  Avoid/limit caffeinated beverages  Avoid all sugar-sweetened beverages Consume 3 meals per day; eat every 3-5 hours Make a list of non-food related activities Aim for 64-100 ounces of FLUID daily  Aim for at least 60-80 grams of PROTEIN daily Look for a liquid protein source that contain ?15 g protein and ?5 g carbohydrate  (ex: shakes, drinks, shots)  Demonstrated degree of understanding via:  Teach Back  Teaching Method Utilized:  Visual Auditory Hands on  Barriers to learning/adherence to lifestyle change: none  Patient to call the [facility] to enroll in Pre-Op and Post-Op Nutrition Education when surgery date is scheduled.

## 2016-08-21 ENCOUNTER — Other Ambulatory Visit: Payer: Self-pay | Admitting: Family Medicine

## 2016-08-21 DIAGNOSIS — Z1231 Encounter for screening mammogram for malignant neoplasm of breast: Secondary | ICD-10-CM

## 2016-09-01 ENCOUNTER — Ambulatory Visit
Admission: RE | Admit: 2016-09-01 | Discharge: 2016-09-01 | Disposition: A | Discharge status: Home / Self Care | Payer: BLUE CROSS/BLUE SHIELD | Source: Ambulatory Visit | Attending: Family Medicine | Admitting: Family Medicine

## 2016-09-01 DIAGNOSIS — Z1231 Encounter for screening mammogram for malignant neoplasm of breast: Secondary | ICD-10-CM

## 2017-04-05 ENCOUNTER — Encounter: Payer: Self-pay | Admitting: Obstetrics & Gynecology

## 2017-04-05 ENCOUNTER — Ambulatory Visit (INDEPENDENT_AMBULATORY_CARE_PROVIDER_SITE_OTHER): Payer: BLUE CROSS/BLUE SHIELD | Admitting: Obstetrics & Gynecology

## 2017-04-05 VITALS — BP 144/78 | HR 68 | Resp 16 | Ht 68.5 in | Wt 304.0 lb

## 2017-04-05 DIAGNOSIS — R232 Flushing: Secondary | ICD-10-CM | POA: Diagnosis not present

## 2017-04-05 DIAGNOSIS — Z01419 Encounter for gynecological examination (general) (routine) without abnormal findings: Secondary | ICD-10-CM | POA: Diagnosis not present

## 2017-04-05 MED ORDER — GABAPENTIN 100 MG PO CAPS: ORAL_CAPSULE | ORAL | 1 refills | Status: DC

## 2017-04-05 NOTE — Progress Notes (Signed)
51 y.o. G2P1 Divorced [place] [employer] F here for annual exam.  Old patient of mine who I haven't seen in three years.  Having Roux-en-Y in three weeks.  Doing this at [facility] with Dr. Toney Rakes.    Denies vaginal bleeding.    Having hot flashes and some sleep issues.  Wonders about  Patient's last menstrual period was 08/10/2005.          Sexually active: No.  The current method of family planning is status post hysterectomy.    Exercising: Yes.    walking Smoker:  no  Health Maintenance: Pap:  12/10/05 negative  History of abnormal Pap:  no MMG:  09/01/16 BIRADS 1 negative Colonoscopy:  2010 with Dr. Earlean Shawl- patient states she is due BMD:   never TDaP:  2017 at [facility] Pneumonia vaccine(s):  PCP Zostavax:   PCP Hep C testing: not indicated  Screening Labs: PCP, Hb today: PCP   reports that she has never smoked. She has never used smokeless tobacco. She reports that she drinks about 0.5 - 1.0 oz of alcohol per week . She reports that she does not use drugs.  Past Medical History:  Diagnosis Date  . Adenomyosis 7/07  . Anemia   . Arthritis   . Atrial fibrillation ([place])   . Bleeding from the nose   . Bronchitis   . Dysrhythmia   . GERD (gastroesophageal reflux disease)   . H/O blood clots    superficial blood clots with OCPs  . Hypertension   . Obesity   . Sinus congestion    saw ENT, had nasal surgery to clean out nasal area for bacteria, mold  . Uterine fibroid   . Vitamin D deficiency     Past Surgical History:  Procedure Laterality Date  . ABDOMINAL HYSTERECTOMY    . BREAST BIOPSY     x2  . CESAREAN SECTION    . ESOPHAGOGASTRODUODENOSCOPY N/A 06/15/2016   Procedure: ESOPHAGOGASTRODUODENOSCOPY (EGD);  Surgeon: Alphonsa Overall, MD;  Location: [facility];  Service: General;  Laterality: N/A;  . LAPAROSCOPIC GASTRIC BANDING  2010  . LAPAROSCOPY     x2  . MOUTH SURGERY    . NASAL SINUS SURGERY  4/13  . PARTIAL KNEE ARTHROPLASTY Right 06/26/2016   Procedure:  RIGHT UNICOMPARTMENTAL KNEE CODYLE AND PLATEAU MEDIAL COMPARTMENT;  Surgeon: Renette Butters, MD;  Location: [facility];  Service: Orthopedics;  Laterality: Right;    Current Outpatient Prescriptions  Medication Sig Dispense Refill  . albuterol (PROVENTIL) (2.5 MG/3ML) 0.083% nebulizer solution Take 3 mLs (2.5 mg total) by nebulization every 2 (two) hours as needed for wheezing. 75 mL 12  . ALPRAZolam (XANAX) 0.5 MG tablet     . azelastine (ASTELIN) 0.1 % nasal spray     . CARTIA XT 180 MG 24 hr capsule Take 180 mg by mouth daily.    . Cholecalciferol (VITAMIN D3 PO) Take 6,000 Int'l Units by mouth daily.    Marland Kitchen DEXILANT 60 MG capsule Take 60 mg by mouth as needed.     . docusate sodium (COLACE) 100 MG capsule Take 1 capsule (100 mg total) by mouth 2 (two) times daily. (Patient taking differently: Take 100 mg by mouth as needed. ) 60 capsule 0  . escitalopram (LEXAPRO) 20 MG tablet Take 20 mg by mouth daily.    . flecainide (TAMBOCOR) 150 MG tablet Take 150 mg by mouth 2 (two) times daily.    . IRON PO Take by mouth.    Marland Kitchen  loratadine (CLARITIN) 10 MG tablet Take 10 mg by mouth daily.    . metoprolol succinate (TOPROL-XL) 50 MG 24 hr tablet Take 50 mg by mouth daily. Take with or immediately following a meal.    . montelukast (SINGULAIR) 10 MG tablet     . Multiple Vitamins-Minerals (MULTIVITAMIN WITH MINERALS) tablet Take by mouth.    . olmesartan-hydrochlorothiazide (BENICAR HCT) 40-25 MG per tablet Take 1 tablet by mouth daily.    Marland Kitchen PENNSAID 2 % SOLN APPLY TWO PUMPS TOPICALLY TO THE AFFECTED AREA AS NEEDED FOR JOINT PAIN  2  . Probiotic Product (PROBIOTIC PO) Take by mouth.    Marland Kitchen SAVAYSA 60 MG TABS tablet Take 60 mg by mouth daily.    . SYMBICORT 160-4.5 MCG/ACT inhaler      No current facility-administered medications for this visit.     Family History  Problem Relation Age of Onset  . Cancer Father        prostate and lung  . Diabetes Father   . Hypertension Father    . Diabetes Mother   . Hypertension Mother   . Ovarian cancer Paternal Grandmother   . Hypertension Brother        x2  . Hypothyroidism Brother   . Hypothyroidism Maternal Grandmother     ROS:  Pertinent items are noted in HPI.  Otherwise, a comprehensive ROS was negative.  Exam:   BP (!) 144/78 (BP Location: Right Arm, Patient Position: Sitting, Cuff Size: Large)   Pulse 68   Resp 16   Ht 5' 8.5" (1.74 m)   Wt (!) 304 lb (137.9 kg)   LMP 08/10/2005   BMI 45.55 kg/m    Height: 5' 8.5" (174 cm)  Ht Readings from Last 3 Encounters:  04/05/17 5' 8.5" (1.74 m)  08/11/16 5\' 9"  (1.753 m)  06/26/16 5\' 9"  (1.753 m)    General appearance: alert, cooperative and appears stated age Head: Normocephalic, without obvious abnormality, atraumatic Neck: no adenopathy, supple, symmetrical, trachea midline and thyroid normal to inspection and palpation Lungs: clear to auscultation bilaterally Breasts: normal appearance, no masses or tenderness Heart: regular rate and rhythm Abdomen: soft, non-tender; bowel sounds normal; no masses,  no organomegaly Extremities: extremities normal, atraumatic, no cyanosis or edema Skin: Skin color, texture, turgor normal. No rashes or lesions Lymph nodes: Cervical, supraclavicular, and axillary nodes normal. No abnormal inguinal nodes palpated Neurologic: Grossly normal   Pelvic: External genitalia:  no lesions              Urethra:  normal appearing urethra with no masses, tenderness or lesions              Bartholins and Skenes: normal                 Vagina: normal appearing vagina with normal color and discharge, no lesions              Cervix: no lesions              Pap taken: No. Bimanual Exam:  Uterus:  uterus absent              Adnexa: no mass, fullness, tenderness               Rectovaginal: Confirms               Anus:  normal sphincter tone, no lesions  Chaperone was present for exam.  A:  Well Woman with normal exam Morbid obesity  with failed lap band H/O DVT A fib, on chronic anticoagulation Hot flashes/perimenopausal symptoms   P:   Mammogram guidelines reviewed.  UTD. pap smear not obtained [place] obtained today [place]in will be tried for hot flashes.  She should not use estrogen products or supplements that may contain estrogen.  She will give update in 3-4 wees. return annually or prn

## 2017-04-06 LAB — FOLLICLE STIMULATING HORMONE: FSH: 33.8 m[IU]/mL

## 2017-04-27 ENCOUNTER — Telehealth: Payer: Self-pay | Admitting: Obstetrics & Gynecology

## 2017-04-27 ENCOUNTER — Other Ambulatory Visit: Payer: Self-pay | Admitting: Obstetrics & Gynecology

## 2017-04-27 MED ORDER — GABAPENTIN 300 MG PO CAPS
ORAL_CAPSULE | ORAL | 4 refills | Status: DC
Start: 2017-04-27 — End: 2018-09-22

## 2017-04-27 NOTE — Telephone Encounter (Signed)
Patient says the gabapentin is working well for her and would like a prescription sent to [facility] at [phone].

## 2017-04-27 NOTE — Telephone Encounter (Signed)
Spoke with patient. Reports starting gabapentin on 04/05/17 for hot flashes and insomnia. Sleeping better and hot flashes have decreased, currently taking 300 mg nightly. Requesting refill for gabapentin to [facility], is not out of medication, has one refill. Advised patient will review with Dr. Sabra Heck, once reviewed will return call to advise on refill. Patient verbalizes understanding and agreeable.  Dr. Sabra Heck -ok to send Rx for gabapentin 300 mg caps po nightly #30, refills until next AEX?

## 2017-04-27 NOTE — Telephone Encounter (Signed)
RX sent to [facility] for gabapentin 300mg  nightly.  #90/4RF.  Ok to close encounter.

## 2017-04-28 NOTE — Telephone Encounter (Signed)
Spoke with patient, advised as seen below per Dr. Sabra Heck, [facility]. Patient verbalizes understanding and is agreeable.   Patient is agreeable to disposition. Will close encounter.

## 2017-05-03 DIAGNOSIS — Z9884 Bariatric surgery status: Secondary | ICD-10-CM | POA: Insufficient documentation

## 2017-07-22 DIAGNOSIS — L905 Scar conditions and fibrosis of skin: Secondary | ICD-10-CM | POA: Diagnosis not present

## 2017-07-27 DIAGNOSIS — E569 Vitamin deficiency, unspecified: Secondary | ICD-10-CM | POA: Diagnosis not present

## 2017-07-27 DIAGNOSIS — Z9884 Bariatric surgery status: Secondary | ICD-10-CM | POA: Diagnosis not present

## 2017-07-27 DIAGNOSIS — Z6838 Body mass index (BMI) 38.0-38.9, adult: Secondary | ICD-10-CM | POA: Diagnosis not present

## 2017-07-27 DIAGNOSIS — Z713 Dietary counseling and surveillance: Secondary | ICD-10-CM | POA: Diagnosis not present

## 2017-07-27 DIAGNOSIS — E669 Obesity, unspecified: Secondary | ICD-10-CM | POA: Diagnosis not present

## 2017-07-29 DIAGNOSIS — K136 Irritative hyperplasia of oral mucosa: Secondary | ICD-10-CM | POA: Diagnosis not present

## 2017-08-20 DIAGNOSIS — I10 Essential (primary) hypertension: Secondary | ICD-10-CM | POA: Diagnosis not present

## 2017-08-20 DIAGNOSIS — M79601 Pain in right arm: Secondary | ICD-10-CM | POA: Diagnosis not present

## 2017-08-20 DIAGNOSIS — Z6838 Body mass index (BMI) 38.0-38.9, adult: Secondary | ICD-10-CM | POA: Diagnosis not present

## 2017-08-23 DIAGNOSIS — Z1329 Encounter for screening for other suspected endocrine disorder: Secondary | ICD-10-CM | POA: Diagnosis not present

## 2017-08-23 DIAGNOSIS — Z13220 Encounter for screening for lipoid disorders: Secondary | ICD-10-CM | POA: Diagnosis not present

## 2017-08-23 DIAGNOSIS — Z114 Encounter for screening for human immunodeficiency virus [HIV]: Secondary | ICD-10-CM | POA: Diagnosis not present

## 2017-08-23 DIAGNOSIS — Z0000 Encounter for general adult medical examination without abnormal findings: Secondary | ICD-10-CM | POA: Diagnosis not present

## 2017-09-02 DIAGNOSIS — Z9884 Bariatric surgery status: Secondary | ICD-10-CM | POA: Diagnosis not present

## 2017-09-02 DIAGNOSIS — Z6838 Body mass index (BMI) 38.0-38.9, adult: Secondary | ICD-10-CM | POA: Diagnosis not present

## 2017-10-08 DIAGNOSIS — I10 Essential (primary) hypertension: Secondary | ICD-10-CM | POA: Diagnosis not present

## 2017-10-08 DIAGNOSIS — J069 Acute upper respiratory infection, unspecified: Secondary | ICD-10-CM | POA: Diagnosis not present

## 2017-10-08 DIAGNOSIS — H6121 Impacted cerumen, right ear: Secondary | ICD-10-CM | POA: Diagnosis not present

## 2017-10-08 DIAGNOSIS — H6692 Otitis media, unspecified, left ear: Secondary | ICD-10-CM | POA: Diagnosis not present

## 2017-11-01 DIAGNOSIS — I10 Essential (primary) hypertension: Secondary | ICD-10-CM | POA: Diagnosis not present

## 2017-11-01 DIAGNOSIS — Z0000 Encounter for general adult medical examination without abnormal findings: Secondary | ICD-10-CM | POA: Diagnosis not present

## 2017-11-01 DIAGNOSIS — R739 Hyperglycemia, unspecified: Secondary | ICD-10-CM | POA: Diagnosis not present

## 2017-11-01 DIAGNOSIS — Z1211 Encounter for screening for malignant neoplasm of colon: Secondary | ICD-10-CM | POA: Diagnosis not present

## 2017-11-01 DIAGNOSIS — I480 Paroxysmal atrial fibrillation: Secondary | ICD-10-CM | POA: Diagnosis not present

## 2017-11-01 DIAGNOSIS — Z9884 Bariatric surgery status: Secondary | ICD-10-CM | POA: Diagnosis not present

## 2017-11-01 DIAGNOSIS — G4730 Sleep apnea, unspecified: Secondary | ICD-10-CM | POA: Diagnosis not present

## 2017-11-01 DIAGNOSIS — Z131 Encounter for screening for diabetes mellitus: Secondary | ICD-10-CM | POA: Diagnosis not present

## 2017-11-08 DIAGNOSIS — M25562 Pain in left knee: Secondary | ICD-10-CM | POA: Diagnosis not present

## 2017-11-08 DIAGNOSIS — M25521 Pain in right elbow: Secondary | ICD-10-CM | POA: Diagnosis not present

## 2017-11-23 DIAGNOSIS — Z6838 Body mass index (BMI) 38.0-38.9, adult: Secondary | ICD-10-CM | POA: Diagnosis not present

## 2017-11-23 DIAGNOSIS — M179 Osteoarthritis of knee, unspecified: Secondary | ICD-10-CM | POA: Diagnosis not present

## 2017-11-29 DIAGNOSIS — E669 Obesity, unspecified: Secondary | ICD-10-CM | POA: Diagnosis not present

## 2017-11-29 DIAGNOSIS — Z6837 Body mass index (BMI) 37.0-37.9, adult: Secondary | ICD-10-CM | POA: Diagnosis not present

## 2017-11-29 DIAGNOSIS — Z9884 Bariatric surgery status: Secondary | ICD-10-CM | POA: Diagnosis not present

## 2017-11-29 DIAGNOSIS — Z713 Dietary counseling and surveillance: Secondary | ICD-10-CM | POA: Diagnosis not present

## 2017-11-30 ENCOUNTER — Encounter: Payer: Self-pay | Admitting: Gastroenterology

## 2017-12-02 DIAGNOSIS — K625 Hemorrhage of anus and rectum: Secondary | ICD-10-CM | POA: Diagnosis not present

## 2017-12-02 DIAGNOSIS — K219 Gastro-esophageal reflux disease without esophagitis: Secondary | ICD-10-CM | POA: Diagnosis not present

## 2017-12-02 DIAGNOSIS — K5900 Constipation, unspecified: Secondary | ICD-10-CM | POA: Diagnosis not present

## 2017-12-02 DIAGNOSIS — Z1211 Encounter for screening for malignant neoplasm of colon: Secondary | ICD-10-CM | POA: Diagnosis not present

## 2017-12-17 DIAGNOSIS — K635 Polyp of colon: Secondary | ICD-10-CM | POA: Diagnosis not present

## 2017-12-17 DIAGNOSIS — Z1211 Encounter for screening for malignant neoplasm of colon: Secondary | ICD-10-CM | POA: Diagnosis not present

## 2017-12-17 DIAGNOSIS — D127 Benign neoplasm of rectosigmoid junction: Secondary | ICD-10-CM | POA: Diagnosis not present

## 2017-12-27 DIAGNOSIS — Z9884 Bariatric surgery status: Secondary | ICD-10-CM | POA: Diagnosis not present

## 2017-12-27 DIAGNOSIS — F4320 Adjustment disorder, unspecified: Secondary | ICD-10-CM | POA: Diagnosis not present

## 2018-01-14 ENCOUNTER — Ambulatory Visit: Payer: BLUE CROSS/BLUE SHIELD | Admitting: Gastroenterology

## 2018-01-18 ENCOUNTER — Ambulatory Visit: Payer: BLUE CROSS/BLUE SHIELD | Admitting: Gastroenterology

## 2018-02-03 DIAGNOSIS — J3089 Other allergic rhinitis: Secondary | ICD-10-CM | POA: Diagnosis not present

## 2018-02-03 DIAGNOSIS — I10 Essential (primary) hypertension: Secondary | ICD-10-CM | POA: Diagnosis not present

## 2018-02-03 DIAGNOSIS — R51 Headache: Secondary | ICD-10-CM | POA: Diagnosis not present

## 2018-02-03 DIAGNOSIS — R42 Dizziness and giddiness: Secondary | ICD-10-CM | POA: Diagnosis not present

## 2018-02-08 DIAGNOSIS — I10 Essential (primary) hypertension: Secondary | ICD-10-CM | POA: Diagnosis not present

## 2018-02-08 DIAGNOSIS — R42 Dizziness and giddiness: Secondary | ICD-10-CM | POA: Diagnosis not present

## 2018-02-08 DIAGNOSIS — R51 Headache: Secondary | ICD-10-CM | POA: Diagnosis not present

## 2018-02-08 DIAGNOSIS — F411 Generalized anxiety disorder: Secondary | ICD-10-CM | POA: Diagnosis not present

## 2018-02-08 DIAGNOSIS — E119 Type 2 diabetes mellitus without complications: Secondary | ICD-10-CM | POA: Diagnosis not present

## 2018-02-08 DIAGNOSIS — F331 Major depressive disorder, recurrent, moderate: Secondary | ICD-10-CM | POA: Diagnosis not present

## 2018-02-08 DIAGNOSIS — E1165 Type 2 diabetes mellitus with hyperglycemia: Secondary | ICD-10-CM | POA: Diagnosis not present

## 2018-04-04 DIAGNOSIS — Z23 Encounter for immunization: Secondary | ICD-10-CM | POA: Diagnosis not present

## 2018-04-12 ENCOUNTER — Other Ambulatory Visit: Payer: Self-pay | Admitting: Obstetrics & Gynecology

## 2018-04-15 DIAGNOSIS — M25562 Pain in left knee: Secondary | ICD-10-CM | POA: Diagnosis not present

## 2018-04-19 ENCOUNTER — Encounter: Payer: Self-pay | Admitting: Pulmonary Disease

## 2018-04-19 ENCOUNTER — Ambulatory Visit: Payer: BLUE CROSS/BLUE SHIELD | Admitting: Pulmonary Disease

## 2018-04-19 ENCOUNTER — Telehealth: Payer: Self-pay | Admitting: Pulmonary Disease

## 2018-04-19 VITALS — BP 138/82 | HR 64 | Ht 69.0 in | Wt 264.0 lb

## 2018-04-19 DIAGNOSIS — G4733 Obstructive sleep apnea (adult) (pediatric): Secondary | ICD-10-CM

## 2018-04-19 NOTE — Progress Notes (Signed)
[name]    [id number]    1966/01/18  Primary Care Physician:Dewey, Mechele Claude, MD  Referring Physician: Fanny Bien, MD [address] [place], [place] [address]  Chief complaint:   History of obstructive sleep apnea Recently been evaluated for left knee arthroplasty  HPI: History of obstructive sleep apnea-on CPAP therapy Compliant with treatment Has had difficulty with using CPAP regularly secondary to traveling-she feels she will require a portable device and this will help with her continued compliance Did not have any difficulty tolerating CPAP therapy and was compliant with use She did notice improvement in symptoms when she was using it regularly Currently being evaluated for arthroplasty Has no respiratory complaints Sleeps well and functions well  A sibling may have obstructive sleep apnea  Usually goes to bed between 9-and 12 Wakes up about 5-6 AM  History of obstructive sleep apnea, high blood pressure Has not been having any health issues recently  Outpatient Encounter Medications as of 04/19/2018  Medication Sig  . albuterol (PROVENTIL) (2.5 MG/3ML) 0.083% nebulizer solution Take 3 mLs (2.5 mg total) by nebulization every 2 (two) hours as needed for wheezing.  Marland Kitchen azelastine (ASTELIN) 0.1 % nasal spray   . CARTIA XT 180 MG 24 hr capsule Take 180 mg by mouth daily.  . Cholecalciferol (VITAMIN D3 PO) Take 6,000 Int'l Units by mouth daily.  Marland Kitchen DEXILANT 60 MG capsule Take 60 mg by mouth as needed.   . docusate sodium (COLACE) 100 MG capsule Take 1 capsule (100 mg total) by mouth 2 (two) times daily.  Marland Kitchen escitalopram (LEXAPRO) 20 MG tablet Take 20 mg by mouth daily.  . flecainide (TAMBOCOR) 150 MG tablet Take 150 mg by mouth 2 (two) times daily.  Marland Kitchen gabapentin (NEURONTIN) 300 MG capsule 1 po nightly.  . IRON PO Take by mouth.  . loratadine (CLARITIN) 10 MG tablet Take 10 mg by mouth daily.  . metoprolol succinate (TOPROL-XL) 50 MG 24 hr  tablet Take 50 mg by mouth daily. Take with or immediately following a meal.  . Multiple Vitamins-Minerals (MULTIVITAMIN WITH MINERALS) tablet Take by mouth.  . olmesartan-hydrochlorothiazide (BENICAR HCT) 40-25 MG per tablet Take 1 tablet by mouth daily.  . Probiotic Product (PROBIOTIC PO) Take by mouth.  Marland Kitchen SAVAYSA 60 MG TABS tablet Take 60 mg by mouth daily.  Marland Kitchen ALPRAZolam (XANAX) 0.5 MG tablet   . montelukast (SINGULAIR) 10 MG tablet   . PENNSAID 2 % SOLN APPLY TWO PUMPS TOPICALLY TO THE AFFECTED AREA AS NEEDED FOR JOINT PAIN  . SYMBICORT 160-4.5 MCG/ACT inhaler    No facility-administered encounter medications on file as of 04/19/2018.     Allergies as of 04/19/2018 - Review Complete 04/19/2018  Allergen Reaction Noted  . Flagyl [metronidazole] Anaphylaxis 04/23/2012  . Levaquin [levofloxacin] Other (See Comments) 10/20/2014    Past Medical History:  Diagnosis Date  . Adenomyosis 7/07  . Anemia   . Arthritis   . Atrial fibrillation ([place])   . Bleeding from the nose   . Bronchitis   . Dysrhythmia   . GERD (gastroesophageal reflux disease)   . H/O blood clots    superficial blood clots with OCPs  . Hypertension   . Obesity   . Sinus congestion    saw ENT, had nasal surgery to clean out nasal area for bacteria, mold  . Uterine fibroid   . Vitamin D deficiency     Past Surgical History:  Procedure Laterality Date  . ABDOMINAL HYSTERECTOMY    . BREAST BIOPSY     x2  . CESAREAN SECTION    . ESOPHAGOGASTRODUODENOSCOPY N/A 06/15/2016   Procedure: ESOPHAGOGASTRODUODENOSCOPY (EGD);  Surgeon: Alphonsa Overall, MD;  Location: [facility];  Service: General;  Laterality: N/A;  . LAPAROSCOPIC GASTRIC BANDING  2010  . LAPAROSCOPY     x2  . MOUTH SURGERY    . NASAL SINUS SURGERY  4/13  . PARTIAL KNEE ARTHROPLASTY Right 06/26/2016   Procedure: RIGHT UNICOMPARTMENTAL KNEE CODYLE AND PLATEAU MEDIAL COMPARTMENT;  Surgeon: Renette Butters, MD;  Location: [facility];   Service: Orthopedics;  Laterality: Right;    Family History  Problem Relation Age of Onset  . Cancer Father        prostate and lung  . Diabetes Father   . Hypertension Father   . Diabetes Mother   . Hypertension Mother   . Ovarian cancer Paternal Grandmother   . Hypertension Brother        x2  . Hypothyroidism Brother   . Hypothyroidism Maternal Grandmother     Social History   Socioeconomic History  . Marital status: Divorced    Spouse name: Not on file  . Number of children: 2  . Years of education: MASTERS  . Highest education level: Not on file  Occupational History    Employer: [employer]  . Financial resource strain: Not on file  . Food insecurity:    Worry: Not on file    Inability: Not on file  . Transportation needs:    Medical: Not on file    Non-medical: Not on file  Tobacco Use  . Smoking status: Never Smoker  . Smokeless tobacco: Never Used  Substance and Sexual Activity  . Alcohol use: Yes    Alcohol/week: 1.0 - 2.0 standard drinks    Types: 1 - 2 Standard drinks or equivalent per week    Comment: "periodically" 1 or twice a week a glass of wine  . Drug use: No  . Sexual activity: Never    Partners: Male    Birth control/protection: Surgical    Comment: TAH  Lifestyle  . Physical activity:    Days per week: Not on file    Minutes per session: Not on file  . Stress: Not on file  Relationships  . Social connections:    Talks on phone: Not on file    Gets together: Not on file    Attends religious service: Not on file    Active member of club or organization: Not on file    Attends meetings of clubs or organizations: Not on file    Relationship status: Not on file  . Intimate partner violence:    Fear of current or ex partner: Not on file    Emotionally abused: Not on file    Physically abused: Not on file    Forced sexual activity: Not on file  Other Topics Concern  . Not on file  Social History Narrative   Patient is divorced  with 2 children.   Patient is right handed.   Patient has her Master's degree.   Patient drinks 1-2 sodas daily.        Review of Systems  Constitutional: Negative.   Respiratory: Positive for apnea. Negative for shortness of breath.   Gastrointestinal: Negative.   Musculoskeletal: Positive for arthralgias.  Psychiatric/Behavioral: Positive for sleep disturbance.  All other systems reviewed and are negative.  Vitals:   04/19/18 1212  BP: 138/82  Pulse: 64  SpO2: 99%     Physical Exam  Constitutional: She is oriented to person, place, and time. She appears well-developed and well-nourished.  HENT:  Head: Normocephalic.  Mallampati 4  Eyes: Pupils are equal, round, and reactive to light. Conjunctivae and EOM are normal. Right eye exhibits no discharge. Left eye exhibits no discharge.  Neck: Normal range of motion. Neck supple. No tracheal deviation present. No thyromegaly present.  Cardiovascular: Normal rate and regular rhythm.  Pulmonary/Chest: Effort normal and breath sounds normal. No respiratory distress.  Abdominal: Soft. Bowel sounds are normal. She exhibits no distension. There is no tenderness.  Musculoskeletal: Normal range of motion. She exhibits no edema or deformity.  Neurological: She is alert and oriented to person, place, and time. She has normal reflexes. No cranial nerve deficit. Coordination normal.  Skin: Skin is warm and dry. No rash noted. No erythema.  Psychiatric: She has a normal mood and affect. Her behavior is normal.    Data Reviewed: CPAP history from 06/25/2014 shows that she is on CPAP of 10, EPR of 3 Medium size Fisher-Paykel eson Nasal mask with heated humidification chinstrap  Assessment:   Obstructive sleep apnea  She requires left knee arthroplasty  Not having any significant health issues currently No respiratory complaints  Plan/Recommendations:  We will obtain a download, compliance data from [place]  Previous CPAP  settings as noted above  A prescription for CPAP-she wants to procure a portable device  A prescription will be made available  She is clear for surgery from a pulmonary/sleep perspective  Pre-and perioperative measures to be put in place for known obstructive sleep apnea  May require oxygen supplementation, may require CPAP-CPAP of 10 is what she is on, may require pressure device if she requires significant opiates for analgesia and other medications for sedation   Sherrilyn Rist MD [facility] 04/19/2018, 12:27 PM  CC: Fanny Bien, MD

## 2018-04-19 NOTE — Patient Instructions (Addendum)
Patient with obstructive sleep apnea  Compliant with CPAP, issues with noncompliance related to traveling  As you have no significant respiratory complaints today No difficulty tolerating CPAP therapy  I will make a notation that you are cleared to go ahead with surgery Measures will be put in place to treat obstructive sleep apnea pre-and perioperatively  I will tentatively see you back in 6 months/as needed  We will get some information from [place] to assess your previous compliance The prescription can be made out based on what your current settings are to procure a portable device

## 2018-04-19 NOTE — Telephone Encounter (Signed)
CPAP prescription to go to [facility] for patient to procure a portable device  CPAP history from 06/25/2014 shows that she is on CPAP of 10, EPR of 3 Medium size Fisher-Paykel eson Nasal mask with heated humidification and chinstrap

## 2018-04-21 NOTE — Telephone Encounter (Signed)
Placed order for the protable Cpap to [facility]. Nothing further needed. Patient is aware of this from ov.

## 2018-04-22 ENCOUNTER — Telehealth: Payer: Self-pay | Admitting: Pulmonary Disease

## 2018-04-22 NOTE — Telephone Encounter (Signed)
Spoke with Bethanne Ginger to verify the msg  They do not carry travel CPAPs She states they can not provide any equipment until she proves compliance  LMTCB for the pt

## 2018-04-25 NOTE — Telephone Encounter (Signed)
LMTCB

## 2018-04-26 NOTE — Telephone Encounter (Signed)
Left message on mobile line per DPR informing her the DME does not provide travel cpap machines. Informed patient she would have to purchase online with out insurance assistance. Nothing further needed at this time.

## 2018-04-26 NOTE — Telephone Encounter (Signed)
Pt is returning call. Cb is [phone]

## 2018-04-26 NOTE — Telephone Encounter (Signed)
ATC pt, no answer. Left message for pt to call back.  

## 2018-04-26 NOTE — Telephone Encounter (Signed)
Patient called back to office, patient would like to know whether you would be willing to write a prescription based off the sleep study so that she can purchase a travel cpap machine. She is willing to pay out of pocket, she just needs a actual written prescription.   AO please advise if willing to write prescription for patient so she can purchases travel cpap. Thanks.

## 2018-05-02 NOTE — Telephone Encounter (Signed)
Yes she can have a prescription written for CPAP  I believe she is willing to procure it herself  CPAP history from 06/25/2014 shows that she is on CPAP of 10,EPR of3 Medium size Fisher-PaykelesonNasal mask with heated humidification and chinstrap  Prescription as above can be made available to her

## 2018-05-02 NOTE — Telephone Encounter (Signed)
Spoke with patient to make sure everything had been taken of. She stated that this has been taken care of. Will close this encounter in triage.

## 2018-05-16 DIAGNOSIS — F320 Major depressive disorder, single episode, mild: Secondary | ICD-10-CM | POA: Diagnosis not present

## 2018-05-16 DIAGNOSIS — K219 Gastro-esophageal reflux disease without esophagitis: Secondary | ICD-10-CM | POA: Diagnosis not present

## 2018-05-16 DIAGNOSIS — M170 Bilateral primary osteoarthritis of knee: Secondary | ICD-10-CM | POA: Diagnosis not present

## 2018-05-16 DIAGNOSIS — I10 Essential (primary) hypertension: Secondary | ICD-10-CM | POA: Diagnosis not present

## 2018-06-03 DIAGNOSIS — G4733 Obstructive sleep apnea (adult) (pediatric): Secondary | ICD-10-CM | POA: Diagnosis not present

## 2018-06-03 DIAGNOSIS — Z01810 Encounter for preprocedural cardiovascular examination: Secondary | ICD-10-CM | POA: Diagnosis not present

## 2018-06-03 DIAGNOSIS — I480 Paroxysmal atrial fibrillation: Secondary | ICD-10-CM | POA: Diagnosis not present

## 2018-06-03 DIAGNOSIS — I10 Essential (primary) hypertension: Secondary | ICD-10-CM | POA: Diagnosis not present

## 2018-06-20 ENCOUNTER — Emergency Department (HOSPITAL_COMMUNITY)
Admission: EM | Admit: 2018-06-20 | Discharge: 2018-06-20 | Disposition: A | Discharge status: Home / Self Care | Payer: BLUE CROSS/BLUE SHIELD | Attending: Emergency Medicine | Admitting: Emergency Medicine

## 2018-06-20 ENCOUNTER — Encounter (HOSPITAL_COMMUNITY): Payer: Self-pay | Admitting: Emergency Medicine

## 2018-06-20 DIAGNOSIS — I10 Essential (primary) hypertension: Secondary | ICD-10-CM | POA: Insufficient documentation

## 2018-06-20 DIAGNOSIS — Z96651 Presence of right artificial knee joint: Secondary | ICD-10-CM | POA: Diagnosis not present

## 2018-06-20 DIAGNOSIS — R55 Syncope and collapse: Secondary | ICD-10-CM | POA: Diagnosis not present

## 2018-06-20 DIAGNOSIS — E162 Hypoglycemia, unspecified: Secondary | ICD-10-CM | POA: Diagnosis not present

## 2018-06-20 DIAGNOSIS — Z79899 Other long term (current) drug therapy: Secondary | ICD-10-CM | POA: Insufficient documentation

## 2018-06-20 DIAGNOSIS — F419 Anxiety disorder, unspecified: Secondary | ICD-10-CM | POA: Insufficient documentation

## 2018-06-20 DIAGNOSIS — Z9884 Bariatric surgery status: Secondary | ICD-10-CM | POA: Diagnosis not present

## 2018-06-20 DIAGNOSIS — R0902 Hypoxemia: Secondary | ICD-10-CM | POA: Diagnosis not present

## 2018-06-20 DIAGNOSIS — E161 Other hypoglycemia: Secondary | ICD-10-CM | POA: Diagnosis not present

## 2018-06-20 DIAGNOSIS — F329 Major depressive disorder, single episode, unspecified: Secondary | ICD-10-CM | POA: Insufficient documentation

## 2018-06-20 DIAGNOSIS — E860 Dehydration: Secondary | ICD-10-CM | POA: Diagnosis not present

## 2018-06-20 DIAGNOSIS — I447 Left bundle-branch block, unspecified: Secondary | ICD-10-CM | POA: Diagnosis not present

## 2018-06-20 DIAGNOSIS — E876 Hypokalemia: Secondary | ICD-10-CM | POA: Insufficient documentation

## 2018-06-20 LAB — ETHANOL: Alcohol, Ethyl (B): 134 mg/dL — ABNORMAL HIGH (ref <10)

## 2018-06-20 LAB — COMPREHENSIVE METABOLIC PANEL
ALT: 17 U/L (ref 0–44)
AST: 20 U/L (ref 15–41)
Albumin: 3.4 g/dL — ABNORMAL LOW (ref 3.5–5.0)
Alkaline Phosphatase: 85 U/L (ref 38–126)
Anion gap: 9 (ref 5–15)
BUN: 8 mg/dL (ref 6–20)
CO2: 23 mmol/L (ref 22–32)
Calcium: 8.5 mg/dL — ABNORMAL LOW (ref 8.9–10.3)
Chloride: 103 mmol/L (ref 98–111)
Creatinine, Ser: 0.74 mg/dL (ref 0.44–1.00)
GFR calc Af Amer: >60 mL/min (ref >60)
Glucose, Bld: 86 mg/dL (ref 70–99)
POTASSIUM: 2.9 mmol/L — AB (ref 3.5–5.1)
SODIUM: 135 mmol/L (ref 135–145)
Total Bilirubin: 0.4 mg/dL (ref 0.3–1.2)
Total Protein: 6.1 g/dL — ABNORMAL LOW (ref 6.5–8.1)

## 2018-06-20 LAB — I-STAT TROPONIN, ED: TROPONIN I, POC: 0 ng/mL (ref 0.00–0.08)

## 2018-06-20 LAB — CBC WITH DIFFERENTIAL/PLATELET
ABS IMMATURE GRANULOCYTES: 0.02 10*3/uL (ref 0.00–0.07)
BASOS ABS: 0.1 10*3/uL (ref 0.0–0.1)
BASOS PCT: 1 %
EOS ABS: 0.1 10*3/uL (ref 0.0–0.5)
Eosinophils Relative: 1 %
HCT: 36 % (ref 36.0–46.0)
Hemoglobin: 11.1 g/dL — ABNORMAL LOW (ref 12.0–15.0)
IMMATURE GRANULOCYTES: 0 %
Lymphocytes Relative: 29 %
Lymphs Abs: 1.7 10*3/uL (ref 0.7–4.0)
MCH: 27.9 pg (ref 26.0–34.0)
MCHC: 30.8 g/dL (ref 30.0–36.0)
MCV: 90.5 fL (ref 80.0–100.0)
Monocytes Absolute: 0.7 10*3/uL (ref 0.1–1.0)
Monocytes Relative: 13 %
NEUTROS ABS: 3.2 10*3/uL (ref 1.7–7.7)
NEUTROS PCT: 56 %
PLATELETS: 266 10*3/uL (ref 150–400)
RBC: 3.98 MIL/uL (ref 3.87–5.11)
RDW: 12.9 % (ref 11.5–15.5)
WBC: 5.7 10*3/uL (ref 4.0–10.5)
nRBC: 0 % (ref 0.0–0.2)

## 2018-06-20 MED ORDER — POTASSIUM CHLORIDE 10 MEQ/100ML IV SOLN
10.0000 meq | Freq: Once | INTRAVENOUS | Status: AC
  Administered 2018-06-20: 10 meq via INTRAVENOUS
  Filled 2018-06-20: qty 100

## 2018-06-20 MED ORDER — SODIUM CHLORIDE 0.9 % IV BOLUS
1000.0000 mL | Freq: Once | INTRAVENOUS | Status: AC
  Administered 2018-06-20: 1000 mL via INTRAVENOUS

## 2018-06-20 MED ORDER — POTASSIUM CHLORIDE CRYS ER 20 MEQ PO TBCR
40.0000 meq | EXTENDED_RELEASE_TABLET | Freq: Once | ORAL | Status: AC
  Administered 2018-06-20: 40 meq via ORAL
  Filled 2018-06-20: qty 2

## 2018-06-20 MED ORDER — POTASSIUM CHLORIDE ER 20 MEQ PO TBCR: 10.0000 meq | EXTENDED_RELEASE_TABLET | Freq: Every day | ORAL | 0 refills | Status: DC

## 2018-06-20 NOTE — ED Triage Notes (Signed)
BIB EMS from [facility], witnesses state she was feeling "weak and hot" after ETOH use. Never actually passed out. CBG 74, no hx of DM. Pt is A/OX4

## 2018-06-20 NOTE — ED Provider Notes (Signed)
[facility] EMERGENCY DEPARTMENT Provider Note   CSN: [id number] Arrival date & time: 06/20/18  1935     History   Chief Complaint Chief Complaint  Patient presents with  . Loss of Consciousness    HPI [name] is a 52 y.o. female history of atrial fibrillation on Eliquis, reflux, presenting with near syncope.  Patient states that she did not eat any breakfast or lunch today.  Patient states that she was at the [facility] and was buying food and did drink some alcohol. She then became light headed and dizzy but never passed out. She denies any chest pain or palpitations. Denies any headaches.   The history is provided by the patient.    Past Medical History:  Diagnosis Date  . Adenomyosis 7/07  . Anemia   . Arthritis   . Atrial fibrillation ([place])   . Bleeding from the nose   . Bronchitis   . Dysrhythmia   . GERD (gastroesophageal reflux disease)   . H/O blood clots    superficial blood clots with OCPs  . Hypertension   . Obesity   . Sinus congestion    saw ENT, had nasal surgery to clean out nasal area for bacteria, mold  . Uterine fibroid   . Vitamin D deficiency     Patient Active Problem List   Diagnosis Date Noted  . Sleep apnea 07/14/2016  . Primary osteoarthritis of right knee 06/26/2016  . Compartment syndrome of lower extremity ([place]) 08/13/2014  . Atrial fibrillation ([place])   . H/O blood clots   . Anemia   . Hypertension   . Obesity   . Nodule of right lung 05/05/2012  . GERD (gastroesophageal reflux disease) 11/26/2011  . Lap band APS, 02/18/2009 11/13/2011  . Environmental allergies 11/11/2011  . Anxiety 09/03/2011  . Depressive disorder, not elsewhere classified 10/11/2008  . Gastro-esophageal reflux disease with esophagitis 10/11/2008    Past Surgical History:  Procedure Laterality Date  . ABDOMINAL HYSTERECTOMY    . BREAST BIOPSY     x2  . CESAREAN SECTION    . ESOPHAGOGASTRODUODENOSCOPY N/A 06/15/2016   Procedure: ESOPHAGOGASTRODUODENOSCOPY (EGD);  Surgeon: Alphonsa Overall, MD;  Location: [facility];  Service: General;  Laterality: N/A;  . LAPAROSCOPIC GASTRIC BANDING  2010  . LAPAROSCOPY     x2  . MOUTH SURGERY    . NASAL SINUS SURGERY  4/13  . PARTIAL KNEE ARTHROPLASTY Right 06/26/2016   Procedure: RIGHT UNICOMPARTMENTAL KNEE CODYLE AND PLATEAU MEDIAL COMPARTMENT;  Surgeon: Renette Butters, MD;  Location: [facility];  Service: Orthopedics;  Laterality: Right;     OB History    Gravida  2   Para  1   Term      Preterm      AB      Living  1     SAB      TAB      Ectopic      Multiple      Live Births           Obstetric Comments  One adopted child         Home Medications    Prior to Admission medications   Medication Sig Start Date End Date Taking? Authorizing Provider  albuterol (PROVENTIL) (2.5 MG/3ML) 0.083% nebulizer solution Take 3 mLs (2.5 mg total) by nebulization every 2 (two) hours as needed for wheezing. 10/22/14   Thurnell Lose, MD  ALPRAZolam Duanne Moron) 0.5 MG tablet  01/16/17   [provider]  azelastine (ASTELIN) 0.1 % nasal spray  02/23/17   [provider]  CARTIA XT 180 MG 24 hr capsule Take 180 mg by mouth daily. 05/03/14   [provider]  Cholecalciferol (VITAMIN D3 PO) Take 6,000 Int'l Units by mouth daily.    [provider]  DEXILANT 60 MG capsule Take 60 mg by mouth as needed.  07/31/13   [provider]  docusate sodium (COLACE) 100 MG capsule Take 1 capsule (100 mg total) by mouth 2 (two) times daily. 06/26/16   Prudencio Burly III, PA-C  escitalopram (LEXAPRO) 20 MG tablet Take 20 mg by mouth daily.    [provider]  flecainide (TAMBOCOR) 150 MG tablet Take 150 mg by mouth 2 (two) times daily.    [provider]  gabapentin (NEURONTIN) 300 MG capsule 1 po nightly. 04/27/17   Megan Salon, MD  IRON PO Take by mouth.    [provider]    loratadine (CLARITIN) 10 MG tablet Take 10 mg by mouth daily.    [provider]  metoprolol succinate (TOPROL-XL) 50 MG 24 hr tablet Take 50 mg by mouth daily. Take with or immediately following a meal.    [provider]  montelukast (SINGULAIR) 10 MG tablet  02/23/17   [provider]  Multiple Vitamins-Minerals (MULTIVITAMIN WITH MINERALS) tablet Take by mouth.    [provider]  olmesartan-hydrochlorothiazide (BENICAR HCT) 40-25 MG per tablet Take 1 tablet by mouth daily.    [provider]  PENNSAID 2 % SOLN APPLY TWO PUMPS TOPICALLY TO THE AFFECTED AREA AS NEEDED FOR JOINT PAIN 03/27/17   [provider]  Probiotic Product (PROBIOTIC PO) Take by mouth.    [provider]  SAVAYSA 60 MG TABS tablet Take 60 mg by mouth daily. 05/03/14   [provider]  SYMBICORT 160-4.5 MCG/ACT inhaler  03/30/17   [provider]    Family History Family History  Problem Relation Age of Onset  . Cancer Father        prostate and lung  . Diabetes Father   . Hypertension Father   . Diabetes Mother   . Hypertension Mother   . Ovarian cancer Paternal Grandmother   . Hypertension Brother        x2  . Hypothyroidism Brother   . Hypothyroidism Maternal Grandmother     Social History Social History   Tobacco Use  . Smoking status: Never Smoker  . Smokeless tobacco: Never Used  Substance Use Topics  . Alcohol use: Yes    Alcohol/week: 1.0 - 2.0 standard drinks    Types: 1 - 2 Standard drinks or equivalent per week    Comment: "periodically" 1 or twice a week a glass of wine  . Drug use: No     Allergies   Flagyl [metronidazole] and Levaquin [levofloxacin]   Review of Systems Review of Systems  Neurological: Positive for dizziness.  All other systems reviewed and are negative.    Physical Exam Updated Vital Signs BP 137/86   Pulse (!) 48   Temp (!) 97.5 F (36.4 C) (Oral)   Resp 14   LMP 08/10/2005    SpO2 99%   Physical Exam  Constitutional: She is oriented to person, place, and time. She appears well-developed and well-nourished.  HENT:  Head: Normocephalic.  MM slightly dry   Eyes: Pupils are equal, round, and reactive to light. Conjunctivae and EOM are normal.  Neck: Normal range of motion. Neck supple.  Cardiovascular: Normal rate, regular rhythm and normal heart sounds.  Pulmonary/Chest: Effort normal and breath sounds normal. No stridor. No respiratory distress.  Abdominal: Soft. Bowel sounds are normal. She exhibits no distension. There is no tenderness.  Musculoskeletal: Normal range of motion.  Neurological: She is alert and oriented to person, place, and time. No cranial nerve deficit. Coordination normal.  CN 2- 12 intact, nl strength and sensation throughout. Nl finger to nose throughout   Skin: Skin is warm.  Psychiatric: She has a normal mood and affect.  Nursing note and vitals reviewed.    ED Treatments / Results  Labs (all labs ordered are listed, but only abnormal results are displayed) Labs Reviewed  CBC WITH DIFFERENTIAL/PLATELET - Abnormal; Notable for the following components:      Result Value   Hemoglobin 11.1 (*)    All other components within normal limits  COMPREHENSIVE METABOLIC PANEL - Abnormal; Notable for the following components:   Potassium 2.9 (*)    Calcium 8.5 (*)    Total Protein 6.1 (*)    Albumin 3.4 (*)    All other components within normal limits  ETHANOL - Abnormal; Notable for the following components:   Alcohol, Ethyl (B) 134 (*)    All other components within normal limits  I-STAT TROPONIN, ED    EKG EKG Interpretation  Date/Time:  Monday June 20 2018 19:39:49 EST Ventricular Rate:  50 PR Interval:  204 QRS Duration: 110 QT Interval:  546 QTC Calculation: 497 R Axis:   31 Text Interpretation:  Sinus bradycardia Low voltage QRS Incomplete left bundle branch block Nonspecific T wave abnormality Prolonged QT  Abnormal ECG No significant change since last tracing Confirmed by Wandra Arthurs [phone]) on 06/20/2018 8:23:07 PM   Radiology No results found.  Procedures Procedures (including critical care time)  Medications Ordered in ED Medications  potassium chloride 10 mEq in 100 mL IVPB (10 mEq Intravenous New Bag/Given 06/20/18 2219)  sodium chloride 0.9 % bolus 1,000 mL (1,000 mLs Intravenous New Bag/Given 06/20/18 2112)  potassium chloride SA (K-DUR,KLOR-CON) CR tablet 40 mEq (40 mEq Oral Given 06/20/18 2213)     Initial Impression / Assessment and Plan / ED Course  I have reviewed the triage vital signs and the nursing notes.  Pertinent labs & imaging results that were available during my care of the patient were reviewed by me and considered in my medical decision making (see chart for details).    ANAIZA BEHRENS is a 52 y.o. female here with dizziness. Likely from dehydration. She has hx of afib but denies any palpitations or syncope or chest pain. Also drank alcohol today but doesn't drink alcohol chronically. Will get labs, orthostatic. Will hydrate and reassess.  11:19 PM Patient not orthostatic. ETOH 134. K 2.9. Given KCL 10 meq IV and Kdur 40 meq PO. Will dc home with several days of potassium supplementation. Recommend repeat potassium level in a week and eat and drink normally.    Final Clinical Impressions(s) / ED Diagnoses   Final diagnoses:  None    ED Discharge Orders    None       Drenda Freeze, MD 06/20/18 2322

## 2018-06-20 NOTE — Discharge Instructions (Addendum)
Stay hydrated.   Take kdur 20 meq daily for 3 days.   See your doctor in a week. Recheck potassium with your doctor  Return to ER if you have passing out, chest pain, trouble breathing.

## 2018-06-20 NOTE — ED Notes (Signed)
Patient passed fluid challenge.

## 2018-06-27 DIAGNOSIS — M1712 Unilateral primary osteoarthritis, left knee: Secondary | ICD-10-CM | POA: Diagnosis not present

## 2018-06-29 DIAGNOSIS — M1712 Unilateral primary osteoarthritis, left knee: Secondary | ICD-10-CM | POA: Diagnosis not present

## 2018-06-29 DIAGNOSIS — Z01818 Encounter for other preprocedural examination: Secondary | ICD-10-CM | POA: Diagnosis not present

## 2018-07-08 DIAGNOSIS — J208 Acute bronchitis due to other specified organisms: Secondary | ICD-10-CM | POA: Diagnosis not present

## 2018-07-11 ENCOUNTER — Ambulatory Visit
Admission: RE | Admit: 2018-07-11 | Discharge: 2018-07-11 | Disposition: A | Discharge status: Home / Self Care | Payer: BLUE CROSS/BLUE SHIELD | Source: Ambulatory Visit | Attending: Family Medicine | Admitting: Family Medicine

## 2018-07-11 ENCOUNTER — Other Ambulatory Visit: Payer: Self-pay | Admitting: Family Medicine

## 2018-07-11 DIAGNOSIS — J069 Acute upper respiratory infection, unspecified: Secondary | ICD-10-CM

## 2018-07-11 DIAGNOSIS — Z6837 Body mass index (BMI) 37.0-37.9, adult: Secondary | ICD-10-CM | POA: Diagnosis not present

## 2018-07-11 DIAGNOSIS — R05 Cough: Secondary | ICD-10-CM | POA: Diagnosis not present

## 2018-07-14 DIAGNOSIS — M1712 Unilateral primary osteoarthritis, left knee: Secondary | ICD-10-CM | POA: Diagnosis not present

## 2018-07-18 DIAGNOSIS — M1712 Unilateral primary osteoarthritis, left knee: Secondary | ICD-10-CM | POA: Diagnosis not present

## 2018-07-20 DIAGNOSIS — M1712 Unilateral primary osteoarthritis, left knee: Secondary | ICD-10-CM | POA: Diagnosis not present

## 2018-07-21 DIAGNOSIS — J069 Acute upper respiratory infection, unspecified: Secondary | ICD-10-CM | POA: Diagnosis not present

## 2018-07-21 DIAGNOSIS — Z6836 Body mass index (BMI) 36.0-36.9, adult: Secondary | ICD-10-CM | POA: Diagnosis not present

## 2018-07-25 DIAGNOSIS — M1712 Unilateral primary osteoarthritis, left knee: Secondary | ICD-10-CM | POA: Diagnosis not present

## 2018-07-27 DIAGNOSIS — M1712 Unilateral primary osteoarthritis, left knee: Secondary | ICD-10-CM | POA: Diagnosis not present

## 2018-07-31 NOTE — Telephone Encounter (Signed)
closed

## 2018-08-09 ENCOUNTER — Emergency Department (HOSPITAL_COMMUNITY): Payer: BLUE CROSS/BLUE SHIELD

## 2018-08-09 ENCOUNTER — Emergency Department (HOSPITAL_COMMUNITY)
Admission: EM | Admit: 2018-08-09 | Discharge: 2018-08-09 | Disposition: A | Discharge status: Home / Self Care | Payer: BLUE CROSS/BLUE SHIELD | Attending: Emergency Medicine | Admitting: Emergency Medicine

## 2018-08-09 ENCOUNTER — Encounter (HOSPITAL_COMMUNITY): Payer: Self-pay | Admitting: *Deleted

## 2018-08-09 DIAGNOSIS — I10 Essential (primary) hypertension: Secondary | ICD-10-CM | POA: Diagnosis not present

## 2018-08-09 DIAGNOSIS — Z79899 Other long term (current) drug therapy: Secondary | ICD-10-CM | POA: Insufficient documentation

## 2018-08-09 DIAGNOSIS — I4891 Unspecified atrial fibrillation: Secondary | ICD-10-CM | POA: Diagnosis not present

## 2018-08-09 DIAGNOSIS — R42 Dizziness and giddiness: Secondary | ICD-10-CM | POA: Diagnosis not present

## 2018-08-09 DIAGNOSIS — R001 Bradycardia, unspecified: Secondary | ICD-10-CM | POA: Diagnosis not present

## 2018-08-09 LAB — BASIC METABOLIC PANEL
Anion gap: 10 (ref 5–15)
BUN: 12 mg/dL (ref 6–20)
CHLORIDE: 103 mmol/L (ref 98–111)
CO2: 25 mmol/L (ref 22–32)
Calcium: 9.1 mg/dL (ref 8.9–10.3)
Creatinine, Ser: 0.83 mg/dL (ref 0.44–1.00)
GFR calc Af Amer: >60 mL/min (ref >60)
GFR calc non Af Amer: >60 mL/min (ref >60)
Glucose, Bld: 107 mg/dL — ABNORMAL HIGH (ref 70–99)
Potassium: 3.8 mmol/L (ref 3.5–5.1)
Sodium: 138 mmol/L (ref 135–145)

## 2018-08-09 LAB — CBC
HCT: 38.2 % (ref 36.0–46.0)
Hemoglobin: 12.3 g/dL (ref 12.0–15.0)
MCH: 29.3 pg (ref 26.0–34.0)
MCHC: 32.2 g/dL (ref 30.0–36.0)
MCV: 91 fL (ref 80.0–100.0)
Platelets: 418 10*3/uL — ABNORMAL HIGH (ref 150–400)
RBC: 4.2 MIL/uL (ref 3.87–5.11)
RDW: 13.4 % (ref 11.5–15.5)
WBC: 5.9 10*3/uL (ref 4.0–10.5)
nRBC: 0 % (ref 0.0–0.2)

## 2018-08-09 LAB — I-STAT BETA HCG BLOOD, ED (MC, WL, AP ONLY): I-stat hCG, quantitative: <5 m[IU]/mL (ref <5)

## 2018-08-09 MED ORDER — SODIUM CHLORIDE 0.9 % IV BOLUS (SEPSIS)
1000.0000 mL | Freq: Once | INTRAVENOUS | Status: AC
  Administered 2018-08-09: 1000 mL via INTRAVENOUS

## 2018-08-09 MED ORDER — ONDANSETRON HCL 4 MG/2ML IJ SOLN
4.0000 mg | Freq: Once | INTRAMUSCULAR | Status: AC
  Administered 2018-08-09: 4 mg via INTRAVENOUS
  Filled 2018-08-09: qty 2

## 2018-08-09 MED ORDER — MECLIZINE HCL 25 MG PO TABS: 25.0000 mg | ORAL_TABLET | Freq: Three times a day (TID) | ORAL | 0 refills | Status: DC | PRN

## 2018-08-09 MED ORDER — DIAZEPAM 5 MG/ML IJ SOLN
5.0000 mg | Freq: Once | INTRAMUSCULAR | Status: AC
  Administered 2018-08-09: 5 mg via INTRAVENOUS
  Filled 2018-08-09: qty 2

## 2018-08-09 MED ORDER — MECLIZINE HCL 25 MG PO TABS
50.0000 mg | ORAL_TABLET | Freq: Once | ORAL | Status: AC
  Administered 2018-08-09: 50 mg via ORAL
  Filled 2018-08-09: qty 2

## 2018-08-09 NOTE — ED Provider Notes (Signed)
Blood pressure (!) 146/78, pulse (!) 48, temperature 97.8 F (36.6 C), temperature source Oral, resp. rate 17, height 5\' 9"  (1.753 m), weight 118.4 kg, last menstrual period 08/10/2005, SpO2 100 %.  Assuming care from Dr. Leonides Schanz.  In short, [name] is a 52 y.o. female with a chief complaint of Dizziness .  Refer to the original H&P for additional details.  F/u MRI is negative for acute central process. Reassessed patient who is feeling better. She is ambulating in the ED and feeling comfortable with plan for discharge. Provided contact information for ENT. Discussed ED return precautions.     Margette Fast, MD 08/09/18 [phone]

## 2018-08-09 NOTE — ED Notes (Signed)
Walked patient to the bathroom patient stated that she felt a little dizzy when she first sat up but did well walking with pulse oxy it went down to 92 room air but went back up to 100 on room air patient is now back in bed with family at bedside and call bell in reach

## 2018-08-09 NOTE — ED Notes (Signed)
Pt ambulated in [place], Pt felt dizzy upon standing. Pt stated she felt fine after ambulating

## 2018-08-09 NOTE — ED Triage Notes (Signed)
To ED for eval of dizziness for the past 24 hrs. States she woke yesterday morning feeling dizzy which has become worse over the past 24 hrs. States she did take her bp meds early yesterday thinking this was due to her HTN. No HA. No vomiting. Hx of afib and htn. No further neuro deficits noted.

## 2018-08-09 NOTE — ED Provider Notes (Signed)
TIME SEEN: 4:43 AM  CHIEF COMPLAINT: Vertigo  HPI: Patient is a 52 year old female with history of paroxysmal atrial fibrillation on Eliquis, hypertension who presents to the emergency department with vertigo.  Symptoms started yesterday at 4 AM when she rolled over onto her right side in bed.  She felt like the room was moving and spinning.  She has never had similar symptoms before.  Symptoms are better when she is staying still but worse when she is lying flat.  She has been able to ambulate but states she has dizziness with doing so.  No chest pain or shortness of breath.  No headache, head injury, tinnitus, hearing loss, ear pain, fever, numbness, tingling, focal weakness.  No history of stroke or TIA.  ROS: See HPI Constitutional: no fever  Eyes: no drainage  ENT: no runny nose   Cardiovascular:  no chest pain  Resp: no SOB  GI: no vomiting GU: no dysuria Integumentary: no rash  Allergy: no hives  Musculoskeletal: no leg swelling  Neurological: no slurred speech ROS otherwise negative  PAST MEDICAL HISTORY/PAST SURGICAL HISTORY:  Past Medical History:  Diagnosis Date  . Adenomyosis 7/07  . Anemia   . Arthritis   . Atrial fibrillation ([place])   . Bleeding from the nose   . Bronchitis   . Dysrhythmia   . GERD (gastroesophageal reflux disease)   . H/O blood clots    superficial blood clots with OCPs  . Hypertension   . Obesity   . Sinus congestion    saw ENT, had nasal surgery to clean out nasal area for bacteria, mold  . Uterine fibroid   . Vitamin D deficiency     MEDICATIONS:  Prior to Admission medications   Medication Sig Start Date End Date Taking? Authorizing Provider  albuterol (PROVENTIL) (2.5 MG/3ML) 0.083% nebulizer solution Take 3 mLs (2.5 mg total) by nebulization every 2 (two) hours as needed for wheezing. 10/22/14   Thurnell Lose, MD  ALPRAZolam Duanne Moron) 0.5 MG tablet  01/16/17   [provider]  azelastine (ASTELIN) 0.1 % nasal spray  02/23/17    [provider]  CARTIA XT 180 MG 24 hr capsule Take 180 mg by mouth daily. 05/03/14   [provider]  Cholecalciferol (VITAMIN D3 PO) Take 6,000 Int'l Units by mouth daily.    [provider]  DEXILANT 60 MG capsule Take 60 mg by mouth as needed.  07/31/13   [provider]  docusate sodium (COLACE) 100 MG capsule Take 1 capsule (100 mg total) by mouth 2 (two) times daily. 06/26/16   Prudencio Burly III, PA-C  escitalopram (LEXAPRO) 20 MG tablet Take 20 mg by mouth daily.    [provider]  flecainide (TAMBOCOR) 150 MG tablet Take 150 mg by mouth 2 (two) times daily.    [provider]  gabapentin (NEURONTIN) 300 MG capsule 1 po nightly. 04/27/17   Megan Salon, MD  IRON PO Take by mouth.    [provider]  loratadine (CLARITIN) 10 MG tablet Take 10 mg by mouth daily.    [provider]  metoprolol succinate (TOPROL-XL) 50 MG 24 hr tablet Take 50 mg by mouth daily. Take with or immediately following a meal.    [provider]  montelukast (SINGULAIR) 10 MG tablet  02/23/17   [provider]  Multiple Vitamins-Minerals (MULTIVITAMIN WITH MINERALS) tablet Take by mouth.    [provider]  olmesartan-hydrochlorothiazide (BENICAR HCT) 40-25 MG per tablet  Take 1 tablet by mouth daily.    [provider]  PENNSAID 2 % SOLN APPLY TWO PUMPS TOPICALLY TO THE AFFECTED AREA AS NEEDED FOR JOINT PAIN 03/27/17   [provider]  potassium chloride 20 MEQ TBCR Take 10 mEq by mouth daily. 06/20/18   Drenda Freeze, MD  Probiotic Product (PROBIOTIC PO) Take by mouth.    [provider]  SAVAYSA 60 MG TABS tablet Take 60 mg by mouth daily. 05/03/14   [provider]  SYMBICORT 160-4.5 MCG/ACT inhaler  03/30/17   [provider]    ALLERGIES:  Allergies  Allergen Reactions  . Flagyl [Metronidazole] Anaphylaxis  . Levaquin [Levofloxacin] Other (See  Comments)    Pains and aches throughout body, pt states cant move well    SOCIAL HISTORY:  Social History   Tobacco Use  . Smoking status: Never Smoker  . Smokeless tobacco: Never Used  Substance Use Topics  . Alcohol use: Yes    Alcohol/week: 1.0 - 2.0 standard drinks    Types: 1 - 2 Standard drinks or equivalent per week    Comment: "periodically" 1 or twice a week a glass of wine    FAMILY HISTORY: Family History  Problem Relation Age of Onset  . Cancer Father        prostate and lung  . Diabetes Father   . Hypertension Father   . Diabetes Mother   . Hypertension Mother   . Ovarian cancer Paternal Grandmother   . Hypertension Brother        x2  . Hypothyroidism Brother   . Hypothyroidism Maternal Grandmother     EXAM: BP 138/86   Pulse (!) 51   Temp 97.8 F (36.6 C) (Oral)   Resp 17   Ht 5\' 9"  (1.753 m)   Wt 118.4 kg   LMP 08/10/2005   SpO2 99%   BMI 38.54 kg/m  CONSTITUTIONAL: Alert and oriented and responds appropriately to questions. Well-appearing; well-nourished HEAD: Normocephalic EYES: Conjunctivae clear, pupils appear equal, EOMI ENT: normal nose; moist mucous membranes NECK: Supple, no meningismus, no nuchal rigidity, no LAD  CARD: RRR; S1 and S2 appreciated; no murmurs, no clicks, no rubs, no gallops RESP: Normal chest excursion without splinting or tachypnea; breath sounds clear and equal bilaterally; no wheezes, no rhonchi, no rales, no hypoxia or respiratory distress, speaking full sentences ABD/GI: Normal bowel sounds; non-distended; soft, non-tender, no rebound, no guarding, no peritoneal signs, no hepatosplenomegaly BACK:  The back appears normal and is non-tender to palpation, there is no CVA tenderness EXT: Normal ROM in all joints; non-tender to palpation; no edema; normal capillary refill; no cyanosis, no calf tenderness or swelling    SKIN: Normal color for age and race; warm; no rash NEURO: Moves all extremities equally; Strength 5/5 in  all four extremities.  Normal sensation diffusely.  CN 2-12 grossly intact.  No dysmetria to finger to nose testing bilaterally.  Normal speech.   PSYCH: The patient's mood and manner are appropriate. Grooming and personal hygiene are appropriate.  MEDICAL DECISION MAKING: Patient here with symptoms of peripheral vertigo.  Symptoms worse with lying flat and movement but gone when staying still.  She does have some risk factors for stroke but have very low suspicion for central cause of vertigo.  She has no dysmetria currently and no consistent symptoms.  No nystagmus on exam.  She is neurologically intact.  Plan is to treat symptomatically with meclizine, IV fluids, Zofran.  Patient is comfortable  with this plan.  I do not feel she needs brain imaging at this time unless symptoms are difficult to control.  Patient agrees with this plan.  We discussed that given her stroke scale of 0 and that symptoms have been present for 24 hours she would not be a TPA candidate.  ED PROGRESS: 6:05 AM  Pt reports that she felt somewhat improved after meclizine, IV fluids and Zofran but still symptomatic with lying flat and unable to ambulate.  Will obtain MRI of her brain to rule out central cause of her vertigo and give IV Valium.  Her labs today are unremarkable.  7:35 AM  Signed out to Dr. Laverta Baltimore to reassess patient and follow up on MRI brain.   I reviewed all nursing notes, vitals, pertinent previous records, EKGs, lab and urine results, imaging (as available).    EKG Interpretation  Date/Time:  Tuesday August 09 2018 04:04:51 EST Ventricular Rate:  56 PR Interval:  170 QRS Duration: 102 QT Interval:  488 QTC Calculation: 470 R Axis:   63 Text Interpretation:  Sinus bradycardia Otherwise normal ECG No significant change since last tracing Confirmed by Levelle Edelen, Cyril Mourning [phone]) on 08/09/2018 4:13:00 AM         Eudell Julian, Delice Bison, DO 08/09/18 6045

## 2018-08-09 NOTE — Discharge Instructions (Signed)
We believe your symptoms were caused by benign vertigo.  Please read through the included information and take any prescribed medication(s).  Follow up with your doctor as listed above.  If you develop any new or worsening symptoms that concern you, including but not limited to persistent dizziness/vertigo, numbness or weakness in your arms or legs, altered mental status, persistent vomiting, or fever greater than 101, please return immediately to the Emergency Department.  

## 2018-08-12 DIAGNOSIS — M1712 Unilateral primary osteoarthritis, left knee: Secondary | ICD-10-CM | POA: Diagnosis not present

## 2018-08-15 DIAGNOSIS — E860 Dehydration: Secondary | ICD-10-CM | POA: Diagnosis not present

## 2018-08-15 DIAGNOSIS — I10 Essential (primary) hypertension: Secondary | ICD-10-CM | POA: Diagnosis not present

## 2018-08-15 DIAGNOSIS — F320 Major depressive disorder, single episode, mild: Secondary | ICD-10-CM | POA: Diagnosis not present

## 2018-08-15 DIAGNOSIS — M1712 Unilateral primary osteoarthritis, left knee: Secondary | ICD-10-CM | POA: Diagnosis not present

## 2018-08-15 DIAGNOSIS — H8113 Benign paroxysmal vertigo, bilateral: Secondary | ICD-10-CM | POA: Diagnosis not present

## 2018-08-17 DIAGNOSIS — M1712 Unilateral primary osteoarthritis, left knee: Secondary | ICD-10-CM | POA: Diagnosis not present

## 2018-08-19 DIAGNOSIS — I10 Essential (primary) hypertension: Secondary | ICD-10-CM | POA: Diagnosis not present

## 2018-08-19 DIAGNOSIS — R42 Dizziness and giddiness: Secondary | ICD-10-CM | POA: Diagnosis not present

## 2018-08-19 DIAGNOSIS — Z6839 Body mass index (BMI) 39.0-39.9, adult: Secondary | ICD-10-CM | POA: Diagnosis not present

## 2018-08-19 DIAGNOSIS — E860 Dehydration: Secondary | ICD-10-CM | POA: Diagnosis not present

## 2018-08-22 DIAGNOSIS — M1712 Unilateral primary osteoarthritis, left knee: Secondary | ICD-10-CM | POA: Diagnosis not present

## 2018-08-23 DIAGNOSIS — M1712 Unilateral primary osteoarthritis, left knee: Secondary | ICD-10-CM | POA: Diagnosis not present

## 2018-08-31 DIAGNOSIS — M1712 Unilateral primary osteoarthritis, left knee: Secondary | ICD-10-CM | POA: Diagnosis not present

## 2018-09-02 DIAGNOSIS — J3089 Other allergic rhinitis: Secondary | ICD-10-CM | POA: Diagnosis not present

## 2018-09-02 DIAGNOSIS — R42 Dizziness and giddiness: Secondary | ICD-10-CM | POA: Diagnosis not present

## 2018-09-02 DIAGNOSIS — E860 Dehydration: Secondary | ICD-10-CM | POA: Diagnosis not present

## 2018-09-02 DIAGNOSIS — I10 Essential (primary) hypertension: Secondary | ICD-10-CM | POA: Diagnosis not present

## 2018-09-07 DIAGNOSIS — M1712 Unilateral primary osteoarthritis, left knee: Secondary | ICD-10-CM | POA: Diagnosis not present

## 2018-09-14 ENCOUNTER — Other Ambulatory Visit: Payer: Self-pay | Admitting: Family Medicine

## 2018-09-14 DIAGNOSIS — K219 Gastro-esophageal reflux disease without esophagitis: Secondary | ICD-10-CM | POA: Diagnosis not present

## 2018-09-14 DIAGNOSIS — R1013 Epigastric pain: Secondary | ICD-10-CM

## 2018-09-14 DIAGNOSIS — I10 Essential (primary) hypertension: Secondary | ICD-10-CM | POA: Diagnosis not present

## 2018-09-14 DIAGNOSIS — Z6841 Body Mass Index (BMI) 40.0 and over, adult: Secondary | ICD-10-CM | POA: Diagnosis not present

## 2018-09-14 DIAGNOSIS — R109 Unspecified abdominal pain: Secondary | ICD-10-CM | POA: Diagnosis not present

## 2018-09-16 ENCOUNTER — Inpatient Hospital Stay (HOSPITAL_COMMUNITY)
Admission: EM | Admit: 2018-09-16 | Discharge: 2018-09-22 | DRG: 337 | Disposition: A | Discharge status: Home / Self Care | Payer: BLUE CROSS/BLUE SHIELD | Attending: General Surgery | Admitting: General Surgery

## 2018-09-16 ENCOUNTER — Encounter (HOSPITAL_COMMUNITY): Payer: Self-pay

## 2018-09-16 ENCOUNTER — Ambulatory Visit
Admission: RE | Admit: 2018-09-16 | Discharge: 2018-09-16 | Disposition: A | Discharge status: Home / Self Care | Payer: BLUE CROSS/BLUE SHIELD | Source: Ambulatory Visit | Attending: Family Medicine | Admitting: Family Medicine

## 2018-09-16 ENCOUNTER — Emergency Department (HOSPITAL_COMMUNITY): Payer: BLUE CROSS/BLUE SHIELD

## 2018-09-16 DIAGNOSIS — M1990 Unspecified osteoarthritis, unspecified site: Secondary | ICD-10-CM | POA: Diagnosis present

## 2018-09-16 DIAGNOSIS — F329 Major depressive disorder, single episode, unspecified: Secondary | ICD-10-CM | POA: Diagnosis not present

## 2018-09-16 DIAGNOSIS — Z9884 Bariatric surgery status: Secondary | ICD-10-CM | POA: Diagnosis not present

## 2018-09-16 DIAGNOSIS — Z7901 Long term (current) use of anticoagulants: Secondary | ICD-10-CM

## 2018-09-16 DIAGNOSIS — Z881 Allergy status to other antibiotic agents status: Secondary | ICD-10-CM | POA: Diagnosis not present

## 2018-09-16 DIAGNOSIS — K219 Gastro-esophageal reflux disease without esophagitis: Secondary | ICD-10-CM | POA: Diagnosis not present

## 2018-09-16 DIAGNOSIS — R1011 Right upper quadrant pain: Secondary | ICD-10-CM | POA: Diagnosis not present

## 2018-09-16 DIAGNOSIS — I4891 Unspecified atrial fibrillation: Secondary | ICD-10-CM | POA: Diagnosis present

## 2018-09-16 DIAGNOSIS — N281 Cyst of kidney, acquired: Secondary | ICD-10-CM | POA: Diagnosis not present

## 2018-09-16 DIAGNOSIS — G4730 Sleep apnea, unspecified: Secondary | ICD-10-CM | POA: Diagnosis not present

## 2018-09-16 DIAGNOSIS — R1013 Epigastric pain: Secondary | ICD-10-CM

## 2018-09-16 DIAGNOSIS — R109 Unspecified abdominal pain: Secondary | ICD-10-CM | POA: Diagnosis present

## 2018-09-16 DIAGNOSIS — Z96651 Presence of right artificial knee joint: Secondary | ICD-10-CM | POA: Diagnosis not present

## 2018-09-16 DIAGNOSIS — K660 Peritoneal adhesions (postprocedural) (postinfection): Secondary | ICD-10-CM | POA: Diagnosis not present

## 2018-09-16 DIAGNOSIS — Z8249 Family history of ischemic heart disease and other diseases of the circulatory system: Secondary | ICD-10-CM | POA: Diagnosis not present

## 2018-09-16 DIAGNOSIS — K838 Other specified diseases of biliary tract: Secondary | ICD-10-CM

## 2018-09-16 DIAGNOSIS — K5651 Intestinal adhesions [bands], with partial obstruction: Secondary | ICD-10-CM | POA: Diagnosis not present

## 2018-09-16 DIAGNOSIS — Z8041 Family history of malignant neoplasm of ovary: Secondary | ICD-10-CM

## 2018-09-16 DIAGNOSIS — Z833 Family history of diabetes mellitus: Secondary | ICD-10-CM | POA: Diagnosis not present

## 2018-09-16 DIAGNOSIS — I10 Essential (primary) hypertension: Secondary | ICD-10-CM | POA: Diagnosis not present

## 2018-09-16 DIAGNOSIS — F419 Anxiety disorder, unspecified: Secondary | ICD-10-CM | POA: Diagnosis present

## 2018-09-16 DIAGNOSIS — K9589 Other complications of other bariatric procedure: Secondary | ICD-10-CM | POA: Diagnosis not present

## 2018-09-16 DIAGNOSIS — R0781 Pleurodynia: Secondary | ICD-10-CM | POA: Diagnosis not present

## 2018-09-16 LAB — COMPREHENSIVE METABOLIC PANEL
ALT: 16 U/L (ref 0–44)
AST: 20 U/L (ref 15–41)
Albumin: 3.9 g/dL (ref 3.5–5.0)
Alkaline Phosphatase: 108 U/L (ref 38–126)
Anion gap: 15 (ref 5–15)
BUN: 8 mg/dL (ref 6–20)
CHLORIDE: 101 mmol/L (ref 98–111)
CO2: 26 mmol/L (ref 22–32)
Calcium: 9.4 mg/dL (ref 8.9–10.3)
Creatinine, Ser: 0.84 mg/dL (ref 0.44–1.00)
GFR calc Af Amer: >60 mL/min (ref >60)
GFR calc non Af Amer: >60 mL/min (ref >60)
Glucose, Bld: 135 mg/dL — ABNORMAL HIGH (ref 70–99)
POTASSIUM: 3.8 mmol/L (ref 3.5–5.1)
SODIUM: 142 mmol/L (ref 135–145)
Total Bilirubin: 0.2 mg/dL — ABNORMAL LOW (ref 0.3–1.2)
Total Protein: 7.4 g/dL (ref 6.5–8.1)

## 2018-09-16 LAB — URINALYSIS, ROUTINE W REFLEX MICROSCOPIC
BILIRUBIN URINE: NEGATIVE
Glucose, UA: NEGATIVE mg/dL
Ketones, ur: NEGATIVE mg/dL
Leukocytes, UA: NEGATIVE
Nitrite: NEGATIVE
Protein, ur: NEGATIVE mg/dL
Specific Gravity, Urine: >1.046 — ABNORMAL HIGH (ref 1.005–1.030)
pH: 5 (ref 5.0–8.0)

## 2018-09-16 LAB — CBC WITH DIFFERENTIAL/PLATELET
Abs Immature Granulocytes: 0.01 10*3/uL (ref 0.00–0.07)
BASOS ABS: 0.1 10*3/uL (ref 0.0–0.1)
BASOS PCT: 1 %
Eosinophils Absolute: 0.1 10*3/uL (ref 0.0–0.5)
Eosinophils Relative: 2 %
HCT: 38.4 % (ref 36.0–46.0)
Hemoglobin: 12 g/dL (ref 12.0–15.0)
Immature Granulocytes: 0 %
Lymphocytes Relative: 21 %
Lymphs Abs: 1.3 10*3/uL (ref 0.7–4.0)
MCH: 28 pg (ref 26.0–34.0)
MCHC: 31.3 g/dL (ref 30.0–36.0)
MCV: 89.7 fL (ref 80.0–100.0)
Monocytes Absolute: 0.5 10*3/uL (ref 0.1–1.0)
Monocytes Relative: 7 %
NEUTROS ABS: 4.2 10*3/uL (ref 1.7–7.7)
Neutrophils Relative %: 69 %
PLATELETS: 292 10*3/uL (ref 150–400)
RBC: 4.28 MIL/uL (ref 3.87–5.11)
RDW: 13.2 % (ref 11.5–15.5)
WBC: 6.1 10*3/uL (ref 4.0–10.5)
nRBC: 0 % (ref 0.0–0.2)

## 2018-09-16 LAB — LIPASE, BLOOD: Lipase: 25 U/L (ref 11–51)

## 2018-09-16 MED ORDER — OLMESARTAN MEDOXOMIL-HCTZ 40-25 MG PO TABS: 1.0000 | ORAL_TABLET | Freq: Every day | ORAL | Status: DC

## 2018-09-16 MED ORDER — ONDANSETRON HCL 4 MG/2ML IJ SOLN
4.0000 mg | Freq: Once | INTRAMUSCULAR | Status: AC
  Administered 2018-09-16: 4 mg via INTRAVENOUS
  Filled 2018-09-16: qty 2

## 2018-09-16 MED ORDER — SODIUM CHLORIDE 0.9 % IV SOLN
INTRAVENOUS | Status: DC
  Administered 2018-09-16 – 2018-09-21 (×7): via INTRAVENOUS

## 2018-09-16 MED ORDER — LORATADINE 10 MG PO TABS
10.0000 mg | ORAL_TABLET | Freq: Every day | ORAL | Status: DC
  Administered 2018-09-17 – 2018-09-22 (×6): 10 mg via ORAL
  Filled 2018-09-16 (×6): qty 1

## 2018-09-16 MED ORDER — GABAPENTIN 300 MG PO CAPS
300.0000 mg | ORAL_CAPSULE | Freq: Every day | ORAL | Status: DC
  Administered 2018-09-16 – 2018-09-21 (×6): 300 mg via ORAL
  Filled 2018-09-16 (×5): qty 1

## 2018-09-16 MED ORDER — MORPHINE SULFATE (PF) 2 MG/ML IV SOLN
1.0000 mg | INTRAVENOUS | Status: DC | PRN
  Administered 2018-09-17: 1 mg via INTRAVENOUS
  Administered 2018-09-17 – 2018-09-18 (×7): 2 mg via INTRAVENOUS
  Filled 2018-09-16 (×8): qty 1

## 2018-09-16 MED ORDER — ACETAMINOPHEN 10 MG/ML IV SOLN
1000.0000 mg | Freq: Four times a day (QID) | INTRAVENOUS | Status: AC
  Administered 2018-09-16 – 2018-09-17 (×4): 1000 mg via INTRAVENOUS
  Filled 2018-09-16 (×4): qty 100

## 2018-09-16 MED ORDER — METOPROLOL TARTRATE 5 MG/5ML IV SOLN
5.0000 mg | Freq: Four times a day (QID) | INTRAVENOUS | Status: DC | PRN
  Administered 2018-09-17: 5 mg via INTRAVENOUS
  Administered 2018-09-21: 2.5 mg via INTRAVENOUS
  Filled 2018-09-16 (×2): qty 5

## 2018-09-16 MED ORDER — METOPROLOL SUCCINATE ER 50 MG PO TB24
50.0000 mg | ORAL_TABLET | Freq: Every day | ORAL | Status: DC
  Administered 2018-09-17 – 2018-09-22 (×6): 50 mg via ORAL
  Filled 2018-09-16: qty 2
  Filled 2018-09-16: qty 1
  Filled 2018-09-16 (×3): qty 2
  Filled 2018-09-16: qty 1

## 2018-09-16 MED ORDER — SODIUM CHLORIDE 0.9 % IV BOLUS
1000.0000 mL | Freq: Once | INTRAVENOUS | Status: AC
  Administered 2018-09-16: 1000 mL via INTRAVENOUS

## 2018-09-16 MED ORDER — ESCITALOPRAM OXALATE 10 MG PO TABS
20.0000 mg | ORAL_TABLET | Freq: Every day | ORAL | Status: DC
  Administered 2018-09-17 – 2018-09-22 (×6): 20 mg via ORAL
  Filled 2018-09-16 (×6): qty 2

## 2018-09-16 MED ORDER — HYDROMORPHONE HCL 1 MG/ML IJ SOLN
1.0000 mg | Freq: Once | INTRAMUSCULAR | Status: AC
  Administered 2018-09-16: 1 mg via INTRAVENOUS
  Filled 2018-09-16: qty 1

## 2018-09-16 MED ORDER — PANTOPRAZOLE SODIUM 40 MG IV SOLR
40.0000 mg | Freq: Two times a day (BID) | INTRAVENOUS | Status: DC
  Administered 2018-09-16 – 2018-09-22 (×12): 40 mg via INTRAVENOUS
  Filled 2018-09-16 (×12): qty 40

## 2018-09-16 MED ORDER — ONDANSETRON 4 MG PO TBDP
4.0000 mg | ORAL_TABLET | Freq: Four times a day (QID) | ORAL | Status: DC | PRN
  Administered 2018-09-18: 4 mg via ORAL
  Filled 2018-09-16: qty 1

## 2018-09-16 MED ORDER — FLECAINIDE ACETATE 50 MG PO TABS
150.0000 mg | ORAL_TABLET | Freq: Two times a day (BID) | ORAL | Status: DC
  Administered 2018-09-17 – 2018-09-22 (×12): 150 mg via ORAL
  Filled 2018-09-16 (×13): qty 1

## 2018-09-16 MED ORDER — DILTIAZEM HCL ER COATED BEADS 180 MG PO CP24
180.0000 mg | ORAL_CAPSULE | Freq: Every day | ORAL | Status: DC
  Administered 2018-09-17 – 2018-09-22 (×6): 180 mg via ORAL
  Filled 2018-09-16 (×6): qty 1

## 2018-09-16 MED ORDER — ALBUTEROL SULFATE (2.5 MG/3ML) 0.083% IN NEBU: 2.5000 mg | INHALATION_SOLUTION | RESPIRATORY_TRACT | Status: DC | PRN

## 2018-09-16 MED ORDER — IRBESARTAN 300 MG PO TABS
300.0000 mg | ORAL_TABLET | Freq: Every day | ORAL | Status: DC
  Administered 2018-09-17 – 2018-09-22 (×6): 300 mg via ORAL
  Filled 2018-09-16 (×6): qty 1

## 2018-09-16 MED ORDER — IOHEXOL 300 MG/ML  SOLN
100.0000 mL | Freq: Once | INTRAMUSCULAR | Status: AC | PRN
  Administered 2018-09-16: 100 mL via INTRAVENOUS

## 2018-09-16 MED ORDER — ENOXAPARIN SODIUM 40 MG/0.4ML ~~LOC~~ SOLN
40.0000 mg | SUBCUTANEOUS | Status: DC
  Administered 2018-09-17 – 2018-09-20 (×4): 40 mg via SUBCUTANEOUS
  Filled 2018-09-16 (×4): qty 0.4

## 2018-09-16 MED ORDER — ONDANSETRON HCL 4 MG/2ML IJ SOLN
4.0000 mg | Freq: Four times a day (QID) | INTRAMUSCULAR | Status: DC | PRN
  Administered 2018-09-17 (×2): 4 mg via INTRAVENOUS
  Filled 2018-09-16 (×2): qty 2

## 2018-09-16 NOTE — H&P (Signed)
[name] is an 53 y.o. female.   Chief Complaint: abd pain HPI: 26 yof who has history of band converted to bypass at [place] find operative details.  She has lost some weight.  On Monday she developed ruq and epigastric pain that has been present since then. Some nausea, no emesis. Passing flatus and having bms.  No urinary symptoms.  Pain worse with eating.  It hasnt really changed since Monday. She saw pcp and underwent ruq [place] today.   This showed intra and extra hepatic bile duct as well as panc duct dilatation concerning for obstruction. She was referred to the er.  lfts and lipase are normal. She then underwent ct scan.  I have discussed this with radiology and reviewed it.  She does not have obstruction or a concerning swirling I can see.  There is some soft tissue stranding in small bowel mesentery however.  There is kidney cyst.  I was called to see her.  Past Medical History:  Diagnosis Date  . Adenomyosis 7/07  . Anemia   . Arthritis   . Atrial fibrillation ([place])   . Bleeding from the nose   . Bronchitis   . Dysrhythmia   . GERD (gastroesophageal reflux disease)   . H/O blood clots    superficial blood clots with OCPs  . Hypertension   . Obesity   . Sinus congestion    saw ENT, had nasal surgery to clean out nasal area for bacteria, mold  . Uterine fibroid   . Vitamin D deficiency     Past Surgical History:  Procedure Laterality Date  . ABDOMINAL HYSTERECTOMY    . BREAST BIOPSY     x2  . CESAREAN SECTION    . ESOPHAGOGASTRODUODENOSCOPY N/A 06/15/2016   Procedure: ESOPHAGOGASTRODUODENOSCOPY (EGD);  Surgeon: Alphonsa Overall, MD;  Location: [facility];  Service: General;  Laterality: N/A;  . LAPAROSCOPIC GASTRIC BANDING  2010  . LAPAROSCOPY     x2  . MOUTH SURGERY    . NASAL SINUS SURGERY  4/13  . PARTIAL KNEE ARTHROPLASTY Right 06/26/2016   Procedure: RIGHT UNICOMPARTMENTAL KNEE CODYLE AND PLATEAU MEDIAL COMPARTMENT;  Surgeon: Renette Butters, MD;   Location: [facility];  Service: Orthopedics;  Laterality: Right;    Family History  Problem Relation Age of Onset  . Cancer Father        prostate and lung  . Diabetes Father   . Hypertension Father   . Diabetes Mother   . Hypertension Mother   . Ovarian cancer Paternal Grandmother   . Hypertension Brother        x2  . Hypothyroidism Brother   . Hypothyroidism Maternal Grandmother    Social History:  reports that she has never smoked. She has never used smokeless tobacco. She reports current alcohol use of about 1.0 - 2.0 standard drinks of alcohol per week. She reports that she does not use drugs.  Allergies:  Allergies  Allergen Reactions  . Flagyl [Metronidazole] Anaphylaxis  . Levaquin [Levofloxacin] Other (See Comments)    Pains and aches throughout body, pt states cant move well   meds reviewed, she took eliquis this am  Cardiologist is [place]  Results for orders placed or performed during the hospital encounter of 09/16/18 (from the past 48 hour(s))  Comprehensive metabolic panel     Status: Abnormal   Collection Time: 09/16/18  3:34 PM  Result Value Ref Range   Sodium 142 135 - 145 mmol/L  Potassium 3.8 3.5 - 5.1 mmol/L   Chloride 101 98 - 111 mmol/L   CO2 26 22 - 32 mmol/L   Glucose, Bld 135 (H) 70 - 99 mg/dL   BUN 8 6 - 20 mg/dL   Creatinine, Ser 0.84 0.44 - 1.00 mg/dL   Calcium 9.4 8.9 - 10.3 mg/dL   Total Protein 7.4 6.5 - 8.1 g/dL   Albumin 3.9 3.5 - 5.0 g/dL   AST 20 15 - 41 U/L   ALT 16 0 - 44 U/L   Alkaline Phosphatase 108 38 - 126 U/L   Total Bilirubin 0.2 (L) 0.3 - 1.2 mg/dL   GFR calc non Af Amer >60 >60 mL/min   GFR calc Af Amer >60 >60 mL/min   Anion gap 15 5 - 15    Comment: Performed at [facility] [address] [place], [place] [address]  Lipase, blood     Status: None   Collection Time: 09/16/18  3:34 PM  Result Value Ref Range   Lipase 25 11 - 51 U/L    Comment: Performed at [facility] [address] [place], [place] [address]  CBC with Differential     Status: None   Collection Time: 09/16/18  3:34 PM  Result Value Ref Range   WBC 6.1 4.0 - 10.5 K/uL   RBC 4.28 3.87 - 5.11 MIL/uL   Hemoglobin 12.0 12.0 - 15.0 g/dL   HCT 38.4 36.0 - 46.0 %   MCV 89.7 80.0 - 100.0 fL   MCH 28.0 26.0 - 34.0 pg   MCHC 31.3 30.0 - 36.0 g/dL   RDW 13.2 11.5 - 15.5 %   Platelets 292 150 - 400 K/uL   nRBC 0.0 0.0 - 0.2 %   Neutrophils Relative % 69 %   Neutro Abs 4.2 1.7 - 7.7 K/uL   Lymphocytes Relative 21 %   Lymphs Abs 1.3 0.7 - 4.0 K/uL   Monocytes Relative 7 %   Monocytes Absolute 0.5 0.1 - 1.0 K/uL   Eosinophils Relative 2 %   Eosinophils Absolute 0.1 0.0 - 0.5 K/uL   Basophils Relative 1 %   Basophils Absolute 0.1 0.0 - 0.1 K/uL   Immature Granulocytes 0 %   Abs Immature Granulocytes 0.01 0.00 - 0.07 K/uL    Comment: Performed at [facility], [address], [place], [place] [address]  Urinalysis, Routine w reflex microscopic     Status: Abnormal   Collection Time: 09/16/18  8:19 PM  Result Value Ref Range   Color, Urine YELLOW YELLOW   APPearance CLEAR CLEAR   Specific Gravity, Urine >1.046 (H) 1.005 - 1.030   pH 5.0 5.0 - 8.0   Glucose, UA NEGATIVE NEGATIVE mg/dL   Hgb urine dipstick MODERATE (A) NEGATIVE   Bilirubin Urine NEGATIVE NEGATIVE   Ketones, ur NEGATIVE NEGATIVE mg/dL   Protein, ur NEGATIVE NEGATIVE mg/dL   Nitrite NEGATIVE NEGATIVE   Leukocytes, UA NEGATIVE NEGATIVE   RBC / HPF 6-10 0 - 5 RBC/hpf   WBC, UA 0-5 0 - 5 WBC/hpf   Bacteria, UA FEW (A) NONE SEEN   Squamous Epithelial / LPF 0-5 0 - 5    Comment: Performed at [facility], [address], [place],  [address]   US Abdomen Complete  Result Date: 09/16/2018 CLINICAL DATA:  Epigastric pain after medications. EXAM: ABDOMEN ULTRASOUND COMPLETE COMPARISON:  05/25/2014 FINDINGS: Gallbladder: No gallstones or wall thickening visualized. No sonographic Percell Miller  sign noted by sonographer. Common bile  duct: Diameter: 12 mm. Liver: No focal lesion identified. Within normal limits in parenchymal echogenicity. Portal vein is patent on color Doppler imaging with normal direction of blood flow towards the liver. Intrahepatic bile duct dilatation IVC: No abnormality visualized. Pancreas: Dilated main pancreatic duct at 3 mm. No mass or inflammation is seen. Spleen: Size and appearance within normal limits. Right Kidney: Length: 10.6 cm. Echogenicity within normal limits. No mass or hydronephrosis visualized. Left Kidney: Length: 13 cm. Size asymmetry is attributed to a simple cyst measuring up to 5.5 cm. No solid mass or hydronephrosis. Abdominal aorta: No aneurysm visualized. IMPRESSION: Intra and extrahepatic bile duct dilatation and main pancreatic duct dilatation concerning for obstruction at the pancreatic head or ampulla. Recommend enhanced MRCP or abdominal CT. Electronically Signed   By: Monte Fantasia M.D.   On: 09/16/2018 11:22   Ct Abdomen Pelvis W Contrast  Result Date: 09/16/2018 CLINICAL DATA:  Right-sided abdominal pain and nausea. EXAM: CT ABDOMEN AND PELVIS WITH CONTRAST TECHNIQUE: Multidetector CT imaging of the abdomen and pelvis was performed using the standard protocol following bolus administration of intravenous contrast. CONTRAST:  157mL OMNIPAQUE IOHEXOL 300 MG/ML  SOLN COMPARISON:  None. FINDINGS: Lower chest: Normal heart size. Lung bases are clear. No pleural effusion. Hepatobiliary: Liver is normal in size and contour. Gallbladder is unremarkable. No intrahepatic or extrahepatic biliary ductal dilatation. Pancreas: Unremarkable Spleen: Unremarkable Adrenals/Urinary Tract: Normal adrenal glands. Kidneys enhance symmetrically with contrast. There is a 5.6 cm cyst interpolar region left kidney. Stomach/Bowel: Prior gastric bypass procedure. No abnormal bowel wall thickening or evidence for bowel obstruction. Normal appendix. Soft tissue stranding and small amount of fluid within the small  bowel mesentery (image 43; series 6). Vascular/Lymphatic: Normal caliber abdominal aorta. No retroperitoneal lymphadenopathy. Reproductive: Unremarkable Other: None. Musculoskeletal: Lower thoracic and lumbar spine degenerative changes. No aggressive or acute appearing osseous lesions. IMPRESSION: Nonspecific mesenteric fat stranding, potentially infectious/inflammatory in etiology. No evidence for bowel obstruction. Electronically Signed   By: Lovey Newcomer M.D.   On: 09/16/2018 19:44    Review of Systems  Gastrointestinal: Positive for abdominal pain and nausea.    Blood pressure (!) 162/88, pulse (!) 53, temperature 98.4 F (36.9 C), temperature source Oral, resp. rate 16, last menstrual period 08/10/2005, SpO2 100 %. Physical Exam  Vitals reviewed. Constitutional: She is oriented to person, place, and time. She appears well-developed and well-nourished.  HENT:  Head: Normocephalic and atraumatic.  Eyes: Pupils are equal, round, and reactive to light. No scleral icterus.  Neck: Neck supple.  Cardiovascular: Normal rate and normal heart sounds.  Respiratory: Effort normal and breath sounds normal.  GI: Soft. Bowel sounds are normal. There is abdominal tenderness in the right upper quadrant and epigastric area. There is negative Murphy's sign.  Musculoskeletal: Normal range of motion.  Lymphadenopathy:    She has no cervical adenopathy.  Neurological: She is alert and oriented to person, place, and time.  Skin: Skin is warm and dry.  Psychiatric: She has a normal mood and affect. Her behavior is normal.     Assessment/Plan Abdominal pain s/p gastric bypass  This has been going on five days. She has some concerning signs on her ct scan and some tenderness on her exam but all other objective measures are normal. Internal hernia would be possibility and she may end up needing a dx lsc for this but I dont think urgent given clinical picture, exam and eliquis this am.  Will admit  and  reexamine in am.  Other etiologies could be biliary or marginal ulcer as well  Rolm Bookbinder, MD 09/16/2018, 9:20 PM

## 2018-09-16 NOTE — ED Notes (Signed)
Dr. Wakefield at bedside. 

## 2018-09-16 NOTE — ED Provider Notes (Signed)
Physical Exam  BP (!) 180/127 (BP Location: Right Arm)   Pulse 77   Temp 98.4 F (36.9 C) (Oral)   Resp 20   LMP 08/10/2005   SpO2 100%   Assumed care from Dr. Ralene Bathe at 1900. Briefly, the patient is a 53 y.o. female with PMHx of  has a past medical history of Adenomyosis (7/07), Anemia, Arthritis, Atrial fibrillation ([place]), Bleeding from the nose, Bronchitis, Dysrhythmia, GERD (gastroesophageal reflux disease), H/O blood clots, Hypertension, Obesity, Sinus congestion, Uterine fibroid, and Vitamin D deficiency. here with epigastric abdominal pain radiating to RUQ, and nausea without vomiting.   Patient history of gastric banding and subsequent complications leading to Roux-en-Y gastric bypass in September 2018 at [facility], surgeon Dr. Toney Rakes.   Labs Reviewed  COMPREHENSIVE METABOLIC PANEL - Abnormal; Notable for the following components:      Result Value   Glucose, Bld 135 (*)    Total Bilirubin 0.2 (*)    All other components within normal limits  LIPASE, BLOOD  CBC WITH DIFFERENTIAL/PLATELET  URINALYSIS, ROUTINE W REFLEX MICROSCOPIC    Course of Care:   Physical Exam Vitals signs and nursing note reviewed.  Constitutional:      General: She is not in acute distress.    Appearance: She is well-developed.  HENT:     Head: Normocephalic and atraumatic.  Eyes:     Extraocular Movements: Extraocular movements intact.     Conjunctiva/sclera: Conjunctivae normal.  Neck:     Musculoskeletal: Normal range of motion and neck supple.  Cardiovascular:     Rate and Rhythm: Normal rate and regular rhythm.     Heart sounds: S1 normal and S2 normal.  Pulmonary:     Effort: Pulmonary effort is normal.     Breath sounds: Normal breath sounds.  Abdominal:     General: There is no distension.     Palpations: Abdomen is soft.     Tenderness: There is abdominal tenderness in the right upper quadrant and epigastric area. There is no guarding. Negative signs  include Murphy's sign.  Musculoskeletal: Normal range of motion.        General: No deformity.  Lymphadenopathy:     Cervical: No cervical adenopathy.  Skin:    General: Skin is warm and dry.     Findings: No erythema or rash.  Neurological:     Mental Status: She is alert.     Comments: Cranial nerves grossly intact. Patient moves extremities symmetrically and with good coordination.  Psychiatric:        Behavior: Behavior normal.        Thought Content: Thought content normal.        Judgment: Judgment normal.     ED Course/Procedures   Clinical Course as of Sep 17 2107  Fri Sep 16, 2018  2006 Reassessed. Nonsurgical abdomen, however is uncomfortable in the right upper quadrant and epigastrium.  No guarding or rebound.  Negative Murphy sign.   [AM]  2028 Spoke with Dr. Donne Hazel of general surgery who will come to evaluate patient, and provide input.  Anticipate ultimate transfer over to [facility].   [AM]  2108 Spoke with Dr. Donne Hazel who states that patient's presentation not consistent with internal hernia.  He does state that patient may need further observation as an inpatient, could still have cholecystitis, may need HIDA scan or scope.  He will primarily admit patient to [facility] surgery.  I appreciate his involvement in the care of  this patient.   [AM]    Clinical Course User Index [AM] Albesa Seen, PA-C    Procedures  MDM   Patient nontoxic-appearing, hemodynamically stable but hypertensive.  Abdomen is benign and nonsurgical.  Patient does appear uncomfortable on repeat abdominal exam.  CT is returned which demonstrates nonspecific mesenteric stranding within the small bowel, possible infectious or inflammatory etiology.  This was discussed with Dr. Rosana Hoes, reading physician who states that she does not have any of his findings of an internal hernia such as obstruction, however early internal hernia is possible.  Mesenteric adenitis is also  possible.  States that having a Psychologist, sport and exercise evaluate patient is reasonable.  Patient does state that if she does require transfer to [facility] she is amenable to this.  Case discussed with current attending physician Dr. Julianne Rice.  Recommends consulting [place] surgery here at [facility].  [facility] surgery to primarily admit for further workup and management.  Appreciate their involvement in the care of this patient.    Tamala Julian 09/16/18 2109    Quintella Reichert, MD 09/18/18 Benancio Deeds

## 2018-09-16 NOTE — ED Notes (Signed)
ED Provider at bedside. 

## 2018-09-16 NOTE — ED Provider Notes (Addendum)
[place] EMERGENCY DEPARTMENT Provider Note   CSN: [id number] Arrival date & time: 09/16/18  1519     History   Chief Complaint Chief Complaint  Patient presents with  . Abdominal Pain  . Nausea    HPI [name] is a 53 y.o. female.  The history is provided by the patient and medical records. No language interpreter was used.  Abdominal Pain   [name] is a 53 y.o. female who presents to the Emergency Department complaining of abdominal pain. She developed crampy abdominal pain four days ago. Pain is located throughout her epigastric and radiates to her right upper quadrant. She has associated nausea, no vomiting. She saw her PCP two days ago, who ordered an ultrasound of her abdomen. The ultrasound was performed today and showed biliary ductal dilatation and she was referred to the emergency department. Pain is described as severe in nature. No fevers, dysuria. She does have occasional intermittent diarrhea. She has a history of atrial fibrillation, gastric bypass. She takes Eliquis.   Past Medical History:  Diagnosis Date  . Adenomyosis 7/07  . Anemia   . Arthritis   . Atrial fibrillation ([place])   . Bleeding from the nose   . Bronchitis   . Dysrhythmia   . GERD (gastroesophageal reflux disease)   . H/O blood clots    superficial blood clots with OCPs  . Hypertension   . Obesity   . Sinus congestion    saw ENT, had nasal surgery to clean out nasal area for bacteria, mold  . Uterine fibroid   . Vitamin D deficiency     Patient Active Problem List   Diagnosis Date Noted  . Sleep apnea 07/14/2016  . Primary osteoarthritis of right knee 06/26/2016  . Compartment syndrome of lower extremity ([place]) 08/13/2014  . Atrial fibrillation ([place])   . H/O blood clots   . Anemia   . Hypertension   . Obesity   . Nodule of right lung 05/05/2012  . GERD (gastroesophageal reflux disease) 11/26/2011  . Lap band APS, 02/18/2009 11/13/2011  .  Environmental allergies 11/11/2011  . Anxiety 09/03/2011  . Depressive disorder, not elsewhere classified 10/11/2008  . Gastro-esophageal reflux disease with esophagitis 10/11/2008    Past Surgical History:  Procedure Laterality Date  . ABDOMINAL HYSTERECTOMY    . BREAST BIOPSY     x2  . CESAREAN SECTION    . ESOPHAGOGASTRODUODENOSCOPY N/A 06/15/2016   Procedure: ESOPHAGOGASTRODUODENOSCOPY (EGD);  Surgeon: Alphonsa Overall, MD;  Location: [facility];  Service: General;  Laterality: N/A;  . LAPAROSCOPIC GASTRIC BANDING  2010  . LAPAROSCOPY     x2  . MOUTH SURGERY    . NASAL SINUS SURGERY  4/13  . PARTIAL KNEE ARTHROPLASTY Right 06/26/2016   Procedure: RIGHT UNICOMPARTMENTAL KNEE CODYLE AND PLATEAU MEDIAL COMPARTMENT;  Surgeon: Renette Butters, MD;  Location: [facility];  Service: Orthopedics;  Laterality: Right;     OB History    Gravida  2   Para  1   Term      Preterm      AB      Living  1     SAB      TAB      Ectopic      Multiple      Live Births           Obstetric Comments  One adopted child         Home Medications  Prior to Admission medications   Medication Sig Start Date End Date Taking? Authorizing Provider  albuterol (PROVENTIL) (2.5 MG/3ML) 0.083% nebulizer solution Take 3 mLs (2.5 mg total) by nebulization every 2 (two) hours as needed for wheezing. 10/22/14   Thurnell Lose, MD  apixaban (ELIQUIS) 5 MG TABS tablet Take 5 mg by mouth 2 (two) times daily.    [provider]  CARTIA XT 180 MG 24 hr capsule Take 180 mg by mouth daily. 05/03/14   [provider]  DEXILANT 60 MG capsule Take 60 mg by mouth daily.  07/31/13   [provider]  docusate sodium (COLACE) 100 MG capsule Take 1 capsule (100 mg total) by mouth 2 (two) times daily. Patient not taking: Reported on 08/09/2018 06/26/16   Prudencio Burly III, PA-C  escitalopram (LEXAPRO) 20 MG tablet Take 20 mg by mouth daily.     [provider]  flecainide (TAMBOCOR) 150 MG tablet Take 150 mg by mouth 2 (two) times daily.    [provider]  gabapentin (NEURONTIN) 300 MG capsule 1 po nightly. Patient taking differently: Take 300 mg by mouth at bedtime.  04/27/17   Megan Salon, MD  loratadine (CLARITIN) 10 MG tablet Take 10 mg by mouth daily.    [provider]  meclizine (ANTIVERT) 25 MG tablet Take 1 tablet (25 mg total) by mouth 3 (three) times daily as needed for dizziness. 08/09/18   Long, Wonda Olds, MD  metoprolol succinate (TOPROL-XL) 50 MG 24 hr tablet Take 50 mg by mouth daily. Take with or immediately following a meal.    [provider]  Multiple Vitamins-Minerals (MULTIVITAMIN WITH MINERALS) tablet Take 1 tablet by mouth daily.     [provider]  olmesartan-hydrochlorothiazide (BENICAR HCT) 40-25 MG per tablet Take 1 tablet by mouth daily.    [provider]  potassium chloride 20 MEQ TBCR Take 10 mEq by mouth daily. Patient not taking: Reported on 08/09/2018 06/20/18   Drenda Freeze, MD    Family History Family History  Problem Relation Age of Onset  . Cancer Father        prostate and lung  . Diabetes Father   . Hypertension Father   . Diabetes Mother   . Hypertension Mother   . Ovarian cancer Paternal Grandmother   . Hypertension Brother        x2  . Hypothyroidism Brother   . Hypothyroidism Maternal Grandmother     Social History Social History   Tobacco Use  . Smoking status: Never Smoker  . Smokeless tobacco: Never Used  Substance Use Topics  . Alcohol use: Yes    Alcohol/week: 1.0 - 2.0 standard drinks    Types: 1 - 2 Standard drinks or equivalent per week    Comment: "periodically" 1 or twice a week a glass of wine  . Drug use: No     Allergies   Flagyl [metronidazole] and Levaquin [levofloxacin]   Review of Systems Review of Systems  Gastrointestinal: Positive for abdominal pain.  All other systems reviewed  and are negative.    Physical Exam Updated Vital Signs BP (!) 180/127 (BP Location: Right Arm)   Pulse 77   Temp 98.4 F (36.9 C) (Oral)   Resp 20   LMP 08/10/2005   SpO2 100%   Physical Exam Vitals signs and nursing note reviewed.  Constitutional:      Appearance: She is well-developed.  HENT:     Head: Normocephalic and  atraumatic.  Cardiovascular:     Rate and Rhythm: Normal rate and regular rhythm.     Heart sounds: No murmur.  Pulmonary:     Effort: Pulmonary effort is normal. No respiratory distress.     Breath sounds: Normal breath sounds.  Abdominal:     Palpations: Abdomen is soft.     Tenderness: There is no guarding or rebound.     Comments: Mild generalized abdominal tenderness, greatest over RUQ.    Musculoskeletal:        General: No tenderness.  Skin:    General: Skin is warm and dry.  Neurological:     Mental Status: She is alert and oriented to person, place, and time.  Psychiatric:        Behavior: Behavior normal.      ED Treatments / Results  Labs (all labs ordered are listed, but only abnormal results are displayed) Labs Reviewed  COMPREHENSIVE METABOLIC PANEL - Abnormal; Notable for the following components:      Result Value   Glucose, Bld 135 (*)    Total Bilirubin 0.2 (*)    All other components within normal limits  LIPASE, BLOOD  CBC WITH DIFFERENTIAL/PLATELET  URINALYSIS, ROUTINE W REFLEX MICROSCOPIC    EKG None  Radiology US Abdomen Complete  Result Date: 09/16/2018 CLINICAL DATA:  Epigastric pain after medications. EXAM: ABDOMEN ULTRASOUND COMPLETE COMPARISON:  05/25/2014 FINDINGS: Gallbladder: No gallstones or wall thickening visualized. No sonographic Murphy sign noted by sonographer. Common bile duct: Diameter: 12 mm. Liver: No focal lesion identified. Within normal limits in parenchymal echogenicity. Portal vein is patent on color Doppler imaging with normal direction of blood flow towards the liver. Intrahepatic bile duct  dilatation IVC: No abnormality visualized. Pancreas: Dilated main pancreatic duct at 3 mm. No mass or inflammation is seen. Spleen: Size and appearance within normal limits. Right Kidney: Length: 10.6 cm. Echogenicity within normal limits. No mass or hydronephrosis visualized. Left Kidney: Length: 13 cm. Size asymmetry is attributed to a simple cyst measuring up to 5.5 cm. No solid mass or hydronephrosis. Abdominal aorta: No aneurysm visualized. IMPRESSION: Intra and extrahepatic bile duct dilatation and main pancreatic duct dilatation concerning for obstruction at the pancreatic head or ampulla. Recommend enhanced MRCP or abdominal CT. Electronically Signed   By: Monte Fantasia M.D.   On: 09/16/2018 11:22    Procedures Procedures (including critical care time)  Medications Ordered in ED Medications  HYDROmorphone (DILAUDID) injection 1 mg (1 mg Intravenous Given 09/16/18 1651)  ondansetron (ZOFRAN) injection 4 mg (4 mg Intravenous Given 09/16/18 1649)  sodium chloride 0.9 % bolus 1,000 mL (1,000 mLs Intravenous New Bag/Given 09/16/18 1649)  iohexol (OMNIPAQUE) 300 MG/ML solution 100 mL (100 mLs Intravenous Contrast Given 09/16/18 1822)     Initial Impression / Assessment and Plan / ED Course  I have reviewed the triage vital signs and the nursing notes.  Pertinent labs & imaging results that were available during my care of the patient were reviewed by me and considered in my medical decision making (see chart for details).  Clinical Course as of Sep 18 2051  Ludwig Clarks Sep 16, 2018  2006 Reassessed. Nonsurgical abdomen, however is uncomfortable in the right upper quadrant and epigastrium.  No guarding or rebound.  Negative Murphy sign.   [AM]  2028 Spoke with Dr. Donne Hazel of general surgery who will come to evaluate patient, and provide input.  Anticipate ultimate transfer over to [facility].   [AM]  2108 Spoke with Dr.  Donne Hazel who states that patient's presentation not consistent with  internal hernia.  He does state that patient may need further observation as an inpatient, could still have cholecystitis, may need HIDA scan or scope.  He will primarily admit patient to [facility] surgery.  I appreciate his involvement in the care of this patient.   [AM]    Clinical Course User Index [AM] Albesa Seen, PA-C    Patient with history of gastric bypass here for evaluation of abdominal pain and nausea. She does have tenderness on examination without peritoneal findings. Patient care transferred pending CT abdomen and pelvis.  Final Clinical Impressions(s) / ED Diagnoses   Final diagnoses:  None    ED Discharge Orders    None       Quintella Reichert, MD 09/16/18 Drema Halon    Quintella Reichert, MD 09/17/18 2053

## 2018-09-16 NOTE — ED Triage Notes (Signed)
Pt reports right sided abd pain and nausea since Monday. Pt had US done earlier today from PCP and was sent here for possible cholecystitis. Pt nauseated in triage

## 2018-09-17 DIAGNOSIS — G4730 Sleep apnea, unspecified: Secondary | ICD-10-CM | POA: Diagnosis present

## 2018-09-17 DIAGNOSIS — K5651 Intestinal adhesions [bands], with partial obstruction: Secondary | ICD-10-CM | POA: Diagnosis not present

## 2018-09-17 DIAGNOSIS — R0781 Pleurodynia: Secondary | ICD-10-CM | POA: Diagnosis not present

## 2018-09-17 DIAGNOSIS — M1990 Unspecified osteoarthritis, unspecified site: Secondary | ICD-10-CM | POA: Diagnosis present

## 2018-09-17 DIAGNOSIS — I10 Essential (primary) hypertension: Secondary | ICD-10-CM | POA: Diagnosis not present

## 2018-09-17 DIAGNOSIS — Z833 Family history of diabetes mellitus: Secondary | ICD-10-CM | POA: Diagnosis not present

## 2018-09-17 DIAGNOSIS — Z7901 Long term (current) use of anticoagulants: Secondary | ICD-10-CM | POA: Diagnosis not present

## 2018-09-17 DIAGNOSIS — K9589 Other complications of other bariatric procedure: Secondary | ICD-10-CM | POA: Diagnosis not present

## 2018-09-17 DIAGNOSIS — F419 Anxiety disorder, unspecified: Secondary | ICD-10-CM | POA: Diagnosis present

## 2018-09-17 DIAGNOSIS — Z9884 Bariatric surgery status: Secondary | ICD-10-CM | POA: Diagnosis not present

## 2018-09-17 DIAGNOSIS — Z8041 Family history of malignant neoplasm of ovary: Secondary | ICD-10-CM | POA: Diagnosis not present

## 2018-09-17 DIAGNOSIS — F329 Major depressive disorder, single episode, unspecified: Secondary | ICD-10-CM | POA: Diagnosis present

## 2018-09-17 DIAGNOSIS — K660 Peritoneal adhesions (postprocedural) (postinfection): Secondary | ICD-10-CM | POA: Diagnosis not present

## 2018-09-17 DIAGNOSIS — Z96651 Presence of right artificial knee joint: Secondary | ICD-10-CM | POA: Diagnosis present

## 2018-09-17 DIAGNOSIS — Z881 Allergy status to other antibiotic agents status: Secondary | ICD-10-CM | POA: Diagnosis not present

## 2018-09-17 DIAGNOSIS — I4891 Unspecified atrial fibrillation: Secondary | ICD-10-CM | POA: Diagnosis not present

## 2018-09-17 DIAGNOSIS — R1013 Epigastric pain: Secondary | ICD-10-CM | POA: Diagnosis not present

## 2018-09-17 DIAGNOSIS — K219 Gastro-esophageal reflux disease without esophagitis: Secondary | ICD-10-CM | POA: Diagnosis not present

## 2018-09-17 DIAGNOSIS — R1011 Right upper quadrant pain: Secondary | ICD-10-CM | POA: Diagnosis not present

## 2018-09-17 DIAGNOSIS — Z8249 Family history of ischemic heart disease and other diseases of the circulatory system: Secondary | ICD-10-CM | POA: Diagnosis not present

## 2018-09-17 LAB — COMPREHENSIVE METABOLIC PANEL
ALT: 13 U/L (ref 0–44)
ANION GAP: 5 (ref 5–15)
AST: 18 U/L (ref 15–41)
Albumin: 3.3 g/dL — ABNORMAL LOW (ref 3.5–5.0)
Alkaline Phosphatase: 91 U/L (ref 38–126)
BUN: 6 mg/dL (ref 6–20)
CO2: 29 mmol/L (ref 22–32)
Calcium: 8.6 mg/dL — ABNORMAL LOW (ref 8.9–10.3)
Chloride: 106 mmol/L (ref 98–111)
Creatinine, Ser: 0.73 mg/dL (ref 0.44–1.00)
GFR calc Af Amer: >60 mL/min (ref >60)
GFR calc non Af Amer: >60 mL/min (ref >60)
Glucose, Bld: 93 mg/dL (ref 70–99)
Potassium: 3.8 mmol/L (ref 3.5–5.1)
Sodium: 140 mmol/L (ref 135–145)
Total Bilirubin: 0.7 mg/dL (ref 0.3–1.2)
Total Protein: 6.6 g/dL (ref 6.5–8.1)

## 2018-09-17 LAB — CBC
HCT: 35 % — ABNORMAL LOW (ref 36.0–46.0)
Hemoglobin: 10.7 g/dL — ABNORMAL LOW (ref 12.0–15.0)
MCH: 27.5 pg (ref 26.0–34.0)
MCHC: 30.6 g/dL (ref 30.0–36.0)
MCV: 90 fL (ref 80.0–100.0)
Platelets: 260 10*3/uL (ref 150–400)
RBC: 3.89 MIL/uL (ref 3.87–5.11)
RDW: 13.2 % (ref 11.5–15.5)
WBC: 4.3 10*3/uL (ref 4.0–10.5)
nRBC: 0 % (ref 0.0–0.2)

## 2018-09-17 LAB — TROPONIN I

## 2018-09-17 LAB — MRSA PCR SCREENING: MRSA by PCR: NEGATIVE

## 2018-09-17 MED ORDER — FLEET ENEMA 7-19 GM/118ML RE ENEM
1.0000 | ENEMA | Freq: Once | RECTAL | Status: AC
  Administered 2018-09-17: 1 via RECTAL
  Filled 2018-09-17: qty 1

## 2018-09-17 MED ORDER — NITROGLYCERIN 0.4 MG SL SUBL
SUBLINGUAL_TABLET | SUBLINGUAL | Status: AC
  Administered 2018-09-17: 0.4 mg
  Filled 2018-09-17: qty 3

## 2018-09-17 MED ORDER — NITROGLYCERIN 0.4 MG SL SUBL: 0.4000 mg | SUBLINGUAL_TABLET | SUBLINGUAL | Status: DC | PRN

## 2018-09-17 NOTE — Progress Notes (Signed)
2139: Pt called out while RN was in other pt's room complaining of CP. Pt states pain is in middle of chest and unlike any other pain she has had, including gas pain. Pt could not describe the pain for me. Pt's BP was 196/99, 100% on RA, HR 55. Pt recently given Metoprolol for high BP but has increased again. Rapid called. EKG done and Nitro given. Pt states pain has decreased. MD paged and order for Troponin received. Will continue to monitor.

## 2018-09-17 NOTE — Progress Notes (Addendum)
  Progress Note: General Surgery Service   Assessment/Plan: Active Problems:   Abdominal pain  Abdominal pain after gastric bypass, some remnant stomach distension, mesenteric edema seen on CT scan, dilated CBD seen on [place] without stones. -NPO -pain control -reexamine this afternoon with possible diagnostic laparoscopy    LOS: 0 days  Chief Complaint/Subjective: Pain improved with narcotics/zofran multiple times overnight  Objective: Vital signs in last 24 hours: Temp:  [97.7 F (36.5 C)-98.4 F (36.9 C)] 98 F (36.7 C) (02/08 0809) Pulse Rate:  [52-77] 63 (02/08 0809) Resp:  [16-20] 18 (02/08 0809) BP: (149-183)/(86-127) 170/94 (02/08 0809) SpO2:  [94 %-100 %] 94 % (02/08 0809) Weight:  [121.2 kg] 121.2 kg (02/08 0400)    Intake/Output from previous day: No intake/output data recorded. Intake/Output this shift: No intake/output data recorded.  Lungs: nonlabored  Cardiovascular: RRR  Abd: soft, slight pain RUQ  Extremities: no edema  Neuro: AOx4  Lab Results: CBC  Recent Labs    09/16/18 1534 09/17/18 0628  WBC 6.1 4.3  HGB 12.0 10.7*  HCT 38.4 35.0*  PLT 292 260   BMET Recent Labs    09/16/18 1534 09/17/18 0628  NA 142 140  K 3.8 3.8  CL 101 106  CO2 26 29  GLUCOSE 135* 93  BUN 8 6  CREATININE 0.84 0.73  CALCIUM 9.4 8.6*   PT/INR No results for input(s): LABPROT, INR in the last 72 hours. ABG No results for input(s): PHART, HCO3 in the last 72 hours.  Invalid input(s): PCO2, PO2  Studies/Results:  Anti-infectives: Anti-infectives (From admission, onward)   None      Medications: Scheduled Meds: . diltiazem  180 mg Oral Daily  . enoxaparin (LOVENOX) injection  40 mg Subcutaneous Q24H  . escitalopram  20 mg Oral Daily  . flecainide  150 mg Oral BID  . gabapentin  300 mg Oral QHS  . irbesartan  300 mg Oral Daily  . loratadine  10 mg Oral Daily  . metoprolol succinate  50 mg Oral Daily  . pantoprazole (PROTONIX) IV  40 mg  Intravenous Q12H   Continuous Infusions: . sodium chloride 100 mL/hr at 09/17/18 0927  . acetaminophen 1,000 mg (09/17/18 0643)   PRN Meds:.albuterol, metoprolol tartrate, morphine injection, ondansetron **OR** ondansetron (ZOFRAN) IV  Mickeal Skinner, MD [facility]   Reviewed imaging with Dr. Excell Seltzer. No definitive findings for internal hernia. Will treat constipation, allows liquids and reexamine in the morning

## 2018-09-17 NOTE — Significant Event (Addendum)
Rapid Response Event Note  Overview: Time Called: 2149 Arrival Time: 2205 Event Type: Cardiac  Initial Focused Assessment: Upon arrival, Ms. Rita is awake and alert, oriented x4.  She is c/o of substernal CP 6/10 without radiation.  Stat EKG performed and no acute ST changes.  Pt denies SOB.  Skin is warm, dry and pink.  HR 57 SB, 164/93 (114), RR 12 and sats 99% on RA. SL NTG given 0.4mg  q5 mins x3 given. Dr. Kieth Brightly notified of events. Troponin ordered. Pt stated that her pain level is going down since the NTG. Pt recently received Lopressor.  Interventions: -Stat EKG -SL NTG 0.4 mg q55mins x3  Plan of Care (if not transferred): -Continue to monitor for CP -Notify primary svc and/or RRRN for any further clinical decompensations -PRN Morphine for additional pain management  Event Summary: 2230- pt stated she has a headache but feels ok.  Name of Physician Notified: Dr. Kieth Brightly at 2215    at    Outcome: Stayed in room and stabalized  Update: Troponin <0.03  Madelynn Done

## 2018-09-17 NOTE — Plan of Care (Signed)

## 2018-09-18 LAB — TROPONIN I
Troponin I: <0.03 ng/mL (ref <0.03)
Troponin I: <0.03 ng/mL (ref <0.03)

## 2018-09-18 MED ORDER — ACETAMINOPHEN 325 MG PO TABS
650.0000 mg | ORAL_TABLET | Freq: Four times a day (QID) | ORAL | Status: DC | PRN
  Administered 2018-09-18 – 2018-09-22 (×5): 650 mg via ORAL
  Filled 2018-09-18 (×5): qty 2

## 2018-09-18 MED ORDER — SIMETHICONE 80 MG PO CHEW: 80.0000 mg | CHEWABLE_TABLET | Freq: Four times a day (QID) | ORAL | Status: DC | PRN

## 2018-09-18 MED ORDER — SIMETHICONE 80 MG PO CHEW
80.0000 mg | CHEWABLE_TABLET | Freq: Four times a day (QID) | ORAL | Status: DC | PRN
  Administered 2018-09-18 – 2018-09-20 (×2): 80 mg via ORAL
  Filled 2018-09-18 (×3): qty 1

## 2018-09-18 NOTE — Progress Notes (Signed)
Subjective/Chief Complaint: Feels much better but still sore, had some nausea overnight, tol clears, had bm that made pain much better   Objective: Vital signs in last 24 hours: Temp:  [97.7 F (36.5 C)-98.4 F (36.9 C)] 98 F (36.7 C) (02/09 0807) Pulse Rate:  [53-60] 60 (02/09 0807) Resp:  [13-20] 20 (02/09 0807) BP: (144-196)/(84-99) 157/84 (02/09 0807) SpO2:  [94 %-100 %] 99 % (02/09 0807) Last BM Date: 09/17/18  Intake/Output from previous day: 02/08 0701 - 02/09 0700 In: 1859.8 [I.V.:1859.8] Out: -  Intake/Output this shift: No intake/output data recorded.  General nad cv regular abd soft mild tender ruq, much better than Friday night   Neuro: AOx4 Lab Results:  Recent Labs    09/16/18 1534 09/17/18 0628  WBC 6.1 4.3  HGB 12.0 10.7*  HCT 38.4 35.0*  PLT 292 260   BMET Recent Labs    09/16/18 1534 09/17/18 0628  NA 142 140  K 3.8 3.8  CL 101 106  CO2 26 29  GLUCOSE 135* 93  BUN 8 6  CREATININE 0.84 0.73  CALCIUM 9.4 8.6*   PT/INR No results for input(s): LABPROT, INR in the last 72 hours. ABG No results for input(s): PHART, HCO3 in the last 72 hours.  Invalid input(s): PCO2, PO2  Studies/Results: US Abdomen Complete  Result Date: 09/16/2018 CLINICAL DATA:  Epigastric pain after medications. EXAM: ABDOMEN ULTRASOUND COMPLETE COMPARISON:  05/25/2014 FINDINGS: Gallbladder: No gallstones or wall thickening visualized. No sonographic Murphy sign noted by sonographer. Common bile duct: Diameter: 12 mm. Liver: No focal lesion identified. Within normal limits in parenchymal echogenicity. Portal vein is patent on color Doppler imaging with normal direction of blood flow towards the liver. Intrahepatic bile duct dilatation IVC: No abnormality visualized. Pancreas: Dilated main pancreatic duct at 3 mm. No mass or inflammation is seen. Spleen: Size and appearance within normal limits. Right Kidney: Length: 10.6 cm. Echogenicity within normal limits. No  mass or hydronephrosis visualized. Left Kidney: Length: 13 cm. Size asymmetry is attributed to a simple cyst measuring up to 5.5 cm. No solid mass or hydronephrosis. Abdominal aorta: No aneurysm visualized. IMPRESSION: Intra and extrahepatic bile duct dilatation and main pancreatic duct dilatation concerning for obstruction at the pancreatic head or ampulla. Recommend enhanced MRCP or abdominal CT. Electronically Signed   By: Monte Fantasia M.D.   On: 09/16/2018 11:22   Ct Abdomen Pelvis W Contrast  Result Date: 09/16/2018 CLINICAL DATA:  Right-sided abdominal pain and nausea. EXAM: CT ABDOMEN AND PELVIS WITH CONTRAST TECHNIQUE: Multidetector CT imaging of the abdomen and pelvis was performed using the standard protocol following bolus administration of intravenous contrast. CONTRAST:  169mL OMNIPAQUE IOHEXOL 300 MG/ML  SOLN COMPARISON:  None. FINDINGS: Lower chest: Normal heart size. Lung bases are clear. No pleural effusion. Hepatobiliary: Liver is normal in size and contour. Gallbladder is unremarkable. No intrahepatic or extrahepatic biliary ductal dilatation. Pancreas: Unremarkable Spleen: Unremarkable Adrenals/Urinary Tract: Normal adrenal glands. Kidneys enhance symmetrically with contrast. There is a 5.6 cm cyst interpolar region left kidney. Stomach/Bowel: Prior gastric bypass procedure. No abnormal bowel wall thickening or evidence for bowel obstruction. Normal appendix. Soft tissue stranding and small amount of fluid within the small bowel mesentery (image 43; series 6). Vascular/Lymphatic: Normal caliber abdominal aorta. No retroperitoneal lymphadenopathy. Reproductive: Unremarkable Other: None. Musculoskeletal: Lower thoracic and lumbar spine degenerative changes. No aggressive or acute appearing osseous lesions. IMPRESSION: Nonspecific mesenteric fat stranding, potentially infectious/inflammatory in etiology. No evidence for bowel obstruction. Electronically Signed  By: Lovey Newcomer M.D.   On:  09/16/2018 19:44    Anti-infectives: Anti-infectives (From admission, onward)   None      Assessment/Plan: Abdominal pain after gastric bypass, some remnant stomach distension, mesenteric edema seen on CT scan, dilated CBD seen on [place] without stones and nl lfts-not shown on ct scan -will give fulls today - I think needs at least egd at this point- will discuss with bariatric surgery as she still has pain -no urgent need for surgery  [name] 09/18/2018

## 2018-09-18 NOTE — Plan of Care (Signed)

## 2018-09-19 LAB — COMPREHENSIVE METABOLIC PANEL
ALT: 12 U/L (ref 0–44)
AST: 13 U/L — ABNORMAL LOW (ref 15–41)
Albumin: 3.3 g/dL — ABNORMAL LOW (ref 3.5–5.0)
Alkaline Phosphatase: 85 U/L (ref 38–126)
Anion gap: 6 (ref 5–15)
BILIRUBIN TOTAL: 0.6 mg/dL (ref 0.3–1.2)
BUN: 6 mg/dL (ref 6–20)
CO2: 29 mmol/L (ref 22–32)
Calcium: 8.7 mg/dL — ABNORMAL LOW (ref 8.9–10.3)
Chloride: 106 mmol/L (ref 98–111)
Creatinine, Ser: 0.88 mg/dL (ref 0.44–1.00)
GFR calc Af Amer: >60 mL/min (ref >60)
GFR calc non Af Amer: >60 mL/min (ref >60)
Glucose, Bld: 103 mg/dL — ABNORMAL HIGH (ref 70–99)
Potassium: 3.4 mmol/L — ABNORMAL LOW (ref 3.5–5.1)
Sodium: 141 mmol/L (ref 135–145)
TOTAL PROTEIN: 6.2 g/dL — AB (ref 6.5–8.1)

## 2018-09-19 LAB — CBC
HCT: 35.5 % — ABNORMAL LOW (ref 36.0–46.0)
Hemoglobin: 11 g/dL — ABNORMAL LOW (ref 12.0–15.0)
MCH: 27.2 pg (ref 26.0–34.0)
MCHC: 31 g/dL (ref 30.0–36.0)
MCV: 87.9 fL (ref 80.0–100.0)
Platelets: 269 10*3/uL (ref 150–400)
RBC: 4.04 MIL/uL (ref 3.87–5.11)
RDW: 13.1 % (ref 11.5–15.5)
WBC: 4.3 10*3/uL (ref 4.0–10.5)
nRBC: 0 % (ref 0.0–0.2)

## 2018-09-19 MED ORDER — POTASSIUM CHLORIDE 10 MEQ/100ML IV SOLN
10.0000 meq | INTRAVENOUS | Status: AC
  Administered 2018-09-19 (×3): 10 meq via INTRAVENOUS
  Filled 2018-09-19 (×3): qty 100

## 2018-09-19 MED ORDER — HYDRALAZINE HCL 20 MG/ML IJ SOLN: 5.0000 mg | INTRAMUSCULAR | Status: DC | PRN

## 2018-09-19 NOTE — Progress Notes (Signed)
  [facility] Progress Note     Subjective: CC-  Feeling some better this morning, but continues to have some intermittent RUQ/epigastric abdominal pain. Pain was a little worse yesterday with full liquids. BM yesterday. Denies n/v. WBC WNL, afebrile. She has been intermittently hypertensive.  Objective: Vital signs in last 24 hours: Temp:  [97.5 F (36.4 C)-98.1 F (36.7 C)] 97.5 F (36.4 C) (02/10 0742) Pulse Rate:  [47-89] 47 (02/10 0742) Resp:  [16-20] 20 (02/10 0742) BP: (143-173)/(82-94) 173/94 (02/10 0742) SpO2:  [94 %-100 %] 99 % (02/10 0742) Last BM Date: 09/18/18  Intake/Output from previous day: 02/09 0701 - 02/10 0700 In: 720 [P.O.:720] Out: 3 [Urine:3] Intake/Output this shift: Total I/O In: 120 [P.O.:120] Out: 1 [Urine:1]  PE: Gen:  Alert, NAD, pleasant HEENT: EOM's intact, pupils equal and round Card:  RRR Pulm:  CTAB, no W/R/R, effort normal Abd: Soft, ND, +BS, no HSM, mild RUQ/epigastric TTP Ext: calves soft and nontender Psych: A&Ox3 Skin: no rashes noted, warm and dry  Lab Results:  Recent Labs    09/17/18 0628 09/19/18 0443  WBC 4.3 4.3  HGB 10.7* 11.0*  HCT 35.0* 35.5*  PLT 260 269   BMET Recent Labs    09/17/18 0628 09/19/18 0443  NA 140 141  K 3.8 3.4*  CL 106 106  CO2 29 29  GLUCOSE 93 103*  BUN 6 6  CREATININE 0.73 0.88  CALCIUM 8.6* 8.7*   PT/INR No results for input(s): LABPROT, INR in the last 72 hours. CMP     Component Value Date/Time   NA 141 09/19/2018 0443   K 3.4 (L) 09/19/2018 0443   CL 106 09/19/2018 0443   CO2 29 09/19/2018 0443   GLUCOSE 103 (H) 09/19/2018 0443   BUN 6 09/19/2018 0443   CREATININE 0.88 09/19/2018 0443   CALCIUM 8.7 (L) 09/19/2018 0443   PROT 6.2 (L) 09/19/2018 0443   ALBUMIN 3.3 (L) 09/19/2018 0443   AST 13 (L) 09/19/2018 0443   ALT 12 09/19/2018 0443   ALKPHOS 85 09/19/2018 0443   BILITOT 0.6 09/19/2018 0443   GFRNONAA >60 09/19/2018 0443   GFRAA >60 09/19/2018 0443    Lipase     Component Value Date/Time   LIPASE 25 09/16/2018 1534       Studies/Results: No results found.  Anti-infectives: Anti-infectives (From admission, onward)   None       Assessment/Plan HTN - home meds, IV hydralazine/metoprolol PRN A fib - hold eliquis (last dose 2/7 in AM) Anxiety/depression  Abdominal pain after gastric bypass - h/o lap band converted to gastric bypass [facility] - some remnant stomach distension, mesenteric edema seen on CT scan - dilated CBD seen on [place] without stones and nl lfts-not shown on ct scan  ID - none FEN - IVF, NPO VTE - SCDs, lovenox Foley - none Follow up - TBD  Plan - Replace potassium. Will ask GI to see for upper endoscopy to rule out ulcer. Keep NPO for now. If unable to have procedure today will place back on FLD today with NPO after midnight.   LOS: 2 days    Wellington Hampshire , [facility] 09/19/2018, 11:30 AM Pager: [phone] Mon-Thurs 7:00 am-4:30 pm Fri 7:00 am -11:30 AM Sat-Sun 7:00 am-11:30 am

## 2018-09-19 NOTE — H&P (View-Only) (Signed)
Reason for Consult: ABM pain Referring Physician: CCS  MOZETTA MURFIN HPI: This is a 53 year old female s/p lap band conversion to a Roux-en-Y gastric bypass in 2018 at [facility] who presents with complaints of RUQ ABM pain.  The pain started initially at home and it was rather severe.  She sought care from her PCP who thought it was biliary in origin.  Ultrasound the imaging showed that she had a dilated extrahepatic at 12 mm, but her liver enzymes were normal.  A follow up CT scan on 09/16/2018, the same day, was negative for any biliary ductal dilation.  There was some mesenteric stranding.  Over the interval time period she started to feel better, but she has not been eating.  The patient underwent a colonoscopy with Dr. Collene Mares on 12/17/2017 with findings of a small rectosigmoid adenoma.  Her pain continues to persist and GI was requested for further endoscopic evaluation.  Past Medical History:  Diagnosis Date  . Adenomyosis 7/07  . Anemia   . Arthritis   . Atrial fibrillation ([place])   . Bleeding from the nose   . Bronchitis   . Dysrhythmia   . GERD (gastroesophageal reflux disease)   . H/O blood clots    superficial blood clots with OCPs  . Hypertension   . Obesity   . Sinus congestion    saw ENT, had nasal surgery to clean out nasal area for bacteria, mold  . Uterine fibroid   . Vitamin D deficiency     Past Surgical History:  Procedure Laterality Date  . ABDOMINAL HYSTERECTOMY    . BREAST BIOPSY     x2  . CESAREAN SECTION    . ESOPHAGOGASTRODUODENOSCOPY N/A 06/15/2016   Procedure: ESOPHAGOGASTRODUODENOSCOPY (EGD);  Surgeon: Alphonsa Overall, MD;  Location: [facility];  Service: General;  Laterality: N/A;  . LAPAROSCOPIC GASTRIC BANDING  2010  . LAPAROSCOPY     x2  . MOUTH SURGERY    . NASAL SINUS SURGERY  4/13  . PARTIAL KNEE ARTHROPLASTY Right 06/26/2016   Procedure: RIGHT UNICOMPARTMENTAL KNEE CODYLE AND PLATEAU MEDIAL COMPARTMENT;  Surgeon: Renette Butters, MD;  Location:  [facility];  Service: Orthopedics;  Laterality: Right;    Family History  Problem Relation Age of Onset  . Cancer Father        prostate and lung  . Diabetes Father   . Hypertension Father   . Diabetes Mother   . Hypertension Mother   . Ovarian cancer Paternal Grandmother   . Hypertension Brother        x2  . Hypothyroidism Brother   . Hypothyroidism Maternal Grandmother     Social History:  reports that she has never smoked. She has never used smokeless tobacco. She reports current alcohol use of about 1.0 - 2.0 standard drinks of alcohol per week. She reports that she does not use drugs.  Allergies:  Allergies  Allergen Reactions  . Flagyl [Metronidazole] Anaphylaxis  . Levaquin [Levofloxacin] Other (See Comments)    Pains and aches throughout body, pt states cant move well    Medications:  Scheduled: . diltiazem  180 mg Oral Daily  . enoxaparin (LOVENOX) injection  40 mg Subcutaneous Q24H  . escitalopram  20 mg Oral Daily  . flecainide  150 mg Oral BID  . gabapentin  300 mg Oral QHS  . irbesartan  300 mg Oral Daily  . loratadine  10 mg Oral Daily  . metoprolol succinate  50 mg  Oral Daily  . pantoprazole (PROTONIX) IV  40 mg Intravenous Q12H   Continuous: . sodium chloride 50 mL/hr at 09/18/18 1125    Results for orders placed or performed during the hospital encounter of 09/16/18 (from the past 24 hour(s))  CBC     Status: Abnormal   Collection Time: 09/19/18  4:43 AM  Result Value Ref Range   WBC 4.3 4.0 - 10.5 K/uL   RBC 4.04 3.87 - 5.11 MIL/uL   Hemoglobin 11.0 (L) 12.0 - 15.0 g/dL   HCT 35.5 (L) 36.0 - 46.0 %   MCV 87.9 80.0 - 100.0 fL   MCH 27.2 26.0 - 34.0 pg   MCHC 31.0 30.0 - 36.0 g/dL   RDW 13.1 11.5 - 15.5 %   Platelets 269 150 - 400 K/uL   nRBC 0.0 0.0 - 0.2 %  Comprehensive metabolic panel     Status: Abnormal   Collection Time: 09/19/18  4:43 AM  Result Value Ref Range   Sodium 141 135 - 145 mmol/L   Potassium 3.4 (L) 3.5 -  5.1 mmol/L   Chloride 106 98 - 111 mmol/L   CO2 29 22 - 32 mmol/L   Glucose, Bld 103 (H) 70 - 99 mg/dL   BUN 6 6 - 20 mg/dL   Creatinine, Ser 0.88 0.44 - 1.00 mg/dL   Calcium 8.7 (L) 8.9 - 10.3 mg/dL   Total Protein 6.2 (L) 6.5 - 8.1 g/dL   Albumin 3.3 (L) 3.5 - 5.0 g/dL   AST 13 (L) 15 - 41 U/L   ALT 12 0 - 44 U/L   Alkaline Phosphatase 85 38 - 126 U/L   Total Bilirubin 0.6 0.3 - 1.2 mg/dL   GFR calc non Af Amer >60 >60 mL/min   GFR calc Af Amer >60 >60 mL/min   Anion gap 6 5 - 15     No results found.  ROS:  As stated above in the HPI otherwise negative.  Blood pressure (!) 174/96, pulse (!) 56, temperature 98.4 F (36.9 C), temperature source Oral, resp. rate 18, height 5\' 9"  (1.753 m), weight 121.2 kg, last menstrual period 08/10/2005, SpO2 97 %.    PE: Gen: NAD, Alert and Oriented HEENT:  [place]/AT, EOMI Neck: Supple, no LAD Lungs: CTA Bilaterally CV: RRR without M/G/R ABM: Soft, NTND, +BS, reproducible pain with palpation of the right lower ribs Ext: No C/C/E  Assessment/Plan: 1) RUQ pain - rib pain 2) Epigastric pain. 3) Abnormal CT scan.   Palpation of the right lower ribs reproduced her pain.  Her symptoms are consistent with rib pain it it should respond to a Medrol dose pack, however, with her Roux-en-Y, it is prudent to ensure that there are no luminal issues.  Plan: 1) EGD tomorrow. 2) If the EGD is negative for any evidence of ulcerations or overt inflammation, a Medrol dose pack will be started.  Ferman Basilio D 09/19/2018, 5:09 PM

## 2018-09-19 NOTE — Consult Note (Signed)
Reason for Consult: ABM pain Referring Physician: CCS  [name] HPI: This is a 53 year old female s/p lap band conversion to a Roux-en-Y gastric bypass in 2018 at [facility] who presents with complaints of RUQ ABM pain.  The pain started initially at home and it was rather severe.  She sought care from her PCP who thought it was biliary in origin.  Ultrasound the imaging showed that she had a dilated extrahepatic at 12 mm, but her liver enzymes were normal.  A follow up CT scan on 09/16/2018, the same day, was negative for any biliary ductal dilation.  There was some mesenteric stranding.  Over the interval time period she started to feel better, but she has not been eating.  The patient underwent a colonoscopy with Dr. Collene Mares on 12/17/2017 with findings of a small rectosigmoid adenoma.  Her pain continues to persist and GI was requested for further endoscopic evaluation.  Past Medical History:  Diagnosis Date  . Adenomyosis 7/07  . Anemia   . Arthritis   . Atrial fibrillation ([place])   . Bleeding from the nose   . Bronchitis   . Dysrhythmia   . GERD (gastroesophageal reflux disease)   . H/O blood clots    superficial blood clots with OCPs  . Hypertension   . Obesity   . Sinus congestion    saw ENT, had nasal surgery to clean out nasal area for bacteria, mold  . Uterine fibroid   . Vitamin D deficiency     Past Surgical History:  Procedure Laterality Date  . ABDOMINAL HYSTERECTOMY    . BREAST BIOPSY     x2  . CESAREAN SECTION    . ESOPHAGOGASTRODUODENOSCOPY N/A 06/15/2016   Procedure: ESOPHAGOGASTRODUODENOSCOPY (EGD);  Surgeon: Alphonsa Overall, MD;  Location: [facility];  Service: General;  Laterality: N/A;  . LAPAROSCOPIC GASTRIC BANDING  2010  . LAPAROSCOPY     x2  . MOUTH SURGERY    . NASAL SINUS SURGERY  4/13  . PARTIAL KNEE ARTHROPLASTY Right 06/26/2016   Procedure: RIGHT UNICOMPARTMENTAL KNEE CODYLE AND PLATEAU MEDIAL COMPARTMENT;  Surgeon: Renette Butters, MD;  Location:  [place];  Service: Orthopedics;  Laterality: Right;    Family History  Problem Relation Age of Onset  . Cancer Father        prostate and lung  . Diabetes Father   . Hypertension Father   . Diabetes Mother   . Hypertension Mother   . Ovarian cancer Paternal Grandmother   . Hypertension Brother        x2  . Hypothyroidism Brother   . Hypothyroidism Maternal Grandmother     Social History:  reports that she has never smoked. She has never used smokeless tobacco. She reports current alcohol use of about 1.0 - 2.0 standard drinks of alcohol per week. She reports that she does not use drugs.  Allergies:  Allergies  Allergen Reactions  . Flagyl [Metronidazole] Anaphylaxis  . Levaquin [Levofloxacin] Other (See Comments)    Pains and aches throughout body, pt states cant move well    Medications:  Scheduled: . diltiazem  180 mg Oral Daily  . enoxaparin (LOVENOX) injection  40 mg Subcutaneous Q24H  . escitalopram  20 mg Oral Daily  . flecainide  150 mg Oral BID  . gabapentin  300 mg Oral QHS  . irbesartan  300 mg Oral Daily  . loratadine  10 mg Oral Daily  . metoprolol succinate  50 mg  Oral Daily  . pantoprazole (PROTONIX) IV  40 mg Intravenous Q12H   Continuous: . sodium chloride 50 mL/hr at 09/18/18 1125    Results for orders placed or performed during the hospital encounter of 09/16/18 (from the past 24 hour(s))  CBC     Status: Abnormal   Collection Time: 09/19/18  4:43 AM  Result Value Ref Range   WBC 4.3 4.0 - 10.5 K/uL   RBC 4.04 3.87 - 5.11 MIL/uL   Hemoglobin 11.0 (L) 12.0 - 15.0 g/dL   HCT 35.5 (L) 36.0 - 46.0 %   MCV 87.9 80.0 - 100.0 fL   MCH 27.2 26.0 - 34.0 pg   MCHC 31.0 30.0 - 36.0 g/dL   RDW 13.1 11.5 - 15.5 %   Platelets 269 150 - 400 K/uL   nRBC 0.0 0.0 - 0.2 %  Comprehensive metabolic panel     Status: Abnormal   Collection Time: 09/19/18  4:43 AM  Result Value Ref Range   Sodium 141 135 - 145 mmol/L   Potassium 3.4 (L) 3.5 -  5.1 mmol/L   Chloride 106 98 - 111 mmol/L   CO2 29 22 - 32 mmol/L   Glucose, Bld 103 (H) 70 - 99 mg/dL   BUN 6 6 - 20 mg/dL   Creatinine, Ser 0.88 0.44 - 1.00 mg/dL   Calcium 8.7 (L) 8.9 - 10.3 mg/dL   Total Protein 6.2 (L) 6.5 - 8.1 g/dL   Albumin 3.3 (L) 3.5 - 5.0 g/dL   AST 13 (L) 15 - 41 U/L   ALT 12 0 - 44 U/L   Alkaline Phosphatase 85 38 - 126 U/L   Total Bilirubin 0.6 0.3 - 1.2 mg/dL   GFR calc non Af Amer >60 >60 mL/min   GFR calc Af Amer >60 >60 mL/min   Anion gap 6 5 - 15     No results found.  ROS:  As stated above in the HPI otherwise negative.  Blood pressure (!) 174/96, pulse (!) 56, temperature 98.4 F (36.9 C), temperature source Oral, resp. rate 18, height 5\' 9"  (1.753 m), weight 121.2 kg, last menstrual period 08/10/2005, SpO2 97 %.    PE: Gen: NAD, Alert and Oriented HEENT:  [place]/AT, EOMI Neck: Supple, no LAD Lungs: CTA Bilaterally CV: RRR without M/G/R ABM: Soft, NTND, +BS, reproducible pain with palpation of the right lower ribs Ext: No C/C/E  Assessment/Plan: 1) RUQ pain - rib pain 2) Epigastric pain. 3) Abnormal CT scan.   Palpation of the right lower ribs reproduced her pain.  Her symptoms are consistent with rib pain it it should respond to a Medrol dose pack, however, with her Roux-en-Y, it is prudent to ensure that there are no luminal issues.  Plan: 1) EGD tomorrow. 2) If the EGD is negative for any evidence of ulcerations or overt inflammation, a Medrol dose pack will be started.  Leeanne Butters D 09/19/2018, 5:09 PM

## 2018-09-19 NOTE — Progress Notes (Signed)
We were asked to consult on this patient for EGD, but she follow with Dr. Collene Mares outpatient. Dr. Benson Norway has been contacted and will see the patient later today.  Ellouise Newer, PA-C [facility]

## 2018-09-20 ENCOUNTER — Encounter (HOSPITAL_COMMUNITY): Payer: Self-pay

## 2018-09-20 ENCOUNTER — Encounter (HOSPITAL_COMMUNITY): Admission: EM | Disposition: A | Discharge status: Home / Self Care | Payer: Self-pay | Source: Home / Self Care

## 2018-09-20 ENCOUNTER — Inpatient Hospital Stay (HOSPITAL_COMMUNITY): Payer: BLUE CROSS/BLUE SHIELD | Admitting: Anesthesiology

## 2018-09-20 ENCOUNTER — Other Ambulatory Visit: Payer: Self-pay

## 2018-09-20 HISTORY — PX: ESOPHAGOGASTRODUODENOSCOPY (EGD) WITH PROPOFOL: SHX5813

## 2018-09-20 LAB — COMPREHENSIVE METABOLIC PANEL
ALT: 12 U/L (ref 0–44)
AST: 16 U/L (ref 15–41)
Albumin: 3.3 g/dL — ABNORMAL LOW (ref 3.5–5.0)
Alkaline Phosphatase: 82 U/L (ref 38–126)
Anion gap: 8 (ref 5–15)
BILIRUBIN TOTAL: 0.3 mg/dL (ref 0.3–1.2)
BUN: <5 mg/dL — ABNORMAL LOW (ref 6–20)
CO2: 28 mmol/L (ref 22–32)
Calcium: 9.1 mg/dL (ref 8.9–10.3)
Chloride: 105 mmol/L (ref 98–111)
Creatinine, Ser: 0.81 mg/dL (ref 0.44–1.00)
GFR calc Af Amer: >60 mL/min (ref >60)
Glucose, Bld: 99 mg/dL (ref 70–99)
Potassium: 3.7 mmol/L (ref 3.5–5.1)
Sodium: 141 mmol/L (ref 135–145)
Total Protein: 6.2 g/dL — ABNORMAL LOW (ref 6.5–8.1)

## 2018-09-20 LAB — CBC
HCT: 37.1 % (ref 36.0–46.0)
Hemoglobin: 11.5 g/dL — ABNORMAL LOW (ref 12.0–15.0)
MCH: 27.4 pg (ref 26.0–34.0)
MCHC: 31 g/dL (ref 30.0–36.0)
MCV: 88.5 fL (ref 80.0–100.0)
Platelets: 292 10*3/uL (ref 150–400)
RBC: 4.19 MIL/uL (ref 3.87–5.11)
RDW: 13.2 % (ref 11.5–15.5)
WBC: 4.5 10*3/uL (ref 4.0–10.5)
nRBC: 0 % (ref 0.0–0.2)

## 2018-09-20 LAB — MAGNESIUM: Magnesium: 2 mg/dL (ref 1.7–2.4)

## 2018-09-20 SURGERY — ESOPHAGOGASTRODUODENOSCOPY (EGD) WITH PROPOFOL
Anesthesia: Monitor Anesthesia Care

## 2018-09-20 MED ORDER — GABAPENTIN 300 MG PO CAPS
300.0000 mg | ORAL_CAPSULE | ORAL | Status: AC
  Administered 2018-09-21: 300 mg via ORAL
  Filled 2018-09-20: qty 1

## 2018-09-20 MED ORDER — CHLORHEXIDINE GLUCONATE CLOTH 2 % EX PADS
6.0000 | MEDICATED_PAD | Freq: Once | CUTANEOUS | Status: AC
  Administered 2018-09-21: 6 via TOPICAL

## 2018-09-20 MED ORDER — CEFAZOLIN SODIUM-DEXTROSE 2-4 GM/100ML-% IV SOLN
2.0000 g | INTRAVENOUS | Status: AC
  Administered 2018-09-21: 2 g via INTRAVENOUS
  Filled 2018-09-20 (×2): qty 100

## 2018-09-20 MED ORDER — PROPOFOL 500 MG/50ML IV EMUL
INTRAVENOUS | Status: DC | PRN
  Administered 2018-09-20: 50 ug/kg/min via INTRAVENOUS

## 2018-09-20 MED ORDER — PROPOFOL 10 MG/ML IV BOLUS
INTRAVENOUS | Status: DC | PRN
  Administered 2018-09-20 (×2): 40 mg via INTRAVENOUS

## 2018-09-20 MED ORDER — CHLORHEXIDINE GLUCONATE CLOTH 2 % EX PADS: 6.0000 | MEDICATED_PAD | Freq: Once | CUTANEOUS | Status: DC

## 2018-09-20 MED ORDER — ACETAMINOPHEN 500 MG PO TABS
1000.0000 mg | ORAL_TABLET | ORAL | Status: AC
  Administered 2018-09-21: 1000 mg via ORAL
  Filled 2018-09-20: qty 2

## 2018-09-20 MED ORDER — BUTAMBEN-TETRACAINE-BENZOCAINE 2-2-14 % EX AERO
INHALATION_SPRAY | CUTANEOUS | Status: DC | PRN
  Administered 2018-09-20: 2 via TOPICAL

## 2018-09-20 SURGICAL SUPPLY — 15 items

## 2018-09-20 NOTE — Anesthesia Preprocedure Evaluation (Signed)
Anesthesia Evaluation  Patient identified by MRN, date of birth, ID band Patient awake    Reviewed: Allergy & Precautions, NPO status , Patient's Chart, lab work & pertinent test results  Airway Mallampati: I  TM Distance: >3 FB Neck ROM: Full    Dental no notable dental hx.    Pulmonary    Pulmonary exam normal breath sounds clear to auscultation       Cardiovascular hypertension, Pt. on home beta blockers Normal cardiovascular exam+ dysrhythmias Atrial Fibrillation  Rhythm:Regular Rate:Normal     Neuro/Psych negative neurological ROS  negative psych ROS   GI/Hepatic negative GI ROS, Neg liver ROS,   Endo/Other  Morbid obesity  Renal/GU negative Renal ROS  negative genitourinary   Musculoskeletal negative musculoskeletal ROS (+)   Abdominal (+) + obese,   Peds negative pediatric ROS (+)  Hematology   Anesthesia Other Findings   Reproductive/Obstetrics negative OB ROS                             Anesthesia Physical  Anesthesia Plan  ASA: III  Anesthesia Plan: MAC   Post-op Pain Management:    Induction: Intravenous  PONV Risk Score and Plan: 2 and Ondansetron and Midazolam  Airway Management Planned: Nasal Cannula, Natural Airway and Simple Face Mask  Additional Equipment:   Intra-op Plan:   Post-operative Plan:   Informed Consent: I have reviewed the patients History and Physical, chart, labs and discussed the procedure including the risks, benefits and alternatives for the proposed anesthesia with the patient or authorized representative who has indicated his/her understanding and acceptance.       Plan Discussed with: CRNA and Surgeon  Anesthesia Plan Comments:         Anesthesia Quick Evaluation

## 2018-09-20 NOTE — Progress Notes (Signed)
[name], female   DOB: February 13, 1966, 53 y.o.   MRN: [id number]       Subjective: No new complaints today.  Awaiting EGD  Objective: Vital signs in last 24 hours: Temp:  [97.4 F (36.3 C)-98.4 F (36.9 C)] 97.4 F (36.3 C) (02/11 0747) Pulse Rate:  [56-76] 61 (02/11 0747) Resp:  [17-20] 17 (02/10 1958) BP: (138-178)/(73-100) 138/93 (02/11 0747) SpO2:  [97 %-100 %] 100 % (02/11 0747) Weight:  [778.2 kg] 119.2 kg (02/11 0747) Last BM Date: 09/18/18  Intake/Output from previous day: 02/10 0701 - 02/11 0700 In: 1895.5 [P.O.:720; I.V.:942.6; IV Piggyback:232.9] Out: 3 [Urine:3] Intake/Output this shift: Total I/O In: 490 [P.O.:240; I.V.:250] Out: -   PE: Heart: regular Lungs: CTAB Abd: soft, mildly tender in RUQ, +BS, ND  Lab Results:  Recent Labs    09/19/18 0443 09/20/18 0441  WBC 4.3 4.5  HGB 11.0* 11.5*  HCT 35.5* 37.1  PLT 269 292   BMET Recent Labs    09/19/18 0443 09/20/18 0441  NA 141 141  K 3.4* 3.7  CL 106 105  CO2 29 28  GLUCOSE 103* 99  BUN 6 <5*  CREATININE 0.88 0.81  CALCIUM 8.7* 9.1   PT/INR No results for input(s): LABPROT, INR in the last 72 hours. CMP     Component Value Date/Time   NA 141 09/20/2018 0441   K 3.7 09/20/2018 0441   CL 105 09/20/2018 0441   CO2 28 09/20/2018 0441   GLUCOSE 99 09/20/2018 0441   BUN <5 (L) 09/20/2018 0441   CREATININE 0.81 09/20/2018 0441   CALCIUM 9.1 09/20/2018 0441   PROT 6.2 (L) 09/20/2018 0441   ALBUMIN 3.3 (L) 09/20/2018 0441   AST 16 09/20/2018 0441   ALT 12 09/20/2018 0441   ALKPHOS 82 09/20/2018 0441   BILITOT 0.3 09/20/2018 0441   GFRNONAA >60 09/20/2018 0441   GFRAA >60 09/20/2018 0441   Lipase     Component Value Date/Time   LIPASE 25 09/16/2018 1534       Studies/Results: No results found.  Anti-infectives: Anti-infectives (From admission, onward)   None       Assessment/Plan HTN - home meds, IV hydralazine/metoprolol PRN A fib - hold eliquis (last  dose 2/7 in AM) Anxiety/depression  Abdominal pain after gastric bypass - h/o lap band converted to gastric bypass at [facility] 04/2017 - some remnant stomach distension, mesenteric edema seen on CT scan - dilated CBD seen on [place] without stonesand nl lfts-not shown on ct scan -EGD today.  If negative, will plan to offer dx lap with cholecystectomy.  If EGD + then will treat for ulcer disease   ID - none FEN - IVF, NPO VTE - SCDs, lovenox Foley - none Follow up - TBD    LOS: 3 days    Henreitta Cea , [facility] 09/20/2018, 11:24 AM Pager: [phone]

## 2018-09-20 NOTE — H&P (View-Only) (Signed)
[name], female   DOB: Nov 27, 1965, 53 y.o.   MRN: [id number]       Subjective: No new complaints today.  Awaiting EGD  Objective: Vital signs in last 24 hours: Temp:  [97.4 F (36.3 C)-98.4 F (36.9 C)] 97.4 F (36.3 C) (02/11 0747) Pulse Rate:  [56-76] 61 (02/11 0747) Resp:  [17-20] 17 (02/10 1958) BP: (138-178)/(73-100) 138/93 (02/11 0747) SpO2:  [97 %-100 %] 100 % (02/11 0747) Weight:  [130.8 kg] 119.2 kg (02/11 0747) Last BM Date: 09/18/18  Intake/Output from previous day: 02/10 0701 - 02/11 0700 In: 1895.5 [P.O.:720; I.V.:942.6; IV Piggyback:232.9] Out: 3 [Urine:3] Intake/Output this shift: Total I/O In: 490 [P.O.:240; I.V.:250] Out: -   PE: Heart: regular Lungs: CTAB Abd: soft, mildly tender in RUQ, +BS, ND  Lab Results:  Recent Labs    09/19/18 0443 09/20/18 0441  WBC 4.3 4.5  HGB 11.0* 11.5*  HCT 35.5* 37.1  PLT 269 292   BMET Recent Labs    09/19/18 0443 09/20/18 0441  NA 141 141  K 3.4* 3.7  CL 106 105  CO2 29 28  GLUCOSE 103* 99  BUN 6 <5*  CREATININE 0.88 0.81  CALCIUM 8.7* 9.1   PT/INR No results for input(s): LABPROT, INR in the last 72 hours. CMP     Component Value Date/Time   NA 141 09/20/2018 0441   K 3.7 09/20/2018 0441   CL 105 09/20/2018 0441   CO2 28 09/20/2018 0441   GLUCOSE 99 09/20/2018 0441   BUN <5 (L) 09/20/2018 0441   CREATININE 0.81 09/20/2018 0441   CALCIUM 9.1 09/20/2018 0441   PROT 6.2 (L) 09/20/2018 0441   ALBUMIN 3.3 (L) 09/20/2018 0441   AST 16 09/20/2018 0441   ALT 12 09/20/2018 0441   ALKPHOS 82 09/20/2018 0441   BILITOT 0.3 09/20/2018 0441   GFRNONAA >60 09/20/2018 0441   GFRAA >60 09/20/2018 0441   Lipase     Component Value Date/Time   LIPASE 25 09/16/2018 1534       Studies/Results: No results found.  Anti-infectives: Anti-infectives (From admission, onward)   None       Assessment/Plan HTN - home meds, IV hydralazine/metoprolol PRN A fib - hold eliquis (last  dose 2/7 in AM) Anxiety/depression  Abdominal pain after gastric bypass - h/o lap band converted to gastric bypass at [facility] 04/2017 - some remnant stomach distension, mesenteric edema seen on CT scan - dilated CBD seen on [place] without stonesand nl lfts-not shown on ct scan -EGD today.  If negative, will plan to offer dx lap with cholecystectomy.  If EGD + then will treat for ulcer disease   ID - none FEN - IVF, NPO VTE - SCDs, lovenox Foley - none Follow up - TBD    LOS: 3 days    Henreitta Cea , [facility] 09/20/2018, 11:24 AM Pager: [phone]

## 2018-09-20 NOTE — Op Note (Signed)
[facility] Patient Name: [name] Procedure Date : 09/20/2018 MRN: [id number] Attending MD: Carol Ada , MD Date of Birth: 03/09/1966 CSN: [id number] Age: 53 Admit Type: Inpatient Procedure:                Upper GI endoscopy Indications:              Epigastric abdominal pain, Abdominal pain in the                            right upper quadrant Providers:                Carol Ada, MD, Raynelle Bring, RN, Cletis [place],                            Technician Referring MD:              Medicines:                Propofol per Anesthesia Complications:            No immediate complications. Estimated Blood Loss:     Estimated blood loss: none. Procedure:                Pre-Anesthesia Assessment:                           - Prior to the procedure, a History and Physical                            was performed, and patient medications and                            allergies were reviewed. The patient's tolerance of                            previous anesthesia was also reviewed. The risks                            and benefits of the procedure and the sedation                            options and risks were discussed with the patient.                            All questions were answered, and informed consent                            was obtained. Prior Anticoagulants: The patient has                            taken Lovenox (enoxaparin), last dose was day of                            procedure. ASA Grade Assessment: II - A patient  with mild systemic disease. After reviewing the                            risks and benefits, the patient was deemed in                            satisfactory condition to undergo the procedure.                           - Sedation was administered by an anesthesia                            professional. Deep sedation was attained.                           After obtaining informed consent, the endoscope  was                            passed under direct vision. Throughout the                            procedure, the patient's blood pressure, pulse, and                            oxygen saturations were monitored continuously. The                            GIF-H190 ([id number]) Olympus gastroscope was                            introduced through the mouth, and advanced to the                            efferent jejunal loop. The upper GI endoscopy was                            accomplished without difficulty. The patient                            tolerated the procedure well. Scope In: Scope Out: Findings:      The esophagus was normal.      Evidence of a Roux-en-Y gastrojejunostomy was found. The gastrojejunal       anastomosis was characterized by healthy appearing mucosa. This was       traversed. The pouch-to-jejunum limb was characterized by healthy       appearing mucosa.      Normal mucosa was found in the jejunum.      The gastric pouch measured 4 cm in length. There was no evidence of any       ulcerations or erosions as a source for the patient's pain. Impression:               - Normal esophagus.                           - Roux-en-Y gastrojejunostomy with gastrojejunal  anastomosis characterized by healthy appearing                            mucosa.                           - Normal mucosa was found in the jejunum.                           - No specimens collected. Recommendation:           - Return patient to hospital ward for ongoing care.                           - Resume regular diet.                           - Continue present medications.                           - Consider using a Medrol dose pack for the                            treatment of rib pain.                           - Further management per surgery. Procedure Code(s):        --- Professional ---                           [phone], Esophagogastroduodenoscopy, flexible,                             transoral; diagnostic, including collection of                            specimen(s) by brushing or washing, when performed                            (separate procedure) Diagnosis Code(s):        --- Professional ---                           Z98.0, Intestinal bypass and anastomosis status                           R10.13, Epigastric pain                           R10.11, Right upper quadrant pain CPT copyright 2018 [employer] Medical Association. All rights reserved. The codes documented in this report are preliminary and upon coder review may  be revised to meet current compliance requirements. Carol Ada, MD Carol Ada, MD 09/20/2018 1:15:31 PM This report has been signed electronically. Number of Addenda: 0

## 2018-09-20 NOTE — Interval H&P Note (Signed)
History and Physical Interval Note:  09/20/2018 12:49 PM  [name]  has presented today for surgery, with the diagnosis of RUQ pain  The various methods of treatment have been discussed with the patient and family. After consideration of risks, benefits and other options for treatment, the patient has consented to  Procedure(s): ESOPHAGOGASTRODUODENOSCOPY (EGD) WITH PROPOFOL (N/A) as a surgical intervention .  The patient's history has been reviewed, patient examined, no change in status, stable for surgery.  I have reviewed the patient's chart and labs.  Questions were answered to the patient's satisfaction.     Danel Studzinski D

## 2018-09-20 NOTE — Progress Notes (Signed)
Patient with history of gastric bypass surgery, recent knee surgery, having bowel and abdominal issues since surgery.  Patient is alert and pleasant, has ambulated independently to the chair this morning and denies pain. States "not eating makes it much better but the pain was unbearable leading up to my admission". Patient very knowledgeable about her health care and history.  Patient NPO but had sips of apple juice with morning medications.  Anticipated discharge today if EGD goes well.

## 2018-09-21 ENCOUNTER — Encounter (HOSPITAL_COMMUNITY): Payer: Self-pay | Admitting: Anesthesiology

## 2018-09-21 ENCOUNTER — Inpatient Hospital Stay (HOSPITAL_COMMUNITY): Payer: BLUE CROSS/BLUE SHIELD | Admitting: Anesthesiology

## 2018-09-21 ENCOUNTER — Encounter (HOSPITAL_COMMUNITY): Admission: EM | Disposition: A | Discharge status: Home / Self Care | Payer: Self-pay | Source: Home / Self Care

## 2018-09-21 HISTORY — PX: LAPAROSCOPY: SHX197

## 2018-09-21 SURGERY — LAPAROSCOPY, DIAGNOSTIC
Anesthesia: General | Site: Abdomen

## 2018-09-21 MED ORDER — MEPERIDINE HCL 50 MG/ML IJ SOLN: 6.2500 mg | INTRAMUSCULAR | Status: DC | PRN

## 2018-09-21 MED ORDER — HYDROMORPHONE HCL 1 MG/ML IJ SOLN
0.2500 mg | INTRAMUSCULAR | Status: DC | PRN
  Administered 2018-09-21 (×2): 0.5 mg via INTRAVENOUS

## 2018-09-21 MED ORDER — DEXAMETHASONE SODIUM PHOSPHATE 10 MG/ML IJ SOLN
INTRAMUSCULAR | Status: AC
  Filled 2018-09-21: qty 1

## 2018-09-21 MED ORDER — PROPOFOL 1000 MG/100ML IV EMUL
INTRAVENOUS | Status: AC
  Filled 2018-09-21: qty 100

## 2018-09-21 MED ORDER — ROCURONIUM BROMIDE 50 MG/5ML IV SOSY
PREFILLED_SYRINGE | INTRAVENOUS | Status: DC | PRN
  Administered 2018-09-21 (×3): 10 mg via INTRAVENOUS
  Administered 2018-09-21: 20 mg via INTRAVENOUS
  Administered 2018-09-21: 50 mg via INTRAVENOUS
  Administered 2018-09-21: 40 mg via INTRAVENOUS

## 2018-09-21 MED ORDER — CEFAZOLIN SODIUM-DEXTROSE 2-3 GM-%(50ML) IV SOLR
INTRAVENOUS | Status: DC | PRN
  Administered 2018-09-21: 2 g via INTRAVENOUS

## 2018-09-21 MED ORDER — LIDOCAINE 2% (20 MG/ML) 5 ML SYRINGE
INTRAMUSCULAR | Status: DC | PRN
  Administered 2018-09-21: 100 mg via INTRAVENOUS

## 2018-09-21 MED ORDER — MIDAZOLAM HCL 2 MG/2ML IJ SOLN
INTRAMUSCULAR | Status: AC
  Filled 2018-09-21: qty 2

## 2018-09-21 MED ORDER — BUPIVACAINE-EPINEPHRINE 0.25% -1:200000 IJ SOLN: INTRAMUSCULAR | Status: DC | PRN

## 2018-09-21 MED ORDER — PROPOFOL 10 MG/ML IV BOLUS
INTRAVENOUS | Status: DC | PRN
  Administered 2018-09-21: 150 mg via INTRAVENOUS
  Administered 2018-09-21: 50 mg via INTRAVENOUS

## 2018-09-21 MED ORDER — DEXAMETHASONE SODIUM PHOSPHATE 10 MG/ML IJ SOLN
INTRAMUSCULAR | Status: DC | PRN
  Administered 2018-09-21: 5 mg via INTRAVENOUS

## 2018-09-21 MED ORDER — ONDANSETRON HCL 4 MG/2ML IJ SOLN
INTRAMUSCULAR | Status: AC
  Filled 2018-09-21: qty 2

## 2018-09-21 MED ORDER — LACTATED RINGERS IV SOLN
INTRAVENOUS | Status: DC
  Administered 2018-09-21 (×2): via INTRAVENOUS

## 2018-09-21 MED ORDER — FENTANYL CITRATE (PF) 250 MCG/5ML IJ SOLN
INTRAMUSCULAR | Status: AC
  Filled 2018-09-21: qty 5

## 2018-09-21 MED ORDER — GABAPENTIN 300 MG PO CAPS
300.0000 mg | ORAL_CAPSULE | Freq: Once | ORAL | Status: DC
  Filled 2018-09-21: qty 1

## 2018-09-21 MED ORDER — BUPIVACAINE HCL (PF) 0.25 % IJ SOLN
INTRAMUSCULAR | Status: AC
  Filled 2018-09-21: qty 30

## 2018-09-21 MED ORDER — HYDROMORPHONE HCL 1 MG/ML IJ SOLN
INTRAMUSCULAR | Status: AC
  Filled 2018-09-21: qty 1

## 2018-09-21 MED ORDER — OXYCODONE HCL 5 MG PO TABS
5.0000 mg | ORAL_TABLET | ORAL | Status: DC | PRN
  Administered 2018-09-21 – 2018-09-22 (×3): 5 mg via ORAL
  Filled 2018-09-21 (×4): qty 1

## 2018-09-21 MED ORDER — STERILE WATER FOR IRRIGATION IR SOLN
Status: DC | PRN
  Administered 2018-09-21: 400 mL

## 2018-09-21 MED ORDER — GLYCOPYRROLATE 0.2 MG/ML IJ SOLN
INTRAMUSCULAR | Status: DC | PRN
  Administered 2018-09-21: 0.2 mg via INTRAVENOUS

## 2018-09-21 MED ORDER — ENOXAPARIN SODIUM 40 MG/0.4ML ~~LOC~~ SOLN
40.0000 mg | SUBCUTANEOUS | Status: DC
  Administered 2018-09-22: 40 mg via SUBCUTANEOUS
  Filled 2018-09-21: qty 0.4

## 2018-09-21 MED ORDER — EPHEDRINE SULFATE-NACL 50-0.9 MG/10ML-% IV SOSY
PREFILLED_SYRINGE | INTRAVENOUS | Status: DC | PRN
  Administered 2018-09-21 (×3): 10 mg via INTRAVENOUS

## 2018-09-21 MED ORDER — SODIUM CHLORIDE 0.9 % IV SOLN
INTRAVENOUS | Status: DC | PRN
  Administered 2018-09-21: 20 ug/min via INTRAVENOUS

## 2018-09-21 MED ORDER — ROCURONIUM BROMIDE 50 MG/5ML IV SOSY
PREFILLED_SYRINGE | INTRAVENOUS | Status: AC
  Filled 2018-09-21: qty 5

## 2018-09-21 MED ORDER — HYDROCODONE-ACETAMINOPHEN 7.5-325 MG PO TABS
1.0000 | ORAL_TABLET | Freq: Once | ORAL | Status: AC | PRN
  Administered 2018-09-21: 1 via ORAL

## 2018-09-21 MED ORDER — MIDAZOLAM HCL 5 MG/5ML IJ SOLN
INTRAMUSCULAR | Status: DC | PRN
  Administered 2018-09-21: 2 mg via INTRAVENOUS

## 2018-09-21 MED ORDER — ACETAMINOPHEN 500 MG PO TABS
1000.0000 mg | ORAL_TABLET | Freq: Once | ORAL | Status: AC
  Administered 2018-09-21: 1000 mg via ORAL
  Filled 2018-09-21: qty 2

## 2018-09-21 MED ORDER — PROMETHAZINE HCL 25 MG/ML IJ SOLN: 6.2500 mg | INTRAMUSCULAR | Status: DC | PRN

## 2018-09-21 MED ORDER — 0.9 % SODIUM CHLORIDE (POUR BTL) OPTIME
TOPICAL | Status: DC | PRN
  Administered 2018-09-21: 1000 mL

## 2018-09-21 MED ORDER — SODIUM CHLORIDE 0.9 % IR SOLN
Status: DC | PRN
  Administered 2018-09-21: 1000 mL

## 2018-09-21 MED ORDER — FENTANYL CITRATE (PF) 250 MCG/5ML IJ SOLN
INTRAMUSCULAR | Status: DC | PRN
  Administered 2018-09-21: 50 ug via INTRAVENOUS
  Administered 2018-09-21 (×2): 100 ug via INTRAVENOUS

## 2018-09-21 MED ORDER — IOPAMIDOL (ISOVUE-300) INJECTION 61%
INTRAVENOUS | Status: AC
  Filled 2018-09-21: qty 50

## 2018-09-21 MED ORDER — SUGAMMADEX SODIUM 200 MG/2ML IV SOLN
INTRAVENOUS | Status: DC | PRN
  Administered 2018-09-21: 238.4 mg via INTRAVENOUS

## 2018-09-21 MED ORDER — ACETAMINOPHEN 10 MG/ML IV SOLN: 1000.0000 mg | Freq: Once | INTRAVENOUS | Status: DC | PRN

## 2018-09-21 MED ORDER — ONDANSETRON HCL 4 MG/2ML IJ SOLN
INTRAMUSCULAR | Status: DC | PRN
  Administered 2018-09-21: 4 mg via INTRAVENOUS

## 2018-09-21 MED ORDER — HYDROCODONE-ACETAMINOPHEN 7.5-325 MG PO TABS
ORAL_TABLET | ORAL | Status: AC
  Administered 2018-09-21: 1 via ORAL
  Filled 2018-09-21: qty 1

## 2018-09-21 MED ORDER — BUPIVACAINE HCL (PF) 0.25 % IJ SOLN
INTRAMUSCULAR | Status: DC | PRN
  Administered 2018-09-21: 30 mL

## 2018-09-21 SURGICAL SUPPLY — 64 items
APPLIER CLIP 5 13 M/L LIGAMAX5 (MISCELLANEOUS) ×4
BANDAGE ADH SHEER 1  50/CT (GAUZE/BANDAGES/DRESSINGS) ×12 IMPLANT
BENZOIN TINCTURE PRP APPL 2/3 (GAUZE/BANDAGES/DRESSINGS) ×4 IMPLANT
BLADE CLIPPER SURG (BLADE) IMPLANT
CANISTER SUCT 3000ML PPV (MISCELLANEOUS) ×4 IMPLANT
CHLORAPREP W/TINT 26ML (MISCELLANEOUS) ×4 IMPLANT
CLIP APPLIE 5 13 M/L LIGAMAX5 (MISCELLANEOUS) ×2 IMPLANT
CLOSURE WOUND 1/2 X4 (GAUZE/BANDAGES/DRESSINGS) ×1
COVER MAYO STAND STRL (DRAPES) ×1 IMPLANT
COVER SURGICAL LIGHT HANDLE (MISCELLANEOUS) ×4 IMPLANT
COVER WAND RF STERILE (DRAPES) ×7 IMPLANT
DECANTER SPIKE VIAL GLASS SM (MISCELLANEOUS) ×5 IMPLANT
DERMABOND ADVANCED (GAUZE/BANDAGES/DRESSINGS) ×2
DERMABOND ADVANCED .7 DNX12 (GAUZE/BANDAGES/DRESSINGS) ×2 IMPLANT
DRAPE C-ARM 42X72 X-RAY (DRAPES) ×1 IMPLANT
DRAPE HALF SHEET 40X57 (DRAPES) ×6 IMPLANT
DRAPE LAPAROSCOPIC ABDOMINAL (DRAPES) ×4 IMPLANT
DRAPE UTILITY XL STRL (DRAPES) ×3 IMPLANT
DRAPE WARM FLUID 44X44 (DRAPE) ×1 IMPLANT
DRSG TEGADERM 4X4.75 (GAUZE/BANDAGES/DRESSINGS) ×1 IMPLANT
ELECT REM PT RETURN 9FT ADLT (ELECTROSURGICAL) ×4
ELECTRODE REM PT RTRN 9FT ADLT (ELECTROSURGICAL) ×2 IMPLANT
GAUZE SPONGE 2X2 8PLY STRL LF (GAUZE/BANDAGES/DRESSINGS) ×2 IMPLANT
GLOVE BIO SURGEON STRL SZ 6.5 (GLOVE) ×2 IMPLANT
GLOVE BIO SURGEON STRL SZ7 (GLOVE) ×3 IMPLANT
GLOVE BIO SURGEON STRL SZ7.5 (GLOVE) ×4 IMPLANT
GLOVE BIO SURGEON STRL SZ8 (GLOVE) ×3 IMPLANT
GLOVE BIO SURGEONS STRL SZ 6.5 (GLOVE) ×1
GLOVE BIOGEL M STRL SZ7.5 (GLOVE) ×4 IMPLANT
GLOVE BIOGEL PI IND STRL 7.0 (GLOVE) ×2 IMPLANT
GLOVE BIOGEL PI IND STRL 8 (GLOVE) ×4 IMPLANT
GLOVE BIOGEL PI INDICATOR 7.0 (GLOVE) ×4
GLOVE BIOGEL PI INDICATOR 8 (GLOVE) ×4
GLOVE SURG SS PI 6.5 STRL IVOR (GLOVE) ×3 IMPLANT
GOWN STRL REUS W/ TWL LRG LVL3 (GOWN DISPOSABLE) ×6 IMPLANT
GOWN STRL REUS W/TWL 2XL LVL3 (GOWN DISPOSABLE) ×4 IMPLANT
GOWN STRL REUS W/TWL LRG LVL3 (GOWN DISPOSABLE) ×6
GRASPER SUT TROCAR 14GX15 (MISCELLANEOUS) IMPLANT
KIT BASIN OR (CUSTOM PROCEDURE TRAY) ×4 IMPLANT
KIT TURNOVER KIT B (KITS) ×4 IMPLANT
NS IRRIG 1000ML POUR BTL (IV SOLUTION) ×4 IMPLANT
PAD ARMBOARD 7.5X6 YLW CONV (MISCELLANEOUS) ×8 IMPLANT
POUCH RETRIEVAL ECOSAC 10 (ENDOMECHANICALS) ×2 IMPLANT
POUCH RETRIEVAL ECOSAC 10MM (ENDOMECHANICALS) ×2
SCISSORS LAP 5X35 DISP (ENDOMECHANICALS) ×4 IMPLANT
SET CHOLANGIOGRAPH 5 50 .035 (SET/KITS/TRAYS/PACK) ×1 IMPLANT
SET IRRIG TUBING LAPAROSCOPIC (IRRIGATION / IRRIGATOR) ×4 IMPLANT
SET TUBE SMOKE EVAC HIGH FLOW (TUBING) ×4 IMPLANT
SHEARS HARMONIC ACE PLUS 36CM (ENDOMECHANICALS) IMPLANT
SLEEVE ENDOPATH XCEL 5M (ENDOMECHANICALS) ×8 IMPLANT
SPECIMEN JAR SMALL (MISCELLANEOUS) ×4 IMPLANT
SPONGE GAUZE 2X2 STER 10/PKG (GAUZE/BANDAGES/DRESSINGS) ×2
STRIP CLOSURE SKIN 1/2X4 (GAUZE/BANDAGES/DRESSINGS) ×3 IMPLANT
SUT MNCRL AB 4-0 PS2 18 (SUTURE) ×4 IMPLANT
SUT VIC AB 0 UR5 27 (SUTURE) IMPLANT
SUT VICRYL 0 UR6 27IN ABS (SUTURE) IMPLANT
TOWEL GREEN STERILE FF (TOWEL DISPOSABLE) ×4 IMPLANT
TOWEL OR 17X24 6PK STRL BLUE (TOWEL DISPOSABLE) ×4 IMPLANT
TOWEL OR 17X26 10 PK STRL BLUE (TOWEL DISPOSABLE) ×4 IMPLANT
TRAY LAPAROSCOPIC MC (CUSTOM PROCEDURE TRAY) ×4 IMPLANT
TROCAR XCEL BLUNT TIP 100MML (ENDOMECHANICALS) ×4 IMPLANT
TROCAR XCEL NON-BLD 11X100MML (ENDOMECHANICALS) IMPLANT
TROCAR XCEL NON-BLD 5MMX100MML (ENDOMECHANICALS) ×4 IMPLANT
WATER STERILE IRR 1000ML POUR (IV SOLUTION) ×4 IMPLANT

## 2018-09-21 NOTE — Anesthesia Procedure Notes (Signed)
Procedure Name: Intubation Date/Time: 09/21/2018 1:45 PM Performed by: Renato Shin, CRNA Pre-anesthesia Checklist: Patient identified, Emergency Drugs available, Suction available and Patient being monitored Patient Re-evaluated:Patient Re-evaluated prior to induction Oxygen Delivery Method: Circle system utilized Preoxygenation: Pre-oxygenation with 100% oxygen Induction Type: IV induction Ventilation: Mask ventilation without difficulty Laryngoscope Size: Miller and 2 Grade View: Grade I Tube type: Oral Tube size: 7.0 mm Number of attempts: 1 Airway Equipment and Method: Stylet Placement Confirmation: ETT inserted through vocal cords under direct vision,  positive ETCO2 and breath sounds checked- equal and bilateral Secured at: 21 cm Tube secured with: Tape Dental Injury: Teeth and Oropharynx as per pre-operative assessment

## 2018-09-21 NOTE — Op Note (Signed)
09/16/2018 - 09/21/2018  3:44 PM  PATIENT:  [name]  53 y.o. female  PRE-OPERATIVE DIAGNOSIS:  abdominal pain; h/o laparoscopic roux en y gastric bypass  POST-OPERATIVE DIAGNOSIS:  Same, internal hernia due to adhesive band  PROCEDURE:  Procedure(s): LAPAROSCOPY DIAGNOSTIC, lysis of adhesive band  SURGEON:  Surgeon(s): Greer Pickerel, MD   ASSISTANTS: Johnathan Hausen MD FACS (assist requested to help identify anatomy and retract bowel )  ANESTHESIA:   general  DRAINS: none   LOCAL MEDICATIONS USED:  MARCAINE     SPECIMEN:  No Specimen  DISPOSITION OF SPECIMEN:  N/A  COUNTS:  YES  INDICATION FOR PROCEDURE: 53 year old African-American female was admitted over the weekend with epigastric and right upper quadrant pain for the past several days.  CT imaging was unremarkable and radiology did not feel she had an internal hernia.  She had a prior laparoscopic adjustable gastric band which was removed and converted to an antecolic antigastric Roux-en-Y gastric bypass at [facility] in December 2018.  She continued to have intermittent abdominal pain but not as bad.  Gastroenterology was consulted and she underwent an upper endoscopy to rule out a marginal ulcer.  Her pouch was normal.  There was no evidence of ulcer.  Her pouch was approximately 4 cm in size.  The anastomosis was patent.  She continued to have some vague right-sided pain.  We had a long discussion regarding differential diagnoses.  Upon my review of the CT scan I thought there could be a swirl of vessels concerning for an internal hernia.  She had no evidence of cholelithiasis.  And she had no evidence of biliary dyskinesia on a nuclear medicine scan done 5 years ago.  Nonetheless we did discuss potential cholecystectomy if we did not find anything of note during the diagnostic laparoscopy.  I recommended a diagnostic laparoscopy since she was having ongoing discomfort.  We discussed that if we did not find an  internal hernia or an adhesive band causing an intermittent obstruction then I would recommend proceeding with cholecystectomy.  We discussed the possibility that cholecystectomy may not ameliorate her pain.  She like to proceed  PROCEDURE: After obtaining informed consent the patient was taken to the OR 1 at [facility] placed supine on the operative table.  General endotracheal anesthesia was established.  Sequential compression devices were placed.  Her arms were tucked at her side with the appropriate padding.  Her abdomen was prepped and draped in the usual standard surgical fashion.  Surgical timeout was performed.  The patient received IV antibiotic prior to skin incision.  I elected to gain access using the [facility] technique.  A small supraumbilical incision was made.  The fascia was grasped and incised.  A pursestring suture with 0 Vicryl was placed around the fascial edges.  A 12 mm trocar was placed and pneumoperitoneum was established.  The abdominal cavity was surveilled.  There was no evidence of injury to surrounding structures.  The patient became bradycardic and so pneumoperitoneum was released while anesthesia administered appropriate medication.  Once her heart rate recovered we reestablished pneumoperitoneum.  2 additional 5 mm trochars were placed in the right midabdomen and right lateral abdominal wall under direct visualization.  I then placed a 5 Miller trocar in the left mid abdomen through an old trocar site.  The Roux limb coming down from the gastric pouch was readily identified.  I initially started tracing the Roux limb distally toward the jejunojejunostomy.  It came over  the transverse colon but it appeared to be tethered and I could not run the Roux limb any further because it appeared to be tethered behind omental adhesion from the transverse colon to the root of the small bowel mesentery but it was unclear if that was the exact problem.  I then tried to lift up the  transverse colon to identify the ligament of Treitz.  Due to the previously mentioned adhesive band to the Roux the small bowel mesentery this was not possible to do.  I then decided to start running the small bowel from the terminal ileum back proximally.  We had to untucked her right arm since her peripheral IV infiltrated unfortunately.  Anesthesia was able to establish new peripheral IV.  At this point Dr. Hassell Done joining the operating room.  I had contacted him and requested his presents due to need for assistant for retraction of bowel and to help identify the anatomy.  We started the terminal ileum and started running back proximally.  We were eventually able to get to the jejunojejunostomy.  It appeared normal.  I lifted up the jejunojejunostomy and there is no evidence of mesenteric defect at the jejunojejunostomy.  We were then able to lyse the adhesion of the transverse colon epiploic appendage to the root of the small bowel mesentery with EndoShears.  We then ran the biliopancreatic limb from the jejunojejunostomy back toward the ligament of Treitz.  And identified the ligament of Treitz.  I then ran the Roux limb starting at the jejunojejunostomy back up to the gastric pouch.  It was no longer tethered.  I could easily manipulate it.  We then ran the Roux limb again starting the gastric pouch back down to the jejunojejunostomy.  It was no longer tethered.  I was able to easily run and all the way down to the jejunojejunostomy on like initially when I first attempted to run it.  It was no longer tethered.  There was no evidence of injury surrounding bowel.  Since I had found an adhesive band causing what appeared to be an intermittent obstruction I did not proceed with cholecystectomy since the patient does not have gallstones and her gallbladder appeared grossly normal intraoperatively.  Hassan trocar was removed.  Previously placed pursestring suture was tied down thus delivering the fascial defect.   There was no air leak and nothing trapped within the closure.  Local was infiltrated around the umbilicus.  Also infiltrated local along the right lateral abdominal wall.  Pneumoperitoneum was released.  Trochars were removed and skin incisions were closed with a 4 Monocryl in a subcuticular fashion followed by the application of benzoin, Steri-Strips and bandages.  All needle, instrument sponge counts were correct x2 there were no immediate complications.  PLAN OF CARE: Admit to inpatient   PATIENT DISPOSITION:  PACU - hemodynamically stable.   Delay start of Pharmacological VTE agent (>24hrs) due to surgical blood loss or risk of bleeding:  no  Leighton Ruff. Redmond Pulling, MD, FACS General, Bariatric, & Minimally Invasive Surgery [facility], [place]

## 2018-09-21 NOTE — Interval H&P Note (Signed)
History and Physical Interval Note:  09/21/2018 12:50 PM  [name]  has presented today for surgery, with the diagnosis of abdominal pain  The various methods of treatment have been discussed with the patient and family. After consideration of risks, benefits and other options for treatment, the patient has consented to  Procedure(s): LAPAROSCOPY DIAGNOSTIC (N/A) LAPAROSCOPIC CHOLECYSTECTOMY WITH INTRAOPERATIVE CHOLANGIOGRAM (N/A) as a surgical intervention .  The patient's history has been reviewed, patient examined, no change in status, stable for surgery.  I have reviewed the patient's chart and labs.  Questions were answered to the patient's satisfaction.    Leighton Ruff. Redmond Pulling, MD, FACS General, Bariatric, & Minimally Invasive Surgery [facility] [place]  Greer Pickerel

## 2018-09-21 NOTE — Anesthesia Preprocedure Evaluation (Addendum)
Anesthesia Evaluation  Patient identified by MRN, date of birth, ID band Patient awake    Reviewed: Allergy & Precautions, NPO status , Patient's Chart, lab work & pertinent test results  Airway Mallampati: I  TM Distance: >3 FB Neck ROM: Full    Dental no notable dental hx. (+) Dental Advisory Given, Missing,    Pulmonary    Pulmonary exam normal breath sounds clear to auscultation       Cardiovascular hypertension, Pt. on home beta blockers Normal cardiovascular exam+ dysrhythmias Atrial Fibrillation  Rhythm:Regular Rate:Normal  09/17/2018 EKG SB w R 44   Neuro/Psych negative neurological ROS  negative psych ROS   GI/Hepatic Neg liver ROS, GERD  ,  Endo/Other  Morbid obesity  Renal/GU negative Renal ROS  negative genitourinary   Musculoskeletal  (+) Arthritis ,   Abdominal (+) + obese,   Peds negative pediatric ROS (+)  Hematology  (+) anemia , Hgb 11.5   Anesthesia Other Findings   Reproductive/Obstetrics negative OB ROS                            Anesthesia Physical Anesthesia Plan  ASA: III  Anesthesia Plan: General   Post-op Pain Management:    Induction: Intravenous  PONV Risk Score and Plan: 4 or greater and Treatment may vary due to age or medical condition, Ondansetron and Dexamethasone  Airway Management Planned: Oral ETT  Additional Equipment:   Intra-op Plan:   Post-operative Plan: Extubation in OR  Informed Consent: I have reviewed the patients History and Physical, chart, labs and discussed the procedure including the risks, benefits and alternatives for the proposed anesthesia with the patient or authorized representative who has indicated his/her understanding and acceptance.     Dental advisory given  Plan Discussed with: CRNA  Anesthesia Plan Comments:         Anesthesia Quick Evaluation

## 2018-09-21 NOTE — Transfer of Care (Signed)
Immediate Anesthesia Transfer of Care Note  Patient: [name]  Procedure(s) Performed: LAPAROSCOPY DIAGNOSTIC (N/A Abdomen)  Patient Location: PACU  Anesthesia Type:General  Level of Consciousness: awake, drowsy and patient cooperative  Airway & Oxygen Therapy: Patient Spontanous Breathing and Patient connected to nasal cannula oxygen  Post-op Assessment: Report given to RN and Post -op Vital signs reviewed and stable  Post vital signs: Reviewed and stable  Last Vitals:  Vitals Value Taken Time  BP 123/82 09/21/2018  4:03 PM  Temp    Pulse 68 09/21/2018  4:05 PM  Resp 16 09/21/2018  4:05 PM  SpO2 100 % 09/21/2018  4:05 PM  Vitals shown include unvalidated device data.  Last Pain:  Vitals:   09/21/18 1132  TempSrc: Oral  PainSc:          Complications: No apparent anesthesia complications

## 2018-09-21 NOTE — Transfer of Care (Signed)
Immediate Anesthesia Transfer of Care Note  Patient: [name]  Procedure(s) Performed: ESOPHAGOGASTRODUODENOSCOPY (EGD) WITH PROPOFOL (N/A )  Patient Location: Endoscopy Unit  Anesthesia Type:MAC  Level of Consciousness: awake, alert  and oriented  Airway & Oxygen Therapy: Patient Spontanous Breathing and Patient connected to nasal cannula oxygen  Post-op Assessment: Report given to RN and Post -op Vital signs reviewed and stable  Post vital signs: Reviewed and stable  Last Vitals:  Vitals Value Taken Time  BP    Temp    Pulse    Resp    SpO2      Last Pain:  Vitals:   09/21/18 0752  TempSrc: Oral  PainSc: 0-No pain         Complications: No apparent anesthesia complications

## 2018-09-21 NOTE — Anesthesia Postprocedure Evaluation (Signed)
Anesthesia Post Note  Patient: [name]  Procedure(s) Performed: ESOPHAGOGASTRODUODENOSCOPY (EGD) WITH PROPOFOL (N/A )     Patient location during evaluation: PACU Anesthesia Type: MAC Level of consciousness: awake and alert Pain management: pain level controlled Vital Signs Assessment: post-procedure vital signs reviewed and stable Respiratory status: spontaneous breathing, nonlabored ventilation and respiratory function stable Cardiovascular status: stable and blood pressure returned to baseline Postop Assessment: no apparent nausea or vomiting Anesthetic complications: no    Last Vitals:  Vitals:   09/21/18 0826 09/21/18 1027  BP: (!) 177/96 (!) 162/101  Pulse:    Resp:    Temp:    SpO2:      Last Pain:  Vitals:   09/21/18 0752  TempSrc: Oral  PainSc: 0-No pain                 Lynda Rainwater

## 2018-09-22 ENCOUNTER — Encounter (HOSPITAL_COMMUNITY): Payer: Self-pay | Admitting: General Surgery

## 2018-09-22 MED ORDER — OXYCODONE HCL 5 MG PO TABS: 5.0000 mg | ORAL_TABLET | Freq: Four times a day (QID) | ORAL | 0 refills | Status: DC | PRN

## 2018-09-22 MED ORDER — TAB-A-VITE/IRON PO TABS
1.0000 | ORAL_TABLET | Freq: Every day | ORAL | Status: DC
  Administered 2018-09-22: 1 via ORAL
  Filled 2018-09-22: qty 1

## 2018-09-22 MED ORDER — ACETAMINOPHEN 325 MG PO TABS: 650.0000 mg | ORAL_TABLET | Freq: Four times a day (QID) | ORAL | Status: DC | PRN

## 2018-09-22 MED ORDER — POLYETHYLENE GLYCOL 3350 17 G PO PACK: 17.0000 g | PACK | Freq: Every day | ORAL | 0 refills | Status: DC | PRN

## 2018-09-22 NOTE — Discharge Instructions (Signed)
[facility] ° °Please arrive at least 30 min before your appointment to complete your check in paperwork.  If you are unable to arrive 30 min prior to your appointment time we may have to cancel or reschedule you. °LAPAROSCOPIC SURGERY: POST OP INSTRUCTIONS °Always review your discharge instruction sheet given to you by the facility where your surgery was performed. °IF YOU HAVE DISABILITY OR FAMILY LEAVE FORMS, YOU MUST BRING THEM TO THE OFFICE FOR PROCESSING.   °DO NOT GIVE THEM TO YOUR DOCTOR. ° °PAIN CONTROL ° °1. First take acetaminophen (Tylenol) AND/or ibuprofen (Advil) to control your pain after surgery.  Follow directions on package.  Taking acetaminophen (Tylenol) and/or ibuprofen (Advil) regularly after surgery will help to control your pain and lower the amount of prescription pain medication you may need.  You should not take more than 4,000 mg (4 grams) of acetaminophen (Tylenol) in 24 hours.  You should not take ibuprofen (Advil), aleve, motrin, naprosyn or other NSAIDS if you have a history of stomach ulcers or chronic kidney disease.  °2. A prescription for pain medication may be given to you upon discharge.  Take your pain medication as prescribed, if you still have uncontrolled pain after taking acetaminophen (Tylenol) or ibuprofen (Advil). °3. Use ice packs to help control pain. °4. If you need a refill on your pain medication, please contact your pharmacy.  They will contact our office to request authorization. Prescriptions will not be filled after 5pm or on week-ends. ° °HOME MEDICATIONS °5. Take your usually prescribed medications unless otherwise directed. ° °DIET °6. You should follow a light diet the first few days after arrival home.  Be sure to include lots of fluids daily. Avoid fatty, fried foods.  ° °CONSTIPATION °7. It is common to experience some constipation after surgery and if you are taking pain medication.  Increasing fluid intake and taking a stool  softener (such as Colace) will usually help or prevent this problem from occurring.  A mild laxative (Milk of Magnesia or Miralax) should be taken according to package instructions if there are no bowel movements after 48 hours. ° °WOUND/INCISION CARE °8. Most patients will experience some swelling and bruising in the area of the incisions.  Ice packs will help.  Swelling and bruising can take several days to resolve.  °9. Unless discharge instructions indicate otherwise, follow guidelines below  °a. STERI-STRIPS - you may remove your outer bandages 48 hours after surgery, and you may shower at that time.  You have steri-strips (small skin tapes) in place directly over the incision.  These strips should be left on the skin for 7-10 days.   °b. DERMABOND/SKIN GLUE - you may shower in 24 hours.  The glue will flake off over the next 2-3 weeks. °10. Any sutures or staples will be removed at the office during your follow-up visit. ° °ACTIVITIES °11. You may resume regular (light) daily activities beginning the next day--such as daily self-care, walking, climbing stairs--gradually increasing activities as tolerated.  You may have sexual intercourse when it is comfortable.  Refrain from any heavy lifting or straining until approved by your doctor. °a. You may drive when you are no longer taking prescription pain medication, you can comfortably wear a seatbelt, and you can safely maneuver your car and apply brakes. ° °FOLLOW-UP °12. You should see your doctor in the office for a follow-up appointment approximately 2-3 weeks after your surgery.  You should have been given your post-op/follow-up appointment when   your surgery was scheduled.  If you did not receive a post-op/follow-up appointment, make sure that you call for this appointment within a day or two after you arrive home to insure a convenient appointment time. ° °OTHER INSTRUCTIONS ° °WHEN TO CALL YOUR DOCTOR: °1. Fever over 101.0 °2. Inability to  urinate °3. Continued bleeding from incision. °4. Increased pain, redness, or drainage from the incision. °5. Increasing abdominal pain ° °The clinic staff is available to answer your questions during regular business hours.  Please don’t hesitate to call and ask to speak to one of the nurses for clinical concerns.  If you have a medical emergency, go to the nearest emergency room or call 911.  A surgeon from [facility] is always on call at the hospital. °[address], [address], [place], [place]  [address] ? [address] [address], [place], [place]   [address] °[phone] ? [phone] ? FAX [phone] ° ° ° °

## 2018-09-22 NOTE — Progress Notes (Signed)
Patient back from surgery. Alert and oriented, ambulating , and denies pain. States that she is going home tomorrow.

## 2018-09-22 NOTE — Discharge Summary (Signed)
[facility] Discharge Summary   [name] MRN: [id number] DOB/AGE: 05/07/1966 53 y.o.  Admit date: 09/16/2018 Discharge date: 09/22/2018  Admitting Diagnosis: Abdominal pain s/p gastric bypass  Discharge Diagnosis Patient Active Problem List   Diagnosis Date Noted  . Abdominal pain 09/16/2018  . Sleep apnea 07/14/2016  . Primary osteoarthritis of right knee 06/26/2016  . Compartment syndrome of lower extremity ([place]) 08/13/2014  . Atrial fibrillation ([place])   . H/O blood clots   . Anemia   . Hypertension   . Obesity   . Nodule of right lung 05/05/2012  . GERD (gastroesophageal reflux disease) 11/26/2011  . Lap band APS, 02/18/2009 11/13/2011  . Environmental allergies 11/11/2011  . Anxiety 09/03/2011  . Depressive disorder, not elsewhere classified 10/11/2008  . Gastro-esophageal reflux disease with esophagitis 10/11/2008    Consultants Gastroenterology  Imaging: No results found.  Procedures Dr. Benson Norway (09/20/18) - Upper endoscopy Dr. Redmond Pulling (09/21/18) - LAPAROSCOPY DIAGNOSTIC, lysis of adhesive band  Hospital Course:  [name] is a 53yo female history of band converted to bypass at [facility], a fib on eliquis, who presented to [facility] 2/7 with a few days of RUQ/epigastric pain.  U/s showed dilated common bile duct, no gallstones. LFTs WNL. CT scan shows some remnant stomach distension, mesenteric edema, no definite swirling. Gastroenterology was consulted and performed upper endoscopy 2/11; this was negative for ulcer. She underwent diagnostic laparoscopy on 2/12. Intraoperatively she was found to have an internal hernia due to adhesive band; this was lysed. Tolerated procedure well and was transferred to the floor.  Diet was advanced as tolerated.  On POD1 the patient was voiding well, tolerating diet, ambulating well, pain well controlled, vital signs stable, incisions c/d/i and felt stable for discharge home.  Patient will follow up as below  and knows to call with questions or concerns.    I have personally reviewed the patients medication history on the [place] controlled substance database.    Physical Exam: General:  Alert, NAD, pleasant, comfortable Pulm: effort normal Abd:  Soft, ND, nontender, multiple lap incisions C/D/I without erythema or drainage, +BS  Allergies as of 09/22/2018      Reactions   Flagyl [metronidazole] Anaphylaxis   Levaquin [levofloxacin] Other (See Comments)   Pains and aches throughout body, pt states cant move well      Medication List    STOP taking these medications   albuterol (2.5 MG/3ML) 0.083% nebulizer solution Commonly known as:  PROVENTIL   docusate sodium 100 MG capsule Commonly known as:  COLACE   gabapentin 300 MG capsule Commonly known as:  NEURONTIN   meclizine 25 MG tablet Commonly known as:  ANTIVERT   Potassium Chloride ER 20 MEQ Tbcr     TAKE these medications   acetaminophen 325 MG tablet Commonly known as:  TYLENOL Take 2 tablets (650 mg total) by mouth every 6 (six) hours as needed for moderate pain.   CALCIUM PO Take 1 tablet by mouth daily.   CARTIA XT 180 MG 24 hr capsule Generic drug:  diltiazem Take 180 mg by mouth daily.   DEXILANT 60 MG capsule Generic drug:  dexlansoprazole Take 60 mg by mouth daily.   ELIQUIS 5 MG Tabs tablet Generic drug:  apixaban Take 5 mg by mouth 2 (two) times daily.   escitalopram 20 MG tablet Commonly known as:  LEXAPRO Take 20 mg by mouth daily.   famotidine 40 MG tablet Commonly known as:  PEPCID Take 40  mg by mouth daily.   flecainide 150 MG tablet Commonly known as:  TAMBOCOR Take 150 mg by mouth 2 (two) times daily.   metoprolol succinate 50 MG 24 hr tablet Commonly known as:  TOPROL-XL Take 50 mg by mouth daily. Take with or immediately following a meal.   multivitamin with minerals tablet Take 1 tablet by mouth daily.   oxyCODONE 5 MG immediate release tablet Commonly known as:  Oxy  IR/ROXICODONE Take 1 tablet (5 mg total) by mouth every 6 (six) hours as needed for severe pain.   polyethylene glycol packet Commonly known as:  MIRALAX / GLYCOLAX Take 17 g by mouth daily as needed for moderate constipation.   valsartan 320 MG tablet Commonly known as:  DIOVAN Take 320 mg by mouth daily.        Follow-up Information    Greer Pickerel, MD. Call.   Specialty:  General Surgery Why:  We are working on your appointment, please call to confirm. Please arrive 30 minutes prior to your appointment to check in and fill out paperwork. Bring photo ID and insurance information. Contact information: [place] [address] [phone]        Fanny Bien, MD. Call.   Specialty:  Family Medicine Why:  call to arrange follow up with your PCP to discuss high blood pressures Contact information: [place] [address] [phone]           Signed: Wellington Hampshire, [facility] 09/22/2018, 9:43 AM Pager: [phone] Mon-Thurs 7:00 am-4:30 pm Fri 7:00 am -11:30 AM Sat-Sun 7:00 am-11:30 am

## 2018-09-22 NOTE — Progress Notes (Signed)
Patient states she had a good night, woke up with a higher level of pain. Oxy given on 3rd (see MAR). Patient showered independently and waiting for discharge orders.

## 2018-09-22 NOTE — Anesthesia Postprocedure Evaluation (Signed)
Anesthesia Post Note  Patient: [name]  Procedure(s) Performed: LAPAROSCOPY DIAGNOSTIC (N/A Abdomen)     Patient location during evaluation: PACU Anesthesia Type: General Level of consciousness: awake and alert Pain management: pain level controlled Vital Signs Assessment: post-procedure vital signs reviewed and stable Respiratory status: spontaneous breathing, nonlabored ventilation, respiratory function stable and patient connected to nasal cannula oxygen Cardiovascular status: blood pressure returned to baseline and stable Postop Assessment: no apparent nausea or vomiting Anesthetic complications: no    Last Vitals:  Vitals:   09/22/18 0400 09/22/18 0739  BP: 138/68 (!) 146/96  Pulse:  67  Resp:    Temp: 36.4 C 37.2 C  SpO2:  93%    Last Pain:  Vitals:   09/22/18 1100  TempSrc:   PainSc: 6    Pain Goal:                   Barnet Glasgow

## 2018-10-03 DIAGNOSIS — I10 Essential (primary) hypertension: Secondary | ICD-10-CM | POA: Diagnosis not present

## 2018-10-03 DIAGNOSIS — K219 Gastro-esophageal reflux disease without esophagitis: Secondary | ICD-10-CM | POA: Diagnosis not present

## 2018-10-03 DIAGNOSIS — R109 Unspecified abdominal pain: Secondary | ICD-10-CM | POA: Diagnosis not present

## 2018-10-26 ENCOUNTER — Other Ambulatory Visit: Payer: Self-pay

## 2018-10-26 ENCOUNTER — Other Ambulatory Visit (HOSPITAL_COMMUNITY): Payer: Self-pay | Admitting: Orthopedic Surgery

## 2018-10-26 ENCOUNTER — Ambulatory Visit (HOSPITAL_COMMUNITY)
Admission: RE | Admit: 2018-10-26 | Discharge: 2018-10-26 | Disposition: A | Discharge status: Home / Self Care | Payer: BLUE CROSS/BLUE SHIELD | Source: Ambulatory Visit | Attending: Orthopedic Surgery | Admitting: Orthopedic Surgery

## 2018-10-26 DIAGNOSIS — M79605 Pain in left leg: Secondary | ICD-10-CM | POA: Diagnosis not present

## 2018-10-26 DIAGNOSIS — M7989 Other specified soft tissue disorders: Secondary | ICD-10-CM

## 2018-10-26 DIAGNOSIS — M25562 Pain in left knee: Secondary | ICD-10-CM | POA: Diagnosis not present

## 2018-10-26 NOTE — Progress Notes (Signed)
Left lower extremity venous duplex has been completed. Preliminary results can be found in CV Proc through chart review.  Results were given to [place] at Dr. Debroah Loop office.   10/26/18 2:30 PM [name] RVT

## 2018-11-02 DIAGNOSIS — I10 Essential (primary) hypertension: Secondary | ICD-10-CM | POA: Diagnosis not present

## 2018-11-02 DIAGNOSIS — F411 Generalized anxiety disorder: Secondary | ICD-10-CM | POA: Diagnosis not present

## 2018-11-02 DIAGNOSIS — Z719 Counseling, unspecified: Secondary | ICD-10-CM | POA: Diagnosis not present

## 2018-11-02 DIAGNOSIS — F331 Major depressive disorder, recurrent, moderate: Secondary | ICD-10-CM | POA: Diagnosis not present

## 2018-12-29 ENCOUNTER — Other Ambulatory Visit: Payer: Self-pay | Admitting: Cardiology

## 2019-01-11 DIAGNOSIS — M25562 Pain in left knee: Secondary | ICD-10-CM | POA: Diagnosis not present

## 2019-02-14 ENCOUNTER — Ambulatory Visit: Payer: Self-pay | Admitting: *Deleted

## 2019-02-14 ENCOUNTER — Telehealth: Payer: Self-pay | Admitting: Family Medicine

## 2019-02-14 DIAGNOSIS — Z20822 Contact with and (suspected) exposure to covid-19: Secondary | ICD-10-CM

## 2019-02-14 DIAGNOSIS — R51 Headache: Secondary | ICD-10-CM | POA: Diagnosis not present

## 2019-02-14 DIAGNOSIS — Z0389 Encounter for observation for other suspected diseases and conditions ruled out: Secondary | ICD-10-CM | POA: Diagnosis not present

## 2019-02-14 DIAGNOSIS — R197 Diarrhea, unspecified: Secondary | ICD-10-CM | POA: Diagnosis not present

## 2019-02-14 NOTE — Telephone Encounter (Signed)
Pt scheduled at [facility] site tomorrow. Requested by Dr. Rachell Cipro. Pt with symptoms.  Pts CB# [phone] Practice # [phone] FBX [phone]  Testing process reviewed, stay in car, wear mask; pt verbalizes understanding.

## 2019-02-14 NOTE — Telephone Encounter (Signed)
Summary: COVID testing   Sabrina from Dr. Murvin Natal office calling to get pt tested for COVID because of symptoms.  Pt can be reached at [phone]

## 2019-02-14 NOTE — Telephone Encounter (Signed)
Pt scheduled for covid testing. See encounter

## 2019-02-15 ENCOUNTER — Other Ambulatory Visit: Payer: BLUE CROSS/BLUE SHIELD

## 2019-02-15 DIAGNOSIS — Z20822 Contact with and (suspected) exposure to covid-19: Secondary | ICD-10-CM

## 2019-02-15 DIAGNOSIS — R6889 Other general symptoms and signs: Secondary | ICD-10-CM | POA: Diagnosis not present

## 2019-02-21 LAB — NOVEL CORONAVIRUS, NAA: SARS-CoV-2, NAA: NOT DETECTED

## 2019-03-14 DIAGNOSIS — R51 Headache: Secondary | ICD-10-CM | POA: Diagnosis not present

## 2019-03-14 DIAGNOSIS — R5383 Other fatigue: Secondary | ICD-10-CM | POA: Diagnosis not present

## 2019-03-14 DIAGNOSIS — J3089 Other allergic rhinitis: Secondary | ICD-10-CM | POA: Diagnosis not present

## 2019-03-14 DIAGNOSIS — J329 Chronic sinusitis, unspecified: Secondary | ICD-10-CM | POA: Diagnosis not present

## 2019-03-21 DIAGNOSIS — R5383 Other fatigue: Secondary | ICD-10-CM | POA: Diagnosis not present

## 2019-03-21 DIAGNOSIS — I10 Essential (primary) hypertension: Secondary | ICD-10-CM | POA: Diagnosis not present

## 2019-03-21 DIAGNOSIS — G4700 Insomnia, unspecified: Secondary | ICD-10-CM | POA: Diagnosis not present

## 2019-03-29 IMAGING — CT CT ABD-PELV W/ CM
2 of 5 series · 17 of 46 positions shown, 19 images · IV contrast (omnipaque)
Comparison: None.

CLINICAL DATA: Right-sided abdominal pain and nausea.

EXAM:
CT ABDOMEN AND PELVIS WITH CONTRAST
TECHNIQUE: Multidetector CT imaging of the abdomen and pelvis was performed
using the standard protocol following bolus administration of
intravenous contrast.
CONTRAST:  100mL OMNIPAQUE IOHEXOL 300 MG/ML  SOLN

[Series 3: abd/ pelvis 5.0 i30f 2 · axial · 0.98mm/px · z∈[+792,+1227]mm · 14 of 97 slices shown, 16 images]
[im 5/97  soft-tissue]
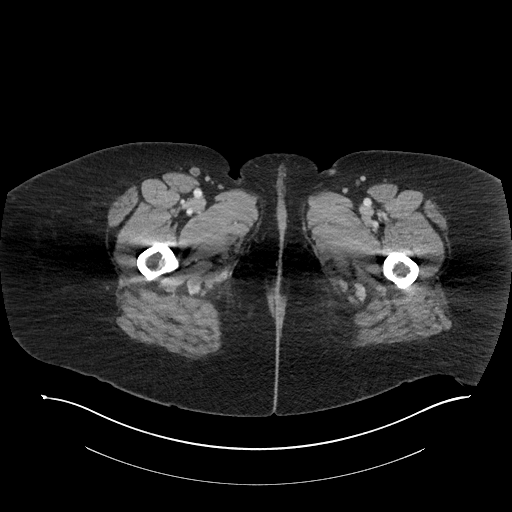
[im 5/97  bone]
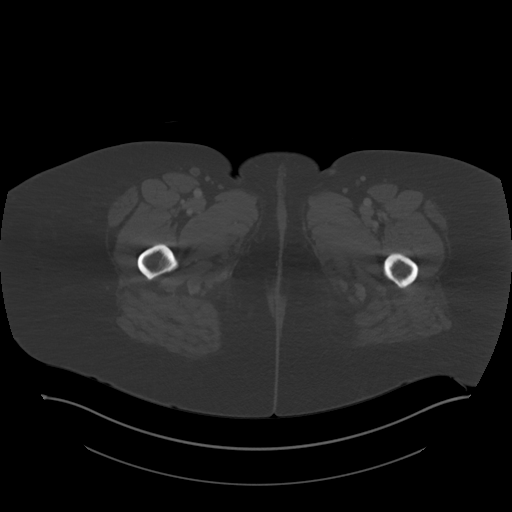
[im 15/97  soft-tissue]
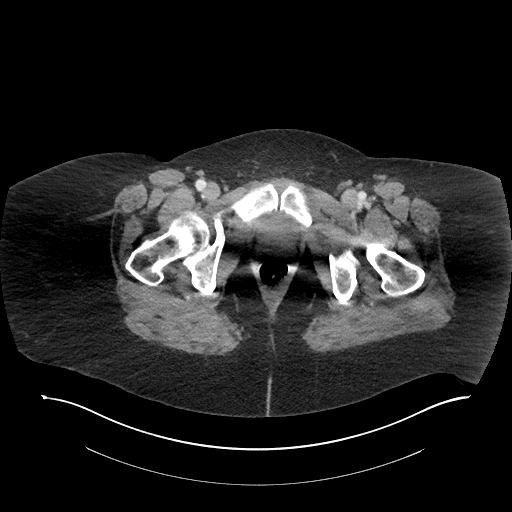
[im 20/97  soft-tissue]
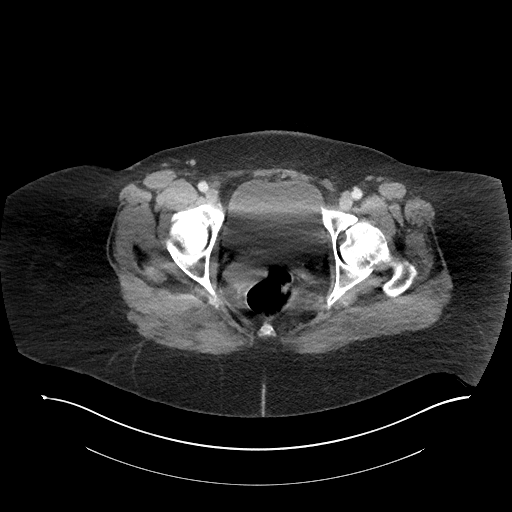
[im 25/97  soft-tissue]
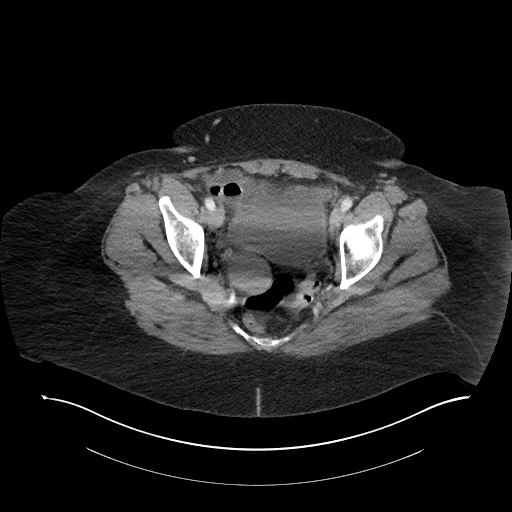
[im 34/97  soft-tissue]
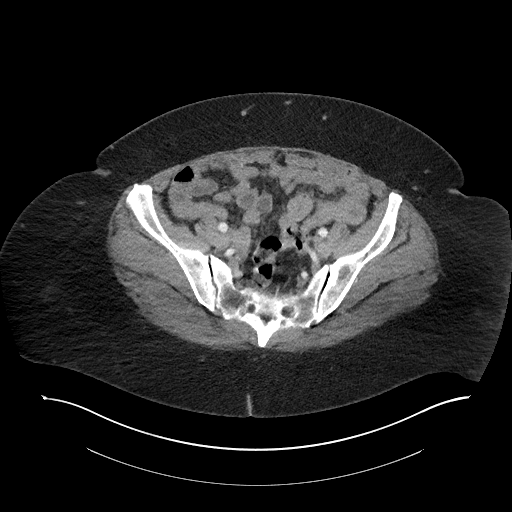
[im 39/97  soft-tissue]
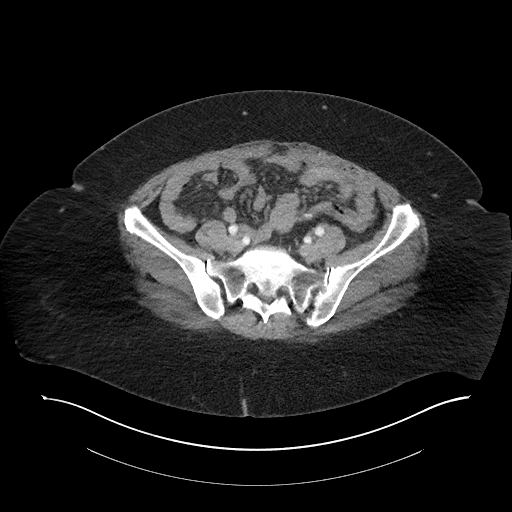
[im 44/97  soft-tissue]
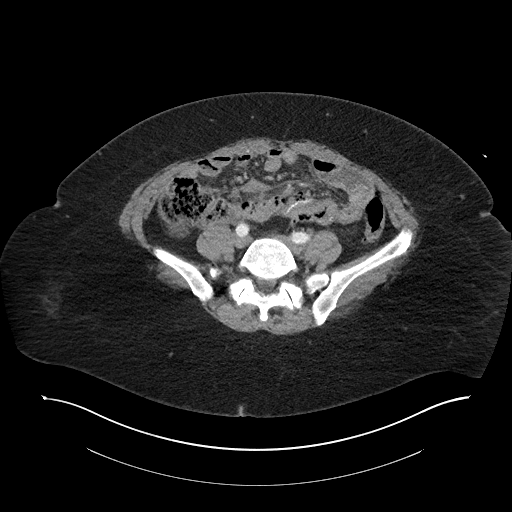
[im 53/97  soft-tissue]
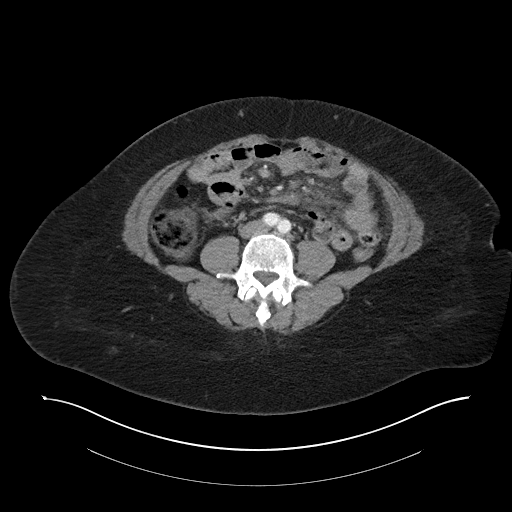
[im 58/97  soft-tissue]
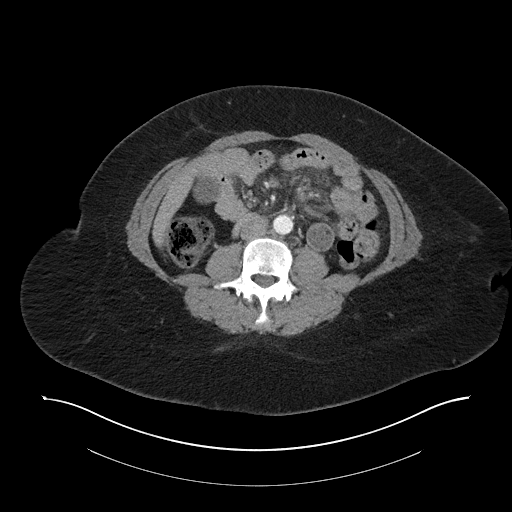
[im 58/97  bone]
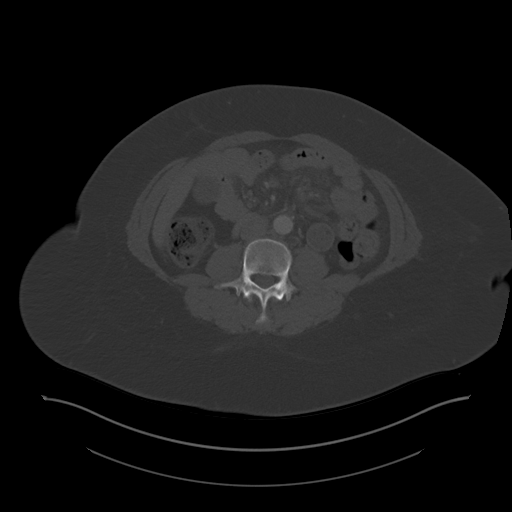
[im 63/97  soft-tissue]
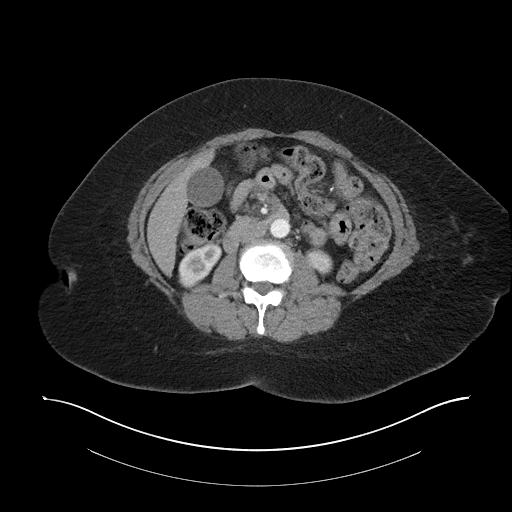
[im 73/97  soft-tissue]
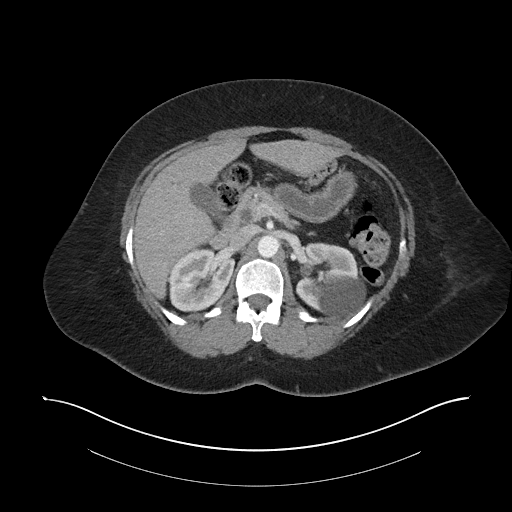
[im 77/97  soft-tissue]
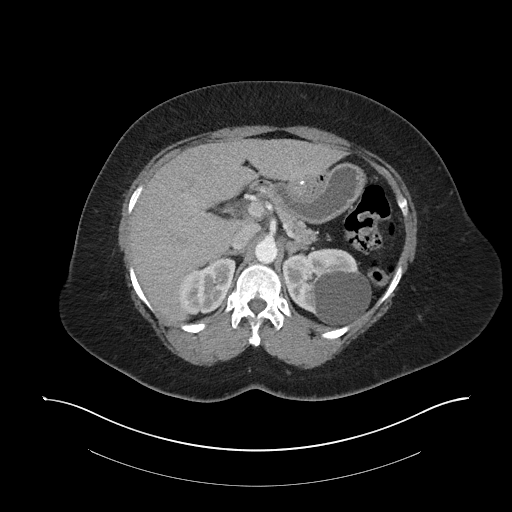
[im 82/97  soft-tissue]
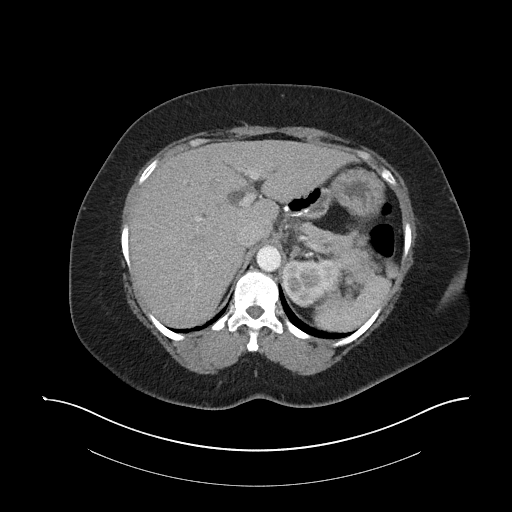
[im 92/97  soft-tissue]
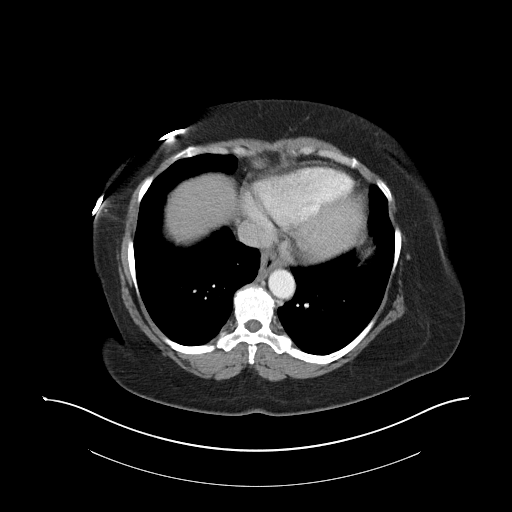

[Series 6: coronal soft tissue · coronal · 0.94mm/px · 3 of 119 slices shown]
[im 40/119  soft-tissue]
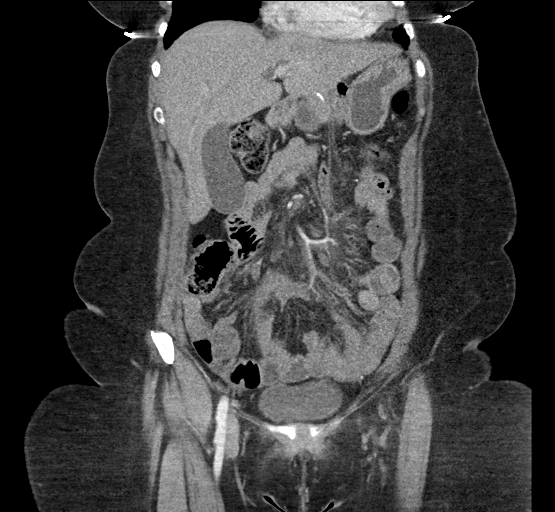
[im 53/119  soft-tissue]
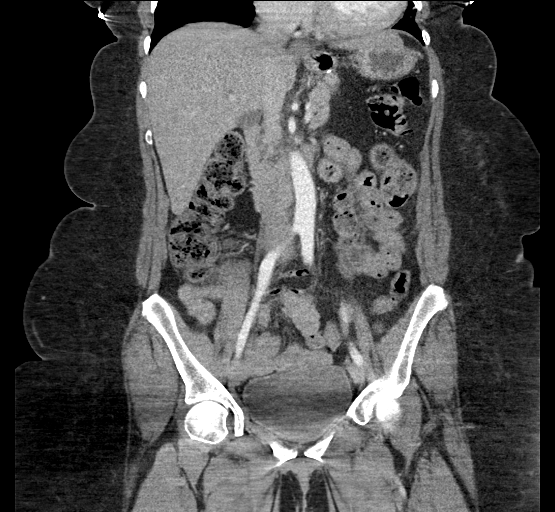
[im 66/119  soft-tissue]
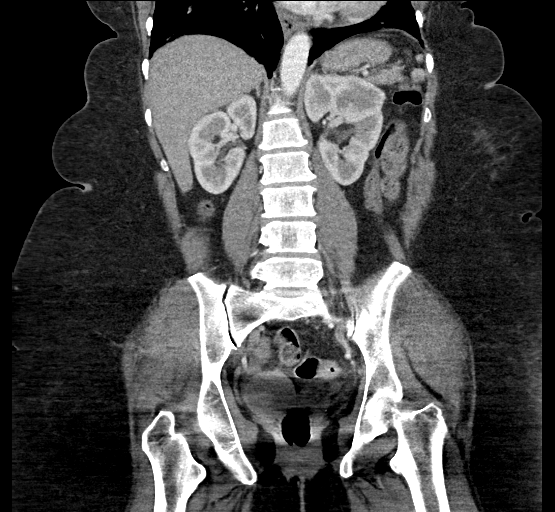

[17 of 46 positions shown; findings below may reference images not displayed]

FINDINGS: Lower chest: Normal heart size. Lung bases are clear. No pleural
effusion.

Hepatobiliary: Liver is normal in size and contour. Gallbladder is
unremarkable. No intrahepatic or extrahepatic biliary ductal
dilatation.

Pancreas: Unremarkable

Spleen: Unremarkable

Adrenals/Urinary Tract: Normal adrenal glands. Kidneys enhance
symmetrically with contrast. There is a 5.6 cm cyst interpolar
region left kidney.

Stomach/Bowel: Prior gastric bypass procedure. No abnormal bowel
wall thickening or evidence for bowel obstruction. Normal appendix.
Soft tissue stranding and small amount of fluid within the small
bowel mesentery (image 43; series 6).

Vascular/Lymphatic: Normal caliber abdominal aorta. No
retroperitoneal lymphadenopathy.

Reproductive: Unremarkable

Other: None.

Musculoskeletal: Lower thoracic and lumbar spine degenerative
changes. No aggressive or acute appearing osseous lesions.
IMPRESSION: Nonspecific mesenteric fat stranding, potentially
infectious/inflammatory in etiology.

No evidence for bowel obstruction.

## 2019-04-26 DIAGNOSIS — Z23 Encounter for immunization: Secondary | ICD-10-CM | POA: Diagnosis not present

## 2019-05-01 DIAGNOSIS — M25561 Pain in right knee: Secondary | ICD-10-CM | POA: Diagnosis not present

## 2019-05-01 DIAGNOSIS — M25562 Pain in left knee: Secondary | ICD-10-CM | POA: Diagnosis not present

## 2019-05-02 DIAGNOSIS — F331 Major depressive disorder, recurrent, moderate: Secondary | ICD-10-CM | POA: Diagnosis not present

## 2019-05-02 DIAGNOSIS — I10 Essential (primary) hypertension: Secondary | ICD-10-CM | POA: Diagnosis not present

## 2019-05-02 DIAGNOSIS — F320 Major depressive disorder, single episode, mild: Secondary | ICD-10-CM | POA: Diagnosis not present

## 2019-05-02 DIAGNOSIS — M1711 Unilateral primary osteoarthritis, right knee: Secondary | ICD-10-CM | POA: Diagnosis not present

## 2019-05-23 ENCOUNTER — Other Ambulatory Visit: Payer: Self-pay

## 2019-05-23 ENCOUNTER — Ambulatory Visit (INDEPENDENT_AMBULATORY_CARE_PROVIDER_SITE_OTHER): Payer: BC Managed Care – PPO | Admitting: Cardiology

## 2019-05-23 ENCOUNTER — Encounter: Payer: Self-pay | Admitting: Cardiology

## 2019-05-23 VITALS — BP 146/95 | HR 60 | Temp 98.0°F | Ht 69.0 in | Wt 277.0 lb

## 2019-05-23 DIAGNOSIS — I480 Paroxysmal atrial fibrillation: Secondary | ICD-10-CM

## 2019-05-23 DIAGNOSIS — E66813 Obesity, class 3: Secondary | ICD-10-CM

## 2019-05-23 DIAGNOSIS — I10 Essential (primary) hypertension: Secondary | ICD-10-CM

## 2019-05-23 DIAGNOSIS — Z6841 Body Mass Index (BMI) 40.0 and over, adult: Secondary | ICD-10-CM

## 2019-05-23 MED ORDER — SPIRONOLACTONE 50 MG PO TABS: 50.0000 mg | ORAL_TABLET | ORAL | 2 refills | Status: DC

## 2019-05-23 NOTE — Progress Notes (Signed)
Primary Physician/Referring:  Fanny Bien, MD  [name], female    DOB: July 06, 1966, 53 y.o.   MRN: [id number]  Chief Complaint  Patient presents with  . Atrial Fibrillation  . Follow-up   HPI:    [name]  is a 53 y.o.  African-American female with morbid obesity, history of gastric banding in 2011, due to failure of the same, underwent gastric bypass surgery in Sept 2018, paroxysmal atrial fibrillation, hypertension, morbid obesity, OSA on CPAP.  She presents here for a one-year visit, no new symptoms, had an accidental fall a week to 10 days ago.  She is having some mild knee pain since then.  Otherwise no specific complaints.  States that recently her blood pressure has been uncontrolled.  She has not been compliant with CPAP recently as well.  Past Medical History:  Diagnosis Date  . Adenomyosis 7/07  . Anemia   . Arthritis   . Atrial fibrillation ([place])   . Bleeding from the nose   . Bronchitis   . Dysrhythmia   . GERD (gastroesophageal reflux disease)   . H/O blood clots    superficial blood clots with OCPs  . Hypertension   . Obesity   . Sinus congestion    saw ENT, had nasal surgery to clean out nasal area for bacteria, mold  . Uterine fibroid   . Vitamin D deficiency    Past Surgical History:  Procedure Laterality Date  . ABDOMINAL HYSTERECTOMY    . BREAST BIOPSY     x2  . CESAREAN SECTION    . ESOPHAGOGASTRODUODENOSCOPY N/A 06/15/2016   Procedure: ESOPHAGOGASTRODUODENOSCOPY (EGD);  Surgeon: Alphonsa Overall, MD;  Location: [facility];  Service: General;  Laterality: N/A;  . ESOPHAGOGASTRODUODENOSCOPY (EGD) WITH PROPOFOL N/A 09/20/2018   Procedure: ESOPHAGOGASTRODUODENOSCOPY (EGD) WITH PROPOFOL;  Surgeon: Carol Ada, MD;  Location: [facility];  Service: Endoscopy;  Laterality: N/A;  . LAPAROSCOPIC GASTRIC BANDING  2010  . LAPAROSCOPIC GASTRIC BANDING  2011  . LAPAROSCOPY     x2  . LAPAROSCOPY N/A 09/21/2018   Procedure:  LAPAROSCOPY DIAGNOSTIC;  Surgeon: Greer Pickerel, MD;  Location: [facility];  Service: General;  Laterality: N/A;  . MOUTH SURGERY    . NASAL SINUS SURGERY  4/13  . PARTIAL KNEE ARTHROPLASTY Right 06/26/2016   Procedure: RIGHT UNICOMPARTMENTAL KNEE CODYLE AND PLATEAU MEDIAL COMPARTMENT;  Surgeon: Renette Butters, MD;  Location: [facility];  Service: Orthopedics;  Laterality: Right;  . ROUX-EN-Y GASTRIC BYPASS  2018   Social History   Socioeconomic History  . Marital status: Divorced    Spouse name: Not on file  . Number of children: 2  . Years of education: MASTERS  . Highest education level: Not on file  Occupational History    Employer: [employer]  . Financial resource strain: Not on file  . Food insecurity    Worry: Not on file    Inability: Not on file  . Transportation needs    Medical: Not on file    Non-medical: Not on file  Tobacco Use  . Smoking status: Never Smoker  . Smokeless tobacco: Never Used  Substance and Sexual Activity  . Alcohol use: Yes    Alcohol/week: 1.0 - 2.0 standard drinks    Types: 1 - 2 Standard drinks or equivalent per week    Comment: "periodically" 1 or twice a week a glass of wine  . Drug use: No  . Sexual activity: Never  Partners: Male    Birth control/protection: Surgical    Comment: TAH  Lifestyle  . Physical activity    Days per week: Not on file    Minutes per session: Not on file  . Stress: Not on file  Relationships  . Social Herbalist on phone: Not on file    Gets together: Not on file    Attends religious service: Not on file    Active member of club or organization: Not on file    Attends meetings of clubs or organizations: Not on file    Relationship status: Not on file  . Intimate partner violence    Fear of current or ex partner: Not on file    Emotionally abused: Not on file    Physically abused: Not on file    Forced sexual activity: Not on file  Other Topics Concern  . Not on file   Social History Narrative   Patient is divorced with 2 children.   Patient is right handed.   Patient has her Master's degree.   Patient drinks 1-2 sodas daily.       ROS  Review of Systems  Constitution: Negative for chills, decreased appetite, malaise/fatigue and weight gain.  Cardiovascular: Negative for dyspnea on exertion, leg swelling and syncope.  Respiratory: Positive for snoring (on CPAP).   Endocrine: Negative for cold intolerance.  Hematologic/Lymphatic: Does not bruise/bleed easily.  Musculoskeletal: Positive for falls (accidental fall last week) and joint pain (bilateral knee). Negative for joint swelling.  Gastrointestinal: Negative for abdominal pain, anorexia, change in bowel habit, hematochezia and melena.  Neurological: Negative for headaches and light-headedness.  Psychiatric/Behavioral: Negative for depression and substance abuse.  All other systems reviewed and are negative.  Objective   Vitals with BMI 05/23/2019 09/22/2018 09/22/2018  Height 5\' 9"  - -  Weight 277 lbs - -  BMI A999333 - -  Systolic 123456 123456 0000000  Diastolic 95 96 68  Pulse 60 67 -    Blood pressure (!) 146/95, pulse 60, temperature 98 F (36.7 C), height 5\' 9"  (1.753 m), weight 277 lb (125.6 kg), last menstrual period 08/10/2005, SpO2 98 %. Body mass index is 40.91 kg/m.   Physical Exam  Constitutional: She appears well-developed. No distress.  Morbidly obese  HENT:  Head: Atraumatic.  Eyes: Conjunctivae are normal.  Neck: Neck supple. No thyromegaly present.  Short neck and difficult to evaluate JVP  Cardiovascular: Normal rate, regular rhythm and normal heart sounds. Exam reveals no gallop.  No murmur heard. Pulses:      Carotid pulses are 2+ on the right side and 2+ on the left side.      Dorsalis pedis pulses are 2+ on the right side and 2+ on the left side.       Posterior tibial pulses are 2+ on the right side and 2+ on the left side.  Femoral and popliteal pulse difficult to feel  due to patient's body habitus.   Pulmonary/Chest: Effort normal and breath sounds normal.  Abdominal: Soft. Bowel sounds are normal.  Obese. Pannus present  Musculoskeletal: Normal range of motion.        General: No edema.  Neurological: She is alert.  Skin: Skin is warm and dry.  Psychiatric: She has a normal mood and affect.   Radiology: No results found.  Laboratory examination:   Recent Labs    09/17/18 0628 09/19/18 0443 09/20/18 0441  NA 140 141 141  K 3.8 3.4* 3.7  CL 106 106 105  CO2 29 29 28   GLUCOSE 93 103* 99  BUN 6 6 <5*  CREATININE 0.73 0.88 0.81  CALCIUM 8.6* 8.7* 9.1  GFRNONAA >60 >60 >60  GFRAA >60 >60 >60   CMP Latest Ref Rng & Units 09/20/2018 09/19/2018 09/17/2018  Glucose 70 - 99 mg/dL 99 103(H) 93  BUN 6 - 20 mg/dL <5(L) 6 6  Creatinine 0.44 - 1.00 mg/dL 0.81 0.88 0.73  Sodium 135 - 145 mmol/L 141 141 140  Potassium 3.5 - 5.1 mmol/L 3.7 3.4(L) 3.8  Chloride 98 - 111 mmol/L 105 106 106  CO2 22 - 32 mmol/L 28 29 29   Calcium 8.9 - 10.3 mg/dL 9.1 8.7(L) 8.6(L)  Total Protein 6.5 - 8.1 g/dL 6.2(L) 6.2(L) 6.6  Total Bilirubin 0.3 - 1.2 mg/dL 0.3 0.6 0.7  Alkaline Phos 38 - 126 U/L 82 85 91  AST 15 - 41 U/L 16 13(L) 18  ALT 0 - 44 U/L 12 12 13    CBC Latest Ref Rng & Units 09/20/2018 09/19/2018 09/17/2018  WBC 4.0 - 10.5 K/uL 4.5 4.3 4.3  Hemoglobin 12.0 - 15.0 g/dL 11.5(L) 11.0(L) 10.7(L)  Hematocrit 36.0 - 46.0 % 37.1 35.5(L) 35.0(L)  Platelets 150 - 400 K/uL 292 269 260   Lipid Panel  No results found for: CHOL, TRIG, HDL, CHOLHDL, VLDL, LDLCALC, LDLDIRECT HEMOGLOBIN A1C No results found for: HGBA1C, MPG TSH No results for input(s): TSH in the last 8760 hours. Medications and allergies   Allergies  Allergen Reactions  . Flagyl [Metronidazole] Anaphylaxis  . Levaquin [Levofloxacin] Other (See Comments)    Pains and aches throughout body, pt states cant move well     Prior to Admission medications   Medication Sig Start Date End Date Taking?  Authorizing Provider  apixaban (ELIQUIS) 5 MG TABS tablet Take 5 mg by mouth 2 (two) times daily.   Yes [provider]  CALCIUM PO Take 1 tablet by mouth daily.   Yes [provider]  CARTIA XT 180 MG 24 hr capsule Take 180 mg by mouth daily. 05/03/14  Yes [provider]  escitalopram (LEXAPRO) 20 MG tablet Take 20 mg by mouth daily.   Yes [provider]  famotidine (PEPCID) 40 MG tablet Take 40 mg by mouth daily.   Yes [provider]  flecainide (TAMBOCOR) 150 MG tablet Take 150 mg by mouth 2 (two) times daily.   Yes [provider]  metoprolol succinate (TOPROL-XL) 50 MG 24 hr tablet Take 50 mg by mouth daily. Take with or immediately following a meal.   Yes [provider]  Multiple Vitamins-Minerals (MULTIVITAMIN WITH MINERALS) tablet Take 1 tablet by mouth daily.    Yes [provider]  acetaminophen (TYLENOL) 325 MG tablet Take 2 tablets (650 mg total) by mouth every 6 (six) hours as needed for moderate pain. Patient not taking: Reported on 05/23/2019 09/22/18   Wellington Hampshire, PA-C     Current Outpatient Medications  Medication Instructions  . acetaminophen (TYLENOL) 650 mg, Oral, Every 6 hours PRN  . apixaban (ELIQUIS) 5 mg, Oral, 2 times daily  . CALCIUM PO 1 tablet, Oral, Daily  . diltiazem (CARDIZEM CD) 360 MG 24 hr capsule 1 capsule, Oral, Daily  . escitalopram (LEXAPRO) 20 mg, Daily  . famotidine (PEPCID) 40 mg, Oral, Daily  . flecainide (TAMBOCOR) 150 mg, Oral, 2 times daily  . hydrochlorothiazide (HYDRODIURIL) 12.5 MG tablet 1 tablet, Oral, Daily  . metoprolol succinate (TOPROL-XL) 50 mg,  Oral, Daily, Take with or immediately following a meal.   . Multiple Vitamins-Minerals (MULTIVITAMIN WITH MINERALS) tablet 1 tablet, Oral, Daily    Cardiac Studies:   Lexiscan Sestamibi stress test 05/11/2014: 1. The resting electrocardiogram demonstrated normal sinus rhythm and normal resting conduction.  The  stress electrocardiogram was non-diagnostic due to low heart rate.  pharmacologic stress test. 2. The perfusion imaging study demonstrates the left ventricle to be mildly dilated with a left ventricular end-diastolic volume of A999333 mL.  Otherwise there is no evidence of ischemia, normal wall motion, LVEF 55%.  This represents a low risk study.  Echocardiogram 05/09/2014: Left ventricle cavity is normal in size. Mild concentric hypertrophy of the left ventricle. Normal global wall motion. Normal diastolic filling pattern. Calculated EF 64%. Insignificant pericardial effusion with clear fluids.  Sleep study 06/13/2014: Moderate obstructive sleep apnea, successfully treated with CPAP. Dr. Rexene Alberts   Assessment     ICD-10-CM   1. Paroxysmal atrial fibrillation (HCC)  I48.0 EKG 12-Lead   CHA2DS2-VASc Score is 2 with yearly risk of stroke of 2.2 %.  2. Essential hypertension  I10   3. Class 3 severe obesity due to excess calories with serious comorbidity and body mass index (BMI) of 40.0 to 44.9 in adult [facility])  E66.01    Z68.41     EKG 05/23/2019: Normal sinus rhythm at rate of 57 bpm, biatrial abnormality, normal axis.  Poor R-wave progression, probably normal variant.  Normal QT interval.  No evidence of ischemia. No significant change from   EKG 06/03/2018, previously LAE only.    EKG 05/03/2014: Atypical Atrial flutter with variable ventricular response, 110 bpm.  Normal axis, borderline low voltage complexes, nonspecific T abnormality.  Recommendations:   Patient here on a one-year follow-up visit for atrial fibrillation, blood pressure recently has been elevated, probably related to the fact that she has not been using her CPAP on a regular basis.  Importance of using CPAP discussed with the patient.  I'll start her on spironolactone 50 mg daily, she has an appointment to see her PCP "Dr. Ernie Hew" this week, she will need labs specifically BMP.  Patient would prefer to obtain his with her.  I have  discussed with her regarding weight loss again.  Previously she weight 265 pounds,  she is now weighing 277 pounds today.  With regard to atrial fibrillation, she is maintaining sinus rhythm on flecainide, continue the same.  EKG does reveal right atrial enlargement that is new, probably related to obesity and she may be developing mild chronic cor pulmonale.  No clinical evidence of heart failure, no change in physical exam otherwise, I'll see her back in one year.  Adrian Prows, MD, [facility] 05/23/2019, 3:19 PM [facility] [place] Pager: [phone] Office: [phone] If no answer Cell [phone]

## 2019-05-25 DIAGNOSIS — G4700 Insomnia, unspecified: Secondary | ICD-10-CM | POA: Diagnosis not present

## 2019-05-25 DIAGNOSIS — F331 Major depressive disorder, recurrent, moderate: Secondary | ICD-10-CM | POA: Diagnosis not present

## 2019-05-25 DIAGNOSIS — I10 Essential (primary) hypertension: Secondary | ICD-10-CM | POA: Diagnosis not present

## 2019-05-25 DIAGNOSIS — F411 Generalized anxiety disorder: Secondary | ICD-10-CM | POA: Diagnosis not present

## 2019-05-29 DIAGNOSIS — S8002XA Contusion of left knee, initial encounter: Secondary | ICD-10-CM | POA: Diagnosis not present

## 2019-05-29 DIAGNOSIS — S8001XA Contusion of right knee, initial encounter: Secondary | ICD-10-CM | POA: Diagnosis not present

## 2019-06-13 DIAGNOSIS — I10 Essential (primary) hypertension: Secondary | ICD-10-CM | POA: Diagnosis not present

## 2019-06-14 ENCOUNTER — Other Ambulatory Visit: Payer: Self-pay | Admitting: Cardiology

## 2019-06-14 DIAGNOSIS — I480 Paroxysmal atrial fibrillation: Secondary | ICD-10-CM

## 2019-06-15 DIAGNOSIS — I10 Essential (primary) hypertension: Secondary | ICD-10-CM | POA: Diagnosis not present

## 2019-06-15 DIAGNOSIS — F411 Generalized anxiety disorder: Secondary | ICD-10-CM | POA: Diagnosis not present

## 2019-06-15 DIAGNOSIS — G4700 Insomnia, unspecified: Secondary | ICD-10-CM | POA: Diagnosis not present

## 2019-06-15 DIAGNOSIS — K219 Gastro-esophageal reflux disease without esophagitis: Secondary | ICD-10-CM | POA: Diagnosis not present

## 2019-07-20 ENCOUNTER — Other Ambulatory Visit: Payer: Self-pay | Admitting: Cardiology

## 2019-07-30 ENCOUNTER — Other Ambulatory Visit: Payer: Self-pay | Admitting: Cardiology

## 2019-08-01 DIAGNOSIS — I4891 Unspecified atrial fibrillation: Secondary | ICD-10-CM | POA: Diagnosis not present

## 2019-08-01 DIAGNOSIS — R635 Abnormal weight gain: Secondary | ICD-10-CM | POA: Diagnosis not present

## 2019-08-01 DIAGNOSIS — I10 Essential (primary) hypertension: Secondary | ICD-10-CM | POA: Diagnosis not present

## 2019-08-15 ENCOUNTER — Other Ambulatory Visit: Payer: Self-pay | Admitting: Cardiology

## 2019-08-15 NOTE — Telephone Encounter (Signed)
Okay to refill? 

## 2019-08-29 ENCOUNTER — Telehealth: Payer: Self-pay | Admitting: Obstetrics & Gynecology

## 2019-08-29 NOTE — Telephone Encounter (Signed)
Left message to call Unice Vantassel, RN at [facility] [phone].   

## 2019-08-29 NOTE — Telephone Encounter (Signed)
Patient is having menopausal issues she would like to discuss with Dr Sabra Heck. Last seen 04/05/17.

## 2019-08-30 NOTE — Telephone Encounter (Signed)
Patient returned call. Please call cell number.

## 2019-08-30 NOTE — Telephone Encounter (Signed)
Left message for pt to call back to triage RN.  

## 2019-08-30 NOTE — Telephone Encounter (Signed)
Pt returned call. Pt states having menopausal sx and would like to discuss this with Dr Sabra Heck to get a plan for HRT. Pt scheduled web visit with Dr Sabra Heck on 09/01/2019 at 800 am. Pt verbalized understanding and agreeable.   Will route to Dr Sabra Heck for review and will close encounter.

## 2019-09-01 ENCOUNTER — Other Ambulatory Visit: Payer: Self-pay

## 2019-09-01 ENCOUNTER — Telehealth (INDEPENDENT_AMBULATORY_CARE_PROVIDER_SITE_OTHER): Payer: 59 | Admitting: Obstetrics & Gynecology

## 2019-09-01 ENCOUNTER — Encounter: Payer: Self-pay | Admitting: Obstetrics & Gynecology

## 2019-09-01 DIAGNOSIS — Z658 Other specified problems related to psychosocial circumstances: Secondary | ICD-10-CM

## 2019-09-01 DIAGNOSIS — N951 Menopausal and female climacteric states: Secondary | ICD-10-CM | POA: Diagnosis not present

## 2019-09-01 DIAGNOSIS — F419 Anxiety disorder, unspecified: Secondary | ICD-10-CM | POA: Diagnosis not present

## 2019-09-01 NOTE — Addendum Note (Signed)
Addended by: Megan Salon on: 09/01/2019 12:14 PM   Modules accepted: Level of Service

## 2019-09-01 NOTE — Progress Notes (Addendum)
Virtual Visit via Video Note  I connected with [name] on 09/01/19 at  8:00 AM EST by a video enabled telemedicine application and verified that I am speaking with the correct person using two identifiers.  Location: Patient: home Provider: office   I discussed the limitations of evaluation and management by telemedicine and the availability of in person appointments. The patient expressed understanding and agreed to proceed.  History of Present Illness: 89 G2P1 MAAF to discuss symptoms that she is concerning for menopause.  She's having anxiety and issues with losing things.  She is having insomnia as well.  She is using xanax for sleep and situational anxiety.  She does have some anxiety about her daughter at [facility] as her daughter is very involved in racial injustice issues.    Weight loss was down to 252 and she thinks she is at 280.  She was 312 with initial weight.  She knows she is eating and drinking some things that she should not be eating.  She feels she is consuming wine too much.    Was diagnosed with Afib since I've seen her last.  She is on Eliquis.  Briefly discussed she cannot use estrogens with afib and eliquis.  So, whatever treatment we decide to proceed with, it cannot be estrogens.  Pt voices understanding.  Pt is under a lot of stress at work, with family, with Covid, just seems like it's coming from all sources.  Has never seen a therapist.  Is open to this.   Observations/Objective: WNWD AAF, NAD  Assessment and Plan: Anxiety Forgetfulness Stressors Concerns all is related to menopause  Follow Up Instructions: She is going to have lab work with her PCP at [facility].  Outside orders placed for [facility] and [facility].  Will make plans about possible menopause treatment once lab work is back.  I really feel she needs to be working on some self care as well including therapy and possibly back with [facility] instead of an expensive program she is  considering.  She did take the name of the provider at [facility] that I think would be good to see.   I discussed the assessment and treatment plan with the patient. The patient was provided an opportunity to ask questions and all were answered. The patient agreed with the plan and demonstrated an understanding of the instructions.   The patient was advised to call back or seek an in-person evaluation if the symptoms worsen or if the condition fails to improve as anticipated.  I provided 30 minutes of non-face-to-face time during this encounter.   Megan Salon, MD

## 2019-09-02 LAB — FOLLICLE STIMULATING HORMONE: FSH: 53.3 m[IU]/mL

## 2019-09-02 LAB — ESTRADIOL: Estradiol: <5 pg/mL

## 2019-09-04 ENCOUNTER — Other Ambulatory Visit: Payer: Self-pay | Admitting: Obstetrics & Gynecology

## 2019-09-04 ENCOUNTER — Encounter: Payer: Self-pay | Admitting: Obstetrics & Gynecology

## 2019-09-04 ENCOUNTER — Telehealth: Payer: Self-pay | Admitting: Obstetrics & Gynecology

## 2019-09-04 NOTE — Telephone Encounter (Signed)
Reviewed MyChart visit and results dated 09/01/19.   Routing to Dr. Sabra Heck to advise on Wellbutrin Rx.

## 2019-09-04 NOTE — Telephone Encounter (Signed)
Patient sent the following correspondence through [facility].  Dr. Sabra Heck,  Thanks for your note. Let's start the Wellbutrin. You can send 90 days to [facility] or I can get it in the mail from [place]. I think my insurance will allow a 30 day dosage for me to try it. I have my physical tomorrow and I had a bad fall on my knee so I have ortho appt too. Sounds like the Wellbutrin will ensure I do not drink any alcohol. Thank you.  I look forward to meeting the other Doctor. Thank you

## 2019-09-06 MED ORDER — ESCITALOPRAM OXALATE 20 MG PO TABS: 30.0000 mg | ORAL_TABLET | Freq: Every day | ORAL | 0 refills | Status: DC

## 2019-09-06 NOTE — Telephone Encounter (Signed)
Spoke with patient, advised per Dr. Sabra Heck. Patient agreeable to proceed with Lexapro 30mg  daily. Rx for Lexapro 20 mg tab, take 1.5 tabs daily #45/0RF. Patient will provider update to Dr. Sabra Heck in 2-3wks. Provided contact information for Dr. Albertine Patricia, patient will call to schedule. Patient verbalizes understanding and is agreeable.   Routing to provider for final review. Patient is agreeable to disposition. Will close encounter.

## 2019-09-06 NOTE — Telephone Encounter (Signed)
Can you please let pt know the wellbutrin she and I discussed has a drug interaction with her flecainide.  Together these two medication can cause a cardiac issue called torsades de points which can cause ventricular tachycardia and sudden death.  I double checked this with cardiology and we cannot use the wellbutrin.  I think we should increase hte lexapro to 30mg  daily and it is ok to send in this rx.  Also, she may need a second SSRI or a tricyclic medication but this is outside my comfort level with these medications.  I think we may need to get a psychiatrist involved with her care.  I cannot refer her to a psychiatrist but can made some recommendations.  I would recommend starting with [facility], Dr. Pearson Grippe.  I would really encourage [name] to call as the first appointment will likely be a few weeks away.  With the increase in the lexapro to 30mg , we should plan to touch base again in 2 to 3 weeks to see if this is helping.

## 2019-09-13 ENCOUNTER — Encounter: Payer: Self-pay | Admitting: Obstetrics & Gynecology

## 2019-10-02 ENCOUNTER — Telehealth: Payer: Self-pay | Admitting: *Deleted

## 2019-10-02 DIAGNOSIS — E669 Obesity, unspecified: Secondary | ICD-10-CM

## 2019-10-02 NOTE — Telephone Encounter (Signed)
-----   Message from Megan Salon, MD sent at 09/27/2019  1:25 PM EST ----- Regarding: my chart message Can you call this pt and let her know I sent her a Mychart message after our recent telehealth visit?  Here is the message.  Here is the counseling information from Dr. Juleen China. [email or web address]  (I asked for therapists from Dr. Juleen China who helps pt with eating issues as well as emotional issues).   Also, I wanted to know if Dr. Alcario Drought office had called her yet?  I don't seen an appt in [place].  I will communicate with her if it hasn't happened.  Thanks.  Vinnie Level

## 2019-10-02 NOTE — Telephone Encounter (Signed)
Have communicated with Dr. Juleen China via MyChart about appt for pt.  She indicated she could expedite an appt.  No need to communicate with pt regarding this.

## 2019-10-02 NOTE — Telephone Encounter (Signed)
Spoke with patient. Advised per Dr. Sabra Heck.   1. Patient states Dr. Albertine Wilcox not currently accepting new patients, she will contact [facility]. Patient thankful for information.  2. Referral placed to Dr. Juleen China. Advised patient she will be contacted by our referral coordinator with appt details once scheduled.   Patient verbalizes understanding and is agreeable.   Routing to provider for final review. Patient is agreeable to disposition. Will close encounter.  Cc: [name]

## 2019-10-17 ENCOUNTER — Encounter (INDEPENDENT_AMBULATORY_CARE_PROVIDER_SITE_OTHER): Payer: Self-pay | Admitting: Family Medicine

## 2019-10-17 ENCOUNTER — Ambulatory Visit (INDEPENDENT_AMBULATORY_CARE_PROVIDER_SITE_OTHER): Payer: 59 | Admitting: Family Medicine

## 2019-10-17 ENCOUNTER — Other Ambulatory Visit: Payer: Self-pay

## 2019-10-17 VITALS — BP 103/67 | HR 52 | Temp 97.9°F | Ht 69.0 in | Wt 281.0 lb

## 2019-10-17 DIAGNOSIS — F329 Major depressive disorder, single episode, unspecified: Secondary | ICD-10-CM | POA: Diagnosis not present

## 2019-10-17 DIAGNOSIS — R5383 Other fatigue: Secondary | ICD-10-CM

## 2019-10-17 DIAGNOSIS — Z9884 Bariatric surgery status: Secondary | ICD-10-CM

## 2019-10-17 DIAGNOSIS — F419 Anxiety disorder, unspecified: Secondary | ICD-10-CM

## 2019-10-17 DIAGNOSIS — R0602 Shortness of breath: Secondary | ICD-10-CM | POA: Diagnosis not present

## 2019-10-17 DIAGNOSIS — E559 Vitamin D deficiency, unspecified: Secondary | ICD-10-CM

## 2019-10-17 DIAGNOSIS — I480 Paroxysmal atrial fibrillation: Secondary | ICD-10-CM | POA: Diagnosis not present

## 2019-10-17 DIAGNOSIS — G4700 Insomnia, unspecified: Secondary | ICD-10-CM

## 2019-10-17 DIAGNOSIS — Z0289 Encounter for other administrative examinations: Secondary | ICD-10-CM

## 2019-10-17 DIAGNOSIS — Z9189 Other specified personal risk factors, not elsewhere classified: Secondary | ICD-10-CM | POA: Diagnosis not present

## 2019-10-17 DIAGNOSIS — Z6841 Body Mass Index (BMI) 40.0 and over, adult: Secondary | ICD-10-CM

## 2019-10-17 DIAGNOSIS — F32A Depression, unspecified: Secondary | ICD-10-CM

## 2019-10-17 NOTE — Progress Notes (Signed)
Dear Dr. Sabra Heck,   Thank you for referring [name] to our clinic. The following note includes my evaluation and treatment recommendations.  Chief Complaint:   OBESITY [name] (MR# [id number]) is a 54 y.o. female who presents for evaluation and treatment of obesity and related comorbidities. Current BMI is Body mass index is 41.5 kg/m. [name] has been struggling with her weight for many years and has been unsuccessful in either losing weight, maintaining weight loss, or reaching her healthy weight goal.  [name] is currently in the action stage of change and ready to dedicate time achieving and maintaining a healthier weight. [name] is interested in becoming our patient and working on intensive lifestyle modifications including (but not limited to) diet and exercise for weight loss.  [name] says she enjoys red meat.  She likes the [employer] greek salad.  She loves salad as long as she does not have to make it.  "I want to eat when I want to eat."  She says she has increased her wine intake during COVID.  She reports having a high stress job which seems to be worse when working from home and says she won't be in the office until 2022.  [name] for a promotion in the near future. She exercises using a recumbent bike.  She was previously walking her dog 2-3 miles a day.  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   [name]'s habits were reviewed today and are as follows: her desired weight loss is 85 pounds, she has been heavy most of her life, she started gaining weight after high school, her heaviest weight ever was 320 pounds, she is a picky eater, she craves salty foods, sunflower seeds, and meat, she snacks frequently in the evenings, she sometimes wakes up in the middle of the night to eat, she skips meals sometimes depending on her work schedule, she frequently makes poor food choices, she frequently eats larger portions than normal  and she struggles with emotional eating.  Depression Screen [name]'s Food and Mood (modified PHQ-9) score was 12.  Depression screen PHQ 2/9 10/17/2019  Decreased Interest 1  Down, Depressed, Hopeless 2  PHQ - 2 Score 3  Altered sleeping 1  Tired, decreased energy 1  Change in appetite 2  Feeling bad or failure about yourself  3  Trouble concentrating 1  Moving slowly or fidgety/restless 0  Suicidal thoughts 1  PHQ-9 Score 12  Difficult doing work/chores Somewhat difficult   Subjective:   1. Other fatigue [name] denies daytime somnolence and admits to waking up still tired. Patent has a history of symptoms of morning fatigue and snoring. [name] generally gets about 1-3 hours of sleep per night, and states that she has poor sleep quality. Snoring is present. Apneic episodes are present. Epworth Sleepiness Score is 2.  2. SOB (shortness of breath) on exertion [name] notes increasing shortness of breath with exercising and seems to be worsening over time with weight gain. She notes getting out of breath sooner with activity than she used to. This has gotten worse recently. [name] denies shortness of breath at rest or orthopnea.  3. Paroxysmal atrial fibrillation (HCC) [name] is taking Eliquis, metoprolol, diltiazem, valsartan, and spironolactone.  She is followed by Cardiology.  4. Vitamin D deficiency She is not currently taking vit D. She denies nausea, vomiting or muscle weakness.  5. Insomnia, unspecified type [name] is positive for sleep apnea.  She is not using a CPAP.  She snores and has apneic episodes.  Her Epworth score is 2.  6. History of gastric bypass [name] had gastric bypass about 3 years ago.  She said she gained during [name].  Taking [name]. Recently started their pouch reset plan.  7. Anxiety and depression, with emotional eating [name] is struggling with emotional eating and using food for comfort to the extent that it is negatively impacting her health.  She has been working on behavior modification techniques to help reduce her emotional eating and has been unsuccessful. She shows no sign of suicidal or homicidal ideations.  PHQ-9 is 12 today. Question 8: Patient has no active SI/HI. Started "[facility]." She was told to read 2 books: The body is not an apology, and Fat girls in black bodies. She should get them today.   8. At risk for malnutrition [name] is at increased risk for malnutrition due to her history of bariatric surgery.   Assessment/Plan:   1. Other fatigue [name] does feel that her weight is causing her energy to be lower than it should be. Fatigue may be related to obesity, depression or many other causes. Labs will be ordered, and in the meanwhile, [name] will focus on self care including making healthy food choices, increasing physical activity and focusing on stress reduction.  Orders - EKG 12-Lead - TSH - T4, free - T3  2. SOB (shortness of breath) on exertion [name] does feel that she gets out of breath more easily that she used to when she exercises. [name]'s shortness of breath appears to be obesity related and exercise induced. She has agreed to work on weight loss and gradually increase exercise to treat her exercise induced shortness of breath. Will continue to monitor closely.  Orders - CBC with Differential/Platelet  3. Paroxysmal atrial fibrillation ([place]) Followed by Cardiology for this problem. Those encounter notes were reviewed. Orders and follow up as documented in patient record.  4. Vitamin D deficiency Low Vitamin D level contributes to fatigue and are associated with obesity, breast, and colon cancer.  Orders - VITAMIN D 25 Hydroxy (Vit-D Deficiency, Fractures)  5. Insomnia, unspecified type The problem of recurrent insomnia was discussed. Orders and follow up as documented in patient record. Counseling: Intensive lifestyle modifications are the first line treatment for this issue. We  discussed several lifestyle modifications today and she will continue to work on diet, exercise and weight loss efforts.   Counseling  Limit or avoid alcohol, caffeinated beverages, and cigarettes, especially close to bedtime.   Do not eat a large meal or eat spicy foods right before bedtime. This can lead to digestive discomfort that can make it hard for you to sleep.  Keep a sleep diary to help you and your health care provider figure out what could be causing your insomnia.  . Make your bedroom a dark, comfortable place where it is easy to fall asleep. ? Put up shades or blackout curtains to block light from outside. ? Use a white noise machine to block noise. ? Keep the temperature cool. . Limit screen use before bedtime. This includes: ? Watching TV. ? Using your smartphone, tablet, or computer. . Stick to a routine that includes going to bed and waking up at the same times every day and night. This can help you fall asleep faster. Consider making a quiet activity, such as reading, part of your nighttime routine. . Try to avoid taking naps during the day  so that you sleep better at night. . Get out of bed if you are still awake after 15 minutes of trying to sleep. Keep the lights down, but try reading or doing a quiet activity. When you feel sleepy, go back to bed.  6. History of gastric bypass [name] is at risk for malnutrition due to her previous bariatric surgery.   Counseling  You may need to eat 3 meals and 2 snacks, or 5 small meals each day in order to reach your protein and calorie goals.   Allow at least 15 minutes for each meal so that you can eat mindfully. Listen to your body so that you do not overeat. For most people, your sleeve or pouch will comfortably hold 4-6 ounces.  Eat foods from all food groups. This includes fruits and vegetables, grains, dairy, and meat and other proteins.  Include a protein-rich food at every meal and snack, and eat the protein food first.    You should be taking a Bariatric Multivitamin as well as calcium.   Orders - Comprehensive metabolic panel - Hemoglobin A1c - Insulin, random - Anemia panel - Vitamin B1  7. Anxiety and depression, with emotional eating Behavior modification techniques were discussed today to help [name] deal with her emotional/non-hunger eating behaviors.  Orders and follow up as documented in patient record.   8. At risk for malnutrition [name] was given approximately 15 minutes of counseling today regarding prevention of malnutrition. [name] was advised that having bariatric surgery increases her risk for anemia, malnutrition, and vitamin deficiencies.   9. Class 3 severe obesity with serious comorbidity and body mass index (BMI) of 40.0 to 44.9 in adult, unspecified obesity type (HCC) [name] is currently in the action stage of change and her goal is to continue with weight loss efforts. I recommend [name] begin the structured treatment plan as follows:  She has agreed to the Category 3 Plan.  Exercise goals: As is.   Behavioral modification strategies: increasing lean protein intake, decreasing simple carbohydrates, increasing vegetables, increasing water intake, decreasing liquid calories and decreasing alcohol intake.  She was informed of the importance of frequent follow-up visits to maximize her success with intensive lifestyle modifications for her multiple health conditions. She was informed we would discuss her lab results at her next visit unless there is a critical issue that needs to be addressed sooner. [name] agreed to keep her next visit at the agreed upon time to discuss these results.  Objective:   Blood pressure 103/67, pulse (!) 52, temperature 97.9 F (36.6 C), temperature source Oral, height 5\' 9"  (1.753 m), weight 281 lb (127.5 kg), last menstrual period 08/10/2005, SpO2 98 %. Body mass index is 41.5 kg/m.  EKG: Normal sinus rhythm, rate 56 bpm.  Indirect Calorimeter completed  today shows a VO2 of 322 and a REE of 2252.  Her calculated basal metabolic rate is [id number] thus her basal metabolic rate is better than expected.  General: Cooperative, alert, well developed, in no acute distress. HEENT: Conjunctivae and lids unremarkable. Cardiovascular: Regular rhythm.  Lungs: Normal work of breathing. Neurologic: No focal deficits.   Lab Results  Component Value Date   CREATININE 0.81 09/20/2018   BUN <5 (L) 09/20/2018   NA 141 09/20/2018   K 3.7 09/20/2018   CL 105 09/20/2018   CO2 28 09/20/2018   Lab Results  Component Value Date   ALT 12 09/20/2018   AST 16 09/20/2018   ALKPHOS 82 09/20/2018   BILITOT 0.3 09/20/2018  Lab Results  Component Value Date   WBC 4.5 09/20/2018   HGB 11.5 (L) 09/20/2018   HCT 37.1 09/20/2018   MCV 88.5 09/20/2018   PLT 292 09/20/2018   Attestation Statements:   This is the patient's first visit at [facility]. The patient's NEW PATIENT PACKET was reviewed at length. Included in the packet: current and past health history, medications, allergies, ROS, gynecologic history (women only), surgical history, family history, social history, weight history, weight loss surgery history (for those that have had weight loss surgery), nutritional evaluation, mood and food questionnaire, PHQ9, Epworth questionnaire, sleep habits questionnaire, patient life and health improvement goals questionnaire. These will all be scanned into the patient's chart under media.   During the visit, I independently reviewed the patient's EKG, bioimpedance scale results, and indirect calorimeter results. I used this information to tailor a meal plan for the patient that will help her to lose weight and will improve her obesity-related conditions going forward. I performed a medically necessary appropriate examination and/or evaluation. I discussed the assessment and treatment plan with the patient. The patient was provided an opportunity to ask  questions and all were answered. The patient agreed with the plan and demonstrated an understanding of the instructions. Labs were ordered at this visit and will be reviewed at the next visit unless more critical results need to be addressed immediately. Clinical information was updated and documented in the EMR.   I, Water quality scientist, CMA, am acting as Location manager for [employer], DO.  I have reviewed the above documentation for accuracy and completeness, and I agree with the above. Briscoe Deutscher, DO

## 2019-10-21 LAB — CBC WITH DIFFERENTIAL/PLATELET
Basophils Absolute: 0.1 10*3/uL (ref 0.0–0.2)
Basos: 1 %
EOS (ABSOLUTE): 0.1 10*3/uL (ref 0.0–0.4)
Eos: 1 %
Hemoglobin: 12.6 g/dL (ref 11.1–15.9)
Immature Grans (Abs): 0 10*3/uL (ref 0.0–0.1)
Immature Granulocytes: 0 %
Lymphocytes Absolute: 1.4 10*3/uL (ref 0.7–3.1)
Lymphs: 24 %
MCH: 29.7 pg (ref 26.6–33.0)
MCHC: 32.6 g/dL (ref 31.5–35.7)
MCV: 91 fL (ref 79–97)
Monocytes Absolute: 0.6 10*3/uL (ref 0.1–0.9)
Monocytes: 10 %
Neutrophils Absolute: 3.7 10*3/uL (ref 1.4–7.0)
Neutrophils: 64 %
Platelets: 370 10*3/uL (ref 150–450)
RBC: 4.24 x10E6/uL (ref 3.77–5.28)
RDW: 13.1 % (ref 11.7–15.4)
WBC: 5.8 10*3/uL (ref 3.4–10.8)

## 2019-10-21 LAB — VITAMIN D 25 HYDROXY (VIT D DEFICIENCY, FRACTURES): Vit D, 25-Hydroxy: 37.3 ng/mL (ref 30.0–100.0)

## 2019-10-21 LAB — T3: T3, Total: 83 ng/dL (ref 71–180)

## 2019-10-21 LAB — ANEMIA PANEL
Ferritin: 24 ng/mL (ref 15–150)
Folate, Hemolysate: 250 ng/mL
Folate, RBC: 646 ng/mL (ref >498)
Hematocrit: 38.7 % (ref 34.0–46.6)
Iron Saturation: 16 % (ref 15–55)
Iron: 78 ug/dL (ref 27–159)
Retic Ct Pct: 1.5 % (ref 0.6–2.6)
Total Iron Binding Capacity: 473 ug/dL — ABNORMAL HIGH (ref 250–450)
UIBC: 395 ug/dL (ref 131–425)
Vitamin B-12: 366 pg/mL (ref 232–1245)

## 2019-10-21 LAB — COMPREHENSIVE METABOLIC PANEL
ALT: 22 IU/L (ref 0–32)
AST: 27 IU/L (ref 0–40)
Albumin/Globulin Ratio: 1.8 (ref 1.2–2.2)
Albumin: 4.4 g/dL (ref 3.8–4.9)
Alkaline Phosphatase: 130 IU/L — ABNORMAL HIGH (ref 39–117)
BUN/Creatinine Ratio: 19 (ref 9–23)
BUN: 16 mg/dL (ref 6–24)
Bilirubin Total: 0.4 mg/dL (ref 0.0–1.2)
CO2: 24 mmol/L (ref 20–29)
Calcium: 9.2 mg/dL (ref 8.7–10.2)
Chloride: 101 mmol/L (ref 96–106)
Creatinine, Ser: 0.85 mg/dL (ref 0.57–1.00)
GFR calc Af Amer: 90 mL/min/{1.73_m2} (ref >59)
GFR calc non Af Amer: 78 mL/min/{1.73_m2} (ref >59)
Globulin, Total: 2.5 g/dL (ref 1.5–4.5)
Glucose: 100 mg/dL — ABNORMAL HIGH (ref 65–99)
Potassium: 4.8 mmol/L (ref 3.5–5.2)
Sodium: 138 mmol/L (ref 134–144)
Total Protein: 6.9 g/dL (ref 6.0–8.5)

## 2019-10-21 LAB — VITAMIN B1: Thiamine: 112.7 nmol/L (ref 66.5–200.0)

## 2019-10-21 LAB — HEMOGLOBIN A1C
Est. average glucose Bld gHb Est-mCnc: 111 mg/dL
Hgb A1c MFr Bld: 5.5 % (ref 4.8–5.6)

## 2019-10-21 LAB — TSH: TSH: 1.58 u[IU]/mL (ref 0.450–4.500)

## 2019-10-21 LAB — T4, FREE: Free T4: 1.11 ng/dL (ref 0.82–1.77)

## 2019-10-21 LAB — INSULIN, RANDOM: INSULIN: 6.9 u[IU]/mL (ref 2.6–24.9)

## 2019-10-31 ENCOUNTER — Ambulatory Visit (INDEPENDENT_AMBULATORY_CARE_PROVIDER_SITE_OTHER): Payer: 59 | Admitting: Family Medicine

## 2019-10-31 ENCOUNTER — Other Ambulatory Visit: Payer: Self-pay

## 2019-10-31 ENCOUNTER — Encounter (INDEPENDENT_AMBULATORY_CARE_PROVIDER_SITE_OTHER): Payer: Self-pay | Admitting: Family Medicine

## 2019-10-31 VITALS — BP 113/73 | HR 56 | Temp 98.1°F | Ht 69.0 in | Wt 284.0 lb

## 2019-10-31 DIAGNOSIS — Z9189 Other specified personal risk factors, not elsewhere classified: Secondary | ICD-10-CM | POA: Diagnosis not present

## 2019-10-31 DIAGNOSIS — Z9884 Bariatric surgery status: Secondary | ICD-10-CM

## 2019-10-31 DIAGNOSIS — N951 Menopausal and female climacteric states: Secondary | ICD-10-CM | POA: Insufficient documentation

## 2019-10-31 DIAGNOSIS — G4733 Obstructive sleep apnea (adult) (pediatric): Secondary | ICD-10-CM

## 2019-10-31 DIAGNOSIS — Z6841 Body Mass Index (BMI) 40.0 and over, adult: Secondary | ICD-10-CM

## 2019-10-31 DIAGNOSIS — F3289 Other specified depressive episodes: Secondary | ICD-10-CM | POA: Diagnosis not present

## 2019-10-31 DIAGNOSIS — G4700 Insomnia, unspecified: Secondary | ICD-10-CM | POA: Diagnosis not present

## 2019-10-31 MED ORDER — TRAZODONE HCL 50 MG PO TABS: 25.0000 mg | ORAL_TABLET | Freq: Every evening | ORAL | 0 refills | Status: DC | PRN

## 2019-10-31 MED ORDER — BD PEN NEEDLE NANO 2ND GEN 32G X 4 MM MISC: 1.0000 | Freq: Every day | 0 refills | Status: DC

## 2019-10-31 MED ORDER — SAXENDA 18 MG/3ML ~~LOC~~ SOPN: 3.0000 mg | PEN_INJECTOR | Freq: Every day | SUBCUTANEOUS | 0 refills | Status: DC

## 2019-10-31 NOTE — Telephone Encounter (Signed)
FYI

## 2019-10-31 NOTE — Progress Notes (Signed)
Chief Complaint:   OBESITY [name] is here to discuss her progress with her obesity treatment plan along with follow-up of her obesity related diagnoses. [name] is on the Category 3 Plan and states she is following her eating plan approximately 40% of the time. [name] states she is walking for 30-45 minutes 2-3 times per week.  Today's visit was #: 2 Starting weight: 281 lbs Starting date: 10/17/2019 Today's weight: 284 lbs Today's date: 10/31/2019 Total lbs lost to date: 0 Total lbs lost since last in-office visit: 0  Interim History: [name] says she has had a busy week.  She has had lots of disappointments.  She is struggling with night eating and insomnia.  Subjective:   1. OSA (obstructive sleep apnea) [name] has a diagnosis of sleep apnea. She reports that she is not using a CPAP regularly. Her CPAP is being set up at this time.   2. Insomnia, falling and staying asleep [name] has insomnia and has considerable difficulty sleeping.  3. History of gastric bypass [name] had gastric bypass surgery in 2018.  4. Other depression, with emotional eating [name] is struggling with emotional eating and using food for comfort to the extent that it is negatively impacting her health. She has been working on behavior modification techniques to help reduce her emotional eating and has been unsuccessful. She shows no sign of suicidal or homicidal ideations.  5. At risk for nausea [name] is at risk for nausea due to new medications.  Assessment/Plan:   1. OSA (obstructive sleep apnea) Intensive lifestyle modifications are the first line treatment for this issue. We discussed several lifestyle modifications today and she will continue to work on diet, exercise and weight loss efforts. We will continue to monitor. Orders and follow up as documented in patient record.  She will start using her CPAP machine tonight.  Counseling  Sleep apnea is a condition in which breathing pauses or becomes  shallow during sleep. This happens over and over during the night. This disrupts your sleep and keeps your body from getting the rest that it needs, which can cause tiredness and lack of energy (fatigue) during the day.  Sleep apnea treatment: If you were given a device to open your airway while you sleep, USE IT!  Sleep hygiene:   Limit or avoid alcohol, caffeinated beverages, and cigarettes, especially close to bedtime.   Do not eat a large meal or eat spicy foods right before bedtime. This can lead to digestive discomfort that can make it hard for you to sleep.  Keep a sleep diary to help you and your health care provider figure out what could be causing your insomnia.  . Make your bedroom a dark, comfortable place where it is easy to fall asleep. ? Put up shades or blackout curtains to block light from outside. ? Use a white noise machine to block noise. ? Keep the temperature cool. . Limit screen use before bedtime. This includes: ? Watching TV. ? Using your smartphone, tablet, or computer. . Stick to a routine that includes going to bed and waking up at the same times every day and night. This can help you fall asleep faster. Consider making a quiet activity, such as reading, part of your nighttime routine. . Try to avoid taking naps during the day so that you sleep better at night. . Get out of bed if you are still awake after 15 minutes of trying to sleep. Keep the lights down, but try reading or  doing a quiet activity. When you feel sleepy, go back to bed.  2. Insomnia Will start [name] on trazodone, as below.  Orders - traZODone (DESYREL) 50 MG tablet; Take 0.5-1 tablets (25-50 mg total) by mouth at bedtime as needed for sleep.  Dispense: 30 tablet; Refill: 0  3. History of gastric bypass [name] is at risk for malnutrition due to her previous bariatric surgery.   Counseling  You may need to eat 3 meals and 2 snacks, or 5 small meals each day in order to reach your protein and  calorie goals.   Allow at least 15 minutes for each meal so that you can eat mindfully. Listen to your body so that you do not overeat. For most people, your sleeve or pouch will comfortably hold 4-6 ounces.  Eat foods from all food groups. This includes fruits and vegetables, grains, dairy, and meat and other proteins.  Include a protein-rich food at every meal and snack, and eat the protein food first.   You should be taking a Bariatric Multivitamin as well as calcium.   4. Other depression, with emotional eating Behavior modification techniques were discussed today to help [name] deal with her emotional/non-hunger eating behaviors.  Orders and follow up as documented in patient record.  Will start [name] on Saxenda for help with weight management.  5. At risk for nausea [name] was given approximately 15 minutes of nausea prevention counseling today. [name] is at risk for nausea due to her new or current medication. She was encouraged to titrate her medication slowly, make sure to stay hydrated, eat smaller portions throughout the day, and avoid high fat meals.   6. Class 3 severe obesity with serious comorbidity and body mass index (BMI) of 40.0 to 44.9 in adult, unspecified obesity type ([place]) After discussion, patient would like to start below medication. Expectations, risks, and potential side effects reviewed.   Orders - Liraglutide -Weight Management (SAXENDA) 18 MG/3ML SOPN; Inject 0.5 mLs (3 mg total) into the skin daily.  Dispense: 5 pen; Refill: 0 - Insulin Pen Needle (BD PEN NEEDLE NANO 2ND GEN) 32G X 4 MM MISC; 1 Package by Does not apply route daily.  Dispense: 100 each; Refill: 0  [name] is currently in the action stage of change. As such, her goal is to continue with weight loss efforts. She has agreed to the Category 3 Plan.   Exercise goals: As is.  Behavioral modification strategies: increasing lean protein intake.  [name] has agreed to follow-up with our clinic in  2 weeks. She was informed of the importance of frequent follow-up visits to maximize her success with intensive lifestyle modifications for her multiple health conditions.   Objective:   Blood pressure 113/73, pulse (!) 56, temperature 98.1 F (36.7 C), temperature source Oral, height 5\' 9"  (1.753 m), weight 284 lb (128.8 kg), last menstrual period 08/10/2005, SpO2 97 %. Body mass index is 41.94 kg/m.  General: Cooperative, alert, well developed, in no acute distress. HEENT: Conjunctivae and lids unremarkable. Cardiovascular: Regular rhythm.  Lungs: Normal work of breathing. Neurologic: No focal deficits.   Lab Results  Component Value Date   CREATININE 0.85 10/17/2019   BUN 16 10/17/2019   NA 138 10/17/2019   K 4.8 10/17/2019   CL 101 10/17/2019   CO2 24 10/17/2019   Lab Results  Component Value Date   ALT 22 10/17/2019   AST 27 10/17/2019   ALKPHOS 130 (H) 10/17/2019   BILITOT 0.4 10/17/2019   Lab  Results  Component Value Date   HGBA1C 5.5 10/17/2019   Lab Results  Component Value Date   INSULIN 6.9 10/17/2019   Lab Results  Component Value Date   TSH 1.580 10/17/2019   Lab Results  Component Value Date   WBC 5.8 10/17/2019   HGB 12.6 10/17/2019   HCT 38.7 10/17/2019   MCV 91 10/17/2019   PLT 370 10/17/2019   Lab Results  Component Value Date   IRON 78 10/17/2019   TIBC 473 (H) 10/17/2019   FERRITIN 24 10/17/2019   Attestation Statements:   Reviewed by clinician on day of visit: allergies, medications, problem list, medical history, surgical history, family history, social history, and previous encounter notes.  I, Water quality scientist, CMA, am acting as Location manager for [employer], DO.  I have reviewed the above documentation for accuracy and completeness, and I agree with the above. Briscoe Deutscher, DO

## 2019-11-01 ENCOUNTER — Encounter (INDEPENDENT_AMBULATORY_CARE_PROVIDER_SITE_OTHER): Payer: Self-pay | Admitting: Family Medicine

## 2019-11-01 NOTE — Telephone Encounter (Signed)
FYI

## 2019-11-06 ENCOUNTER — Other Ambulatory Visit: Payer: Self-pay | Admitting: Cardiology

## 2019-11-13 ENCOUNTER — Encounter (INDEPENDENT_AMBULATORY_CARE_PROVIDER_SITE_OTHER): Payer: Self-pay | Admitting: Family Medicine

## 2019-11-14 NOTE — Telephone Encounter (Signed)
FYI

## 2019-11-15 ENCOUNTER — Other Ambulatory Visit: Payer: Self-pay

## 2019-11-15 ENCOUNTER — Encounter (INDEPENDENT_AMBULATORY_CARE_PROVIDER_SITE_OTHER): Payer: Self-pay | Admitting: Family Medicine

## 2019-11-15 ENCOUNTER — Ambulatory Visit (INDEPENDENT_AMBULATORY_CARE_PROVIDER_SITE_OTHER): Payer: 59 | Admitting: Family Medicine

## 2019-11-15 VITALS — BP 116/76 | HR 68 | Temp 98.4°F | Ht 69.0 in | Wt 280.0 lb

## 2019-11-15 DIAGNOSIS — G4700 Insomnia, unspecified: Secondary | ICD-10-CM | POA: Diagnosis not present

## 2019-11-15 DIAGNOSIS — F3289 Other specified depressive episodes: Secondary | ICD-10-CM

## 2019-11-15 DIAGNOSIS — Z9884 Bariatric surgery status: Secondary | ICD-10-CM

## 2019-11-15 DIAGNOSIS — G4733 Obstructive sleep apnea (adult) (pediatric): Secondary | ICD-10-CM | POA: Diagnosis not present

## 2019-11-15 DIAGNOSIS — I4891 Unspecified atrial fibrillation: Secondary | ICD-10-CM

## 2019-11-15 DIAGNOSIS — Z6841 Body Mass Index (BMI) 40.0 and over, adult: Secondary | ICD-10-CM

## 2019-11-15 MED ORDER — TEMAZEPAM 7.5 MG PO CAPS: 7.5000 mg | ORAL_CAPSULE | Freq: Every evening | ORAL | 0 refills | Status: DC | PRN

## 2019-11-15 NOTE — Progress Notes (Signed)
Chief Complaint:   OBESITY Trishana is here to discuss her progress with her obesity treatment plan along with follow-up of her obesity related diagnoses. Korryn is on the Category 3 Plan and states she is following her eating plan approximately 60% of the time. Mahagany states she is walking for 30 minutes 3 times per week.  Today's visit was #: 3 Starting weight: 281 lbs Starting date: 10/17/2019 Today's weight: 280 lbs Today's date: 11/15/2019 Total lbs lost to date: 1 lb Total lbs lost since last in-office visit: 4 lbs  Interim History: Kirra reports that she is not sleeping well.  She says that trazodone helped on night one, but not after.  Kirke Shaggy helps with polyphagia, but concerned about increased heart rate.  She says she has been exercising more and drinking plenty of water.  Subjective:   1. Insomnia Chemaine is having difficulty sleeping despite trying trazodone.  2. History of gastric bypass Stephana had gastric bypass surgery in 2018.  3. Atrial fibrillation She endorses increased heart rate over the past few weeks.  4. OSA (obstructive sleep apnea) Antonesha has a diagnosis of sleep apnea. She reports that she is not using a CPAP regularly.   5. Other depression, with emotional eating Deshun is struggling with emotional eating and using food for comfort to the extent that it is negatively impacting her health. She has been working on behavior modification techniques to help reduce her emotional eating and has been somewhat successful.   Assessment/Plan:   1. Insomnia The problem of recurrent insomnia was discussed. Orders and follow up as documented in patient record. Counseling: Intensive lifestyle modifications are the first line treatment for this issue. We discussed several lifestyle modifications today and she will continue to work on diet, exercise and weight loss efforts.   Counseling  Limit or avoid alcohol, caffeinated beverages, and cigarettes, especially close to  bedtime.   Do not eat a large meal or eat spicy foods right before bedtime. This can lead to digestive discomfort that can make it hard for you to sleep.  Keep a sleep diary to help you and your health care provider figure out what could be causing your insomnia.  . Make your bedroom a dark, comfortable place where it is easy to fall asleep. ? Put up shades or blackout curtains to block light from outside. ? Use a white noise machine to block noise. ? Keep the temperature cool. . Limit screen use before bedtime. This includes: ? Watching TV. ? Using your smartphone, tablet, or computer. . Stick to a routine that includes going to bed and waking up at the same times every day and night. This can help you fall asleep faster. Consider making a quiet activity, such as reading, part of your nighttime routine. . Try to avoid taking naps during the day so that you sleep better at night. . Get out of bed if you are still awake after 15 minutes of trying to sleep. Keep the lights down, but try reading or doing a quiet activity. When you feel sleepy, go back to bed.  Orders - temazepam (RESTORIL) 7.5 MG capsule; Take 1 capsule (7.5 mg total) by mouth at bedtime as needed for sleep.  Dispense: 30 capsule; Refill: 0  2. History of gastric bypass Juiliana is at risk for malnutrition due to her previous bariatric surgery.   Counseling  You may need to eat 3 meals and 2 snacks, or 5 small meals each day in order to  reach your protein and calorie goals.   Allow at least 15 minutes for each meal so that you can eat mindfully. Listen to your body so that you do not overeat. For most people, your sleeve or pouch will comfortably hold 4-6 ounces.  Eat foods from all food groups. This includes fruits and vegetables, grains, dairy, and meat and other proteins.  Include a protein-rich food at every meal and snack, and eat the protein food first.   You should be taking a Bariatric Multivitamin as well as  calcium.   3. Atrial fibrillation, unspecified type ([place]) Will check labs today, as below.  Orders - Comprehensive metabolic panel - Magnesium  4. OSA (obstructive sleep apnea) Intensive lifestyle modifications are the first line treatment for this issue. We discussed several lifestyle modifications today and she will continue to work on diet, exercise and weight loss efforts. We will continue to monitor. Orders and follow up as documented in patient record.   Counseling  Sleep apnea is a condition in which breathing pauses or becomes shallow during sleep. This happens over and over during the night. This disrupts your sleep and keeps your body from getting the rest that it needs, which can cause tiredness and lack of energy (fatigue) during the day.  Sleep apnea treatment: If you were given a device to open your airway while you sleep, USE IT!  5. Other depression, with emotional eating Nataysia is already taking Saxenda 0.6 mg daily.  She will stay at that dose.  6. Class 3 severe obesity with serious comorbidity and body mass index (BMI) of 40.0 to 44.9 in adult, unspecified obesity type (HCC) Zorah is currently in the action stage of change. As such, her goal is to continue with weight loss efforts. She has agreed to the Category 3 Plan.   Exercise goals: For substantial health benefits, adults should do at least 150 minutes (2 hours and 30 minutes) a week of moderate-intensity, or 75 minutes (1 hour and 15 minutes) a week of vigorous-intensity aerobic physical activity, or an equivalent combination of moderate- and vigorous-intensity aerobic activity. Aerobic activity should be performed in episodes of at least 10 minutes, and preferably, it should be spread throughout the week.  Behavioral modification strategies: increasing water intake.  Amesha has agreed to follow-up with our clinic in 2 weeks. She was informed of the importance of frequent follow-up visits to maximize her success  with intensive lifestyle modifications for her multiple health conditions.   Tiela was informed we would discuss her lab results at her next visit unless there is a critical issue that needs to be addressed sooner. Eilyn agreed to keep her next visit at the agreed upon time to discuss these results.  Objective:   Blood pressure 116/76, pulse 68, temperature 98.4 F (36.9 C), temperature source Oral, height 5\' 9"  (1.753 m), weight 280 lb (127 kg), last menstrual period 08/10/2005, SpO2 96 %. Body mass index is 41.35 kg/m.  General: Cooperative, alert, well developed, in no acute distress. HEENT: Conjunctivae and lids unremarkable. Cardiovascular: Regular rhythm.  Lungs: Normal work of breathing. Neurologic: No focal deficits.   Lab Results  Component Value Date   CREATININE 0.85 10/17/2019   BUN 16 10/17/2019   NA 138 10/17/2019   K 4.8 10/17/2019   CL 101 10/17/2019   CO2 24 10/17/2019   Lab Results  Component Value Date   ALT 22 10/17/2019   AST 27 10/17/2019   ALKPHOS 130 (H) 10/17/2019   BILITOT  0.4 10/17/2019   Lab Results  Component Value Date   HGBA1C 5.5 10/17/2019   Lab Results  Component Value Date   INSULIN 6.9 10/17/2019   Lab Results  Component Value Date   TSH 1.580 10/17/2019   Lab Results  Component Value Date   WBC 5.8 10/17/2019   HGB 12.6 10/17/2019   HCT 38.7 10/17/2019   MCV 91 10/17/2019   PLT 370 10/17/2019   Lab Results  Component Value Date   IRON 78 10/17/2019   TIBC 473 (H) 10/17/2019   FERRITIN 24 10/17/2019   Attestation Statements:   Reviewed by clinician on day of visit: allergies, medications, problem list, medical history, surgical history, family history, social history, and previous encounter notes.  I, Water quality scientist, CMA, am acting as Location manager for [employer], DO.  I have reviewed the above documentation for accuracy and completeness, and I agree with the above. Briscoe Deutscher, DO

## 2019-11-16 LAB — COMPREHENSIVE METABOLIC PANEL
ALT: 17 IU/L (ref 0–32)
AST: 21 IU/L (ref 0–40)
Albumin/Globulin Ratio: 1.4 (ref 1.2–2.2)
Albumin: 3.9 g/dL (ref 3.8–4.9)
Alkaline Phosphatase: 133 IU/L — ABNORMAL HIGH (ref 39–117)
BUN/Creatinine Ratio: 13 (ref 9–23)
BUN: 11 mg/dL (ref 6–24)
Bilirubin Total: 0.3 mg/dL (ref 0.0–1.2)
CO2: 26 mmol/L (ref 20–29)
Calcium: 9.4 mg/dL (ref 8.7–10.2)
Chloride: 104 mmol/L (ref 96–106)
Creatinine, Ser: 0.87 mg/dL (ref 0.57–1.00)
GFR calc Af Amer: 88 mL/min/{1.73_m2} (ref >59)
GFR calc non Af Amer: 76 mL/min/{1.73_m2} (ref >59)
Globulin, Total: 2.7 g/dL (ref 1.5–4.5)
Glucose: 95 mg/dL (ref 65–99)
Potassium: 4.6 mmol/L (ref 3.5–5.2)
Sodium: 140 mmol/L (ref 134–144)
Total Protein: 6.6 g/dL (ref 6.0–8.5)

## 2019-11-16 LAB — MAGNESIUM: Magnesium: 2.1 mg/dL (ref 1.6–2.3)

## 2019-11-24 ENCOUNTER — Encounter (INDEPENDENT_AMBULATORY_CARE_PROVIDER_SITE_OTHER): Payer: Self-pay | Admitting: Family Medicine

## 2019-12-11 ENCOUNTER — Ambulatory Visit (INDEPENDENT_AMBULATORY_CARE_PROVIDER_SITE_OTHER): Payer: 59 | Admitting: Family Medicine

## 2019-12-16 ENCOUNTER — Encounter: Payer: Self-pay | Admitting: Obstetrics & Gynecology

## 2019-12-18 ENCOUNTER — Other Ambulatory Visit: Payer: Self-pay

## 2019-12-18 ENCOUNTER — Other Ambulatory Visit: Payer: Self-pay | Admitting: Obstetrics & Gynecology

## 2019-12-18 ENCOUNTER — Encounter: Payer: Self-pay | Admitting: Obstetrics & Gynecology

## 2019-12-18 ENCOUNTER — Ambulatory Visit: Payer: 59 | Admitting: Obstetrics & Gynecology

## 2019-12-18 ENCOUNTER — Telehealth: Payer: Self-pay | Admitting: Obstetrics & Gynecology

## 2019-12-18 VITALS — BP 120/80 | HR 70 | Temp 97.0°F | Resp 16 | Wt 284.0 lb

## 2019-12-18 DIAGNOSIS — N6082 Other benign mammary dysplasias of left breast: Secondary | ICD-10-CM

## 2019-12-18 DIAGNOSIS — N6320 Unspecified lump in the left breast, unspecified quadrant: Secondary | ICD-10-CM | POA: Diagnosis not present

## 2019-12-18 NOTE — Progress Notes (Signed)
GYNECOLOGY  VISIT  CC:   Breast lump  HPI: 54 y.o. G2P1 Divorced Black or [place] American female here for breast lump on left breast.  At first she thought it was possibly a pimple when it first started.  She feels this has been present for about two months.  Now the area feels hard.  There is some tenderness but she has been touching it a lot.  Denies nipple discharge.    She's now worried some and has been feeling the other breast and isn't completely sure if she feels a mass/lump on that side as well.  Thinks she's just over examining herself, really.  Last MMG 09/01/2016.  Pt felt sure she'd had one sooner but this is most recent MMG from breast center.  GYNECOLOGIC HISTORY: Patient's last menstrual period was 08/10/2005. Contraception: hysterectomy Menopausal hormone therapy: none  Patient Active Problem List   Diagnosis Date Noted  . Vasomotor symptoms due to menopause 10/31/2019  . History of Roux-en-Y gastric bypass, 2018 05/03/2017  . Sleep apnea 07/14/2016  . Primary osteoarthritis of right knee 06/26/2016  . Paroxysmal atrial fibrillation ([place]), followed by Dr. Einar Gip   . Nodule of right lung 05/05/2012  . Lap band APS, 02/18/2009 11/13/2011  . Environmental allergies 11/11/2011  . Anxiety 09/03/2011  . Essential hypertension 11/11/2009  . Emotional eating 10/11/2008  . Gastro-esophageal reflux disease with esophagitis 10/11/2008    Past Medical History:  Diagnosis Date  . Adenomyosis 7/07  . Anemia   . Anxiety   . Arthritis   . Atrial fibrillation ([place])   . Compartment syndrome of lower extremity ([place]) 08/13/2014  . Depression   . Dysrhythmia   . GERD (gastroesophageal reflux disease)   . H/O blood clots    superficial blood clots with OCPs  . Hypertension   . Insomnia   . Lower extremity edema   . Sinus congestion    saw ENT, had nasal surgery to clean out nasal area for bacteria, mold  . Sleep apnea   . Uterine fibroid   . Vitamin D deficiency     Past  Surgical History:  Procedure Laterality Date  . ABDOMINAL HYSTERECTOMY    . BREAST BIOPSY     x2  . CESAREAN SECTION    . ESOPHAGOGASTRODUODENOSCOPY N/A 06/15/2016   Procedure: ESOPHAGOGASTRODUODENOSCOPY (EGD);  Surgeon: Alphonsa Overall, MD;  Location: [facility];  Service: General;  Laterality: N/A;  . ESOPHAGOGASTRODUODENOSCOPY (EGD) WITH PROPOFOL N/A 09/20/2018   Procedure: ESOPHAGOGASTRODUODENOSCOPY (EGD) WITH PROPOFOL;  Surgeon: Carol Ada, MD;  Location: [facility];  Service: Endoscopy;  Laterality: N/A;  . LAPAROSCOPIC GASTRIC BANDING  2010  . LAPAROSCOPIC GASTRIC BANDING  2011  . LAPAROSCOPY     x2  . LAPAROSCOPY N/A 09/21/2018   Procedure: LAPAROSCOPY DIAGNOSTIC;  Surgeon: Greer Pickerel, MD;  Location: [place];  Service: General;  Laterality: N/A;  . MOUTH SURGERY    . NASAL SINUS SURGERY  4/13  . PARTIAL KNEE ARTHROPLASTY Right 06/26/2016   Procedure: RIGHT UNICOMPARTMENTAL KNEE CODYLE AND PLATEAU MEDIAL COMPARTMENT;  Surgeon: Renette Butters, MD;  Location: [place];  Service: Orthopedics;  Laterality: Right;  . ROUX-EN-Y GASTRIC BYPASS  2018    MEDS:   Current Outpatient Medications on File Prior to Visit  Medication Sig Dispense Refill  . ALPRAZolam (XANAX) 0.5 MG tablet SMARTSIG:1-2 Tablet(s) By Mouth 1 to 2 Times Daily PRN    . CALCIUM PO Take 1 tablet by mouth.     . diltiazem (  CARDIZEM CD) 180 MG 24 hr capsule     . diltiazem (TIAZAC) 360 MG 24 hr capsule Take 360 mg by mouth daily.     Marland Kitchen ELIQUIS 5 MG TABS tablet TAKE 1 TABLET TWICE A DAY 180 tablet 3  . escitalopram (LEXAPRO) 20 MG tablet Take 1.5 tablets (30 mg total) by mouth daily. 45 tablet 0  . famotidine (PEPCID) 40 MG tablet Take 40 mg by mouth daily.    . flecainide (TAMBOCOR) 150 MG tablet TAKE 1 TABLET TWICE A DAY 180 tablet 3  . hydrocortisone 2.5 % cream Apply BID to affected areas 1 week on 1 week off    . Insulin Pen Needle (BD PEN NEEDLE NANO 2ND GEN) 32G X 4 MM MISC 1 Package by  Does not apply route daily. 100 each 0  . ketoconazole (NIZORAL) 2 % cream Apply BID to affected areas for 3 weeks    . loratadine (CLARITIN) 10 MG tablet Take 10 mg by mouth daily.    . Melatonin 10 MG CAPS Take 1 capsule by mouth daily.    . metoprolol succinate (TOPROL-XL) 50 MG 24 hr tablet TAKE 1 TABLET DAILY 90 tablet 0  . Multiple Vitamins-Minerals (MULTIVITAMIN WITH MINERALS) tablet Take 1 tablet by mouth.     . nitrofurantoin, macrocrystal-monohydrate, (MACROBID) 100 MG capsule Take 100 mg by mouth 2 (two) times daily.    Marland Kitchen spironolactone (ALDACTONE) 25 MG tablet Take 25 mg by mouth daily.    . valsartan (DIOVAN) 160 MG tablet Take 160 mg by mouth daily.    . Liraglutide -Weight Management (SAXENDA) 18 MG/3ML SOPN Inject 0.5 mLs (3 mg total) into the skin daily. (Patient not taking: Reported on 12/18/2019) 5 pen 0   No current facility-administered medications on file prior to visit.    ALLERGIES: Flagyl [metronidazole] and Levaquin [levofloxacin]  Family History  Problem Relation Age of Onset  . Cancer Father        prostate and lung  . Diabetes Father   . Hypertension Father   . Alcoholism Father   . Diabetes Mother   . Hypertension Mother   . Hyperlipidemia Mother   . Heart disease Mother   . Depression Mother   . Ovarian cancer Paternal Grandmother   . Hypertension Brother        x2  . Hypothyroidism Brother   . Hypothyroidism Maternal Grandmother     SH:  Divorced, non smoker  Review of Systems  Constitutional: Negative.   HENT: Negative.   Eyes: Negative.   Respiratory: Negative.   Cardiovascular: Negative.   Gastrointestinal: Negative.   Endocrine: Negative.   Genitourinary: Negative.   Musculoskeletal: Negative.   Skin: Negative.        Breast lump on both breast  Allergic/Immunologic: Negative.   Neurological: Negative.   Psychiatric/Behavioral: Negative.     PHYSICAL EXAMINATION:    BP 120/80   Pulse 70   Temp (!) 97 F (36.1 C) (Skin)    Resp 16   Wt 284 lb (128.8 kg)   LMP 08/10/2005   BMI 41.94 kg/m     Physical Exam  Constitutional: She is oriented to person, place, and time. She appears well-developed and well-nourished.  Neck: No thyromegaly present.  Respiratory: Right breast exhibits no inverted nipple, no mass, no nipple discharge, no skin change and no tenderness. Left breast exhibits mass and skin change. Left breast exhibits no inverted nipple, no nipple discharge and no tenderness. Breasts are symmetrical.  Musculoskeletal:     Cervical back: Normal range of motion and neck supple.  Lymphadenopathy:    She has no cervical adenopathy.  Neurological: She is alert and oriented to person, place, and time.  Skin: Skin is warm and dry.  Psychiatric: She has a normal mood and affect.   Assessment: Breast/skin mass, present about 2 months  Plan: Findings most consistent with sebaceous cyst and pt is reassured today.  Still feel should proceed with diagnostic imaging, bilaterally, at pt is over 3 years from prior MMG.  Diagnostic bilateral imaging and left breast ultrasound will be scheduled for pt and orders placed by triage RN.

## 2019-12-18 NOTE — Telephone Encounter (Signed)
AEX at PCP with Dr Juleen China  [facility] 08/2016- Birads 1, neg H/o breast biopsy x 2  No HRT  Spoke with pt. Pt reports having a found breast "lump" under left breast. Pt describes it started out as "pimple" and now has discomfort and is hard.  Pt states has been there x 2 weeks. Pt denies nipple discharge, breast skin changes or size, fever, redness.   Advised pt to be seen for further evaluation. Pt agreeable. Pt scheduled today with Dr Sabra Heck 5/10 at [place]. Pt agreeable to date and time. Pt offered earlier appt, declined. CPS neg.  Routing to Dr Sabra Heck for review. Encounter closed.

## 2019-12-18 NOTE — Telephone Encounter (Signed)
[facility]  Phone Number: [phone]  Dr.Miller,   I have a hard lump under my left breast. It originally looked like a pimple but it has turned hard.

## 2019-12-22 ENCOUNTER — Ambulatory Visit: Payer: 59 | Admitting: Obstetrics & Gynecology

## 2019-12-22 ENCOUNTER — Other Ambulatory Visit: Payer: Self-pay

## 2019-12-22 ENCOUNTER — Encounter: Payer: Self-pay | Admitting: Obstetrics & Gynecology

## 2019-12-22 ENCOUNTER — Telehealth: Payer: Self-pay | Admitting: *Deleted

## 2019-12-22 VITALS — BP 116/80 | HR 70 | Temp 97.0°F | Resp 16 | Wt 285.0 lb

## 2019-12-22 DIAGNOSIS — R319 Hematuria, unspecified: Secondary | ICD-10-CM

## 2019-12-22 DIAGNOSIS — N898 Other specified noninflammatory disorders of vagina: Secondary | ICD-10-CM

## 2019-12-22 DIAGNOSIS — N390 Urinary tract infection, site not specified: Secondary | ICD-10-CM | POA: Diagnosis not present

## 2019-12-22 LAB — POCT URINALYSIS DIPSTICK
Bilirubin, UA: NEGATIVE
Glucose, UA: NEGATIVE
Ketones, UA: NEGATIVE
Nitrite, UA: NEGATIVE
Protein, UA: NEGATIVE
Urobilinogen, UA: NEGATIVE E.U./dL — AB
pH, UA: 5 (ref 5.0–8.0)

## 2019-12-22 MED ORDER — FLUCONAZOLE 150 MG PO TABS: 150.0000 mg | ORAL_TABLET | Freq: Once | ORAL | 0 refills | Status: AC

## 2019-12-22 MED ORDER — SULFAMETHOXAZOLE-TRIMETHOPRIM 800-160 MG PO TABS: 1.0000 | ORAL_TABLET | Freq: Two times a day (BID) | ORAL | 0 refills | Status: DC

## 2019-12-22 NOTE — Progress Notes (Signed)
GYNECOLOGY  VISIT  CC:   Possible urinary urgency  HPI: 54 y.o. G2P1 Divorced Black or [place] American female here for complaint of having possible uti and vaginal odor.  Pt reports calling [facility] on 5/9 due to increased urinary frequency, dysuria and being treated with macrobid 100mg  BID x 5 days.  She is also having vaginal odor.  Symptoms have not resolved.  Denies vaginal bleeding.  Called today for prescription.  Recommended she be seen.  She is SA.  She has no pelvic pain, low back pain, or fever.  GYNECOLOGIC HISTORY: Patient's last menstrual period was 08/10/2005. Contraception: hysterectomy Menopausal hormone therapy: n/a poct urine-rbc 1+, wbc 1+  Patient Active Problem List   Diagnosis Date Noted  . Vasomotor symptoms due to menopause 10/31/2019  . History of Roux-en-Y gastric bypass, 2018 05/03/2017  . Sleep apnea 07/14/2016  . Primary osteoarthritis of right knee 06/26/2016  . Paroxysmal atrial fibrillation ([place]), followed by Dr. Einar Gip   . Nodule of right lung 05/05/2012  . Lap band APS, 02/18/2009 11/13/2011  . Environmental allergies 11/11/2011  . Anxiety 09/03/2011  . Essential hypertension 11/11/2009  . Emotional eating 10/11/2008  . Gastro-esophageal reflux disease with esophagitis 10/11/2008    Past Medical History:  Diagnosis Date  . Adenomyosis 7/07  . Anemia   . Anxiety   . Arthritis   . Atrial fibrillation ([place])   . Compartment syndrome of lower extremity ([place]) 08/13/2014  . Depression   . Dysrhythmia   . GERD (gastroesophageal reflux disease)   . H/O blood clots    superficial blood clots with OCPs  . Hypertension   . Insomnia   . Lower extremity edema   . Sinus congestion    saw ENT, had nasal surgery to clean out nasal area for bacteria, mold  . Sleep apnea   . Uterine fibroid   . Vitamin D deficiency     Past Surgical History:  Procedure Laterality Date  . ABDOMINAL HYSTERECTOMY    . BREAST BIOPSY     x2  . CESAREAN SECTION    .  ESOPHAGOGASTRODUODENOSCOPY N/A 06/15/2016   Procedure: ESOPHAGOGASTRODUODENOSCOPY (EGD);  Surgeon: Alphonsa Overall, MD;  Location: [facility];  Service: General;  Laterality: N/A;  . ESOPHAGOGASTRODUODENOSCOPY (EGD) WITH PROPOFOL N/A 09/20/2018   Procedure: ESOPHAGOGASTRODUODENOSCOPY (EGD) WITH PROPOFOL;  Surgeon: Carol Ada, MD;  Location: [place];  Service: Endoscopy;  Laterality: N/A;  . LAPAROSCOPIC GASTRIC BANDING  2010  . LAPAROSCOPIC GASTRIC BANDING  2011  . LAPAROSCOPY     x2  . LAPAROSCOPY N/A 09/21/2018   Procedure: LAPAROSCOPY DIAGNOSTIC;  Surgeon: Greer Pickerel, MD;  Location: [place];  Service: General;  Laterality: N/A;  . MOUTH SURGERY    . NASAL SINUS SURGERY  4/13  . PARTIAL KNEE ARTHROPLASTY Right 06/26/2016   Procedure: RIGHT UNICOMPARTMENTAL KNEE CODYLE AND PLATEAU MEDIAL COMPARTMENT;  Surgeon: Renette Butters, MD;  Location: [place];  Service: Orthopedics;  Laterality: Right;  . ROUX-EN-Y GASTRIC BYPASS  2018    MEDS:   Current Outpatient Medications on File Prior to Visit  Medication Sig Dispense Refill  . ALPRAZolam (XANAX) 0.5 MG tablet SMARTSIG:1-2 Tablet(s) By Mouth 1 to 2 Times Daily PRN    . CALCIUM PO Take 1 tablet by mouth.     . diltiazem (CARDIZEM CD) 180 MG 24 hr capsule     . ELIQUIS 5 MG TABS tablet TAKE 1 TABLET TWICE A DAY 180 tablet 3  . escitalopram (LEXAPRO) 20 MG  tablet Take 1.5 tablets (30 mg total) by mouth daily. 45 tablet 0  . famotidine (PEPCID) 40 MG tablet Take 40 mg by mouth daily.    . flecainide (TAMBOCOR) 150 MG tablet TAKE 1 TABLET TWICE A DAY 180 tablet 3  . hydrocortisone 2.5 % cream Apply BID to affected areas 1 week on 1 week off    . Insulin Pen Needle (BD PEN NEEDLE NANO 2ND GEN) 32G X 4 MM MISC 1 Package by Does not apply route daily. 100 each 0  . ketoconazole (NIZORAL) 2 % cream Apply BID to affected areas for 3 weeks    . loratadine (CLARITIN) 10 MG tablet Take 10 mg by mouth daily.    . Melatonin 10 MG  CAPS Take 1 capsule by mouth daily.    . metoprolol succinate (TOPROL-XL) 50 MG 24 hr tablet TAKE 1 TABLET DAILY 90 tablet 0  . Multiple Vitamins-Minerals (MULTIVITAMIN WITH MINERALS) tablet Take 1 tablet by mouth.     . spironolactone (ALDACTONE) 25 MG tablet Take 25 mg by mouth daily.    . valsartan (DIOVAN) 160 MG tablet Take 160 mg by mouth daily.     No current facility-administered medications on file prior to visit.    ALLERGIES: Flagyl [metronidazole] and Levaquin [levofloxacin]  Family History  Problem Relation Age of Onset  . Cancer Father        prostate and lung  . Diabetes Father   . Hypertension Father   . Alcoholism Father   . Diabetes Mother   . Hypertension Mother   . Hyperlipidemia Mother   . Heart disease Mother   . Depression Mother   . Ovarian cancer Paternal Grandmother   . Hypertension Brother        x2  . Hypothyroidism Brother   . Hypothyroidism Maternal Grandmother     SH:  Single, non smoker  Review of Systems  Genitourinary: Positive for frequency.       Urinary frequency, burning with urination, odor, vaginal itching    PHYSICAL EXAMINATION:    BP 116/80   Pulse 70   Temp (!) 97 F (36.1 C) (Skin)   Resp 16   Wt 285 lb (129.3 kg)   LMP 08/10/2005   BMI 42.09 kg/m     General appearance: alert, cooperative and appears stated age Abdomen: soft, no suprapubic pain; no masses,  no organomegaly Lymph:  no inguinal LAD noted  Pelvic: External genitalia:  no lesions              Urethra:  normal appearing urethra with no masses, tenderness or lesions              Bartholins and Skenes: normal                 Vagina: normal appearing vagina with white clumpy appearing discharge, no lesions              Cervix: absent              Bimanual Exam:  Uterus:  uterus absent              Adnexa: no mass, fullness, tenderness  Chaperone, Royal Hawthorn, CMA, was present for exam.  Assessment: Urinary frequency and dysuria Vaginal odor with  discharge Possible yeast vaginitis  Plan: Bactrim DS BID x 3 days to pharmacy Urine micro and culture pending Vaginitis swab including GC/Chl/trich obtained today Diflucan 150mg  po x 1, repeat 72 hours.

## 2019-12-22 NOTE — Telephone Encounter (Signed)
[name]  Phone Number: [phone]  Dr . Sabra Heck,   When I came to see you I was on Nitrofuratoin mono-Mac 100 mg for 5 days for pain when urinating and a tele med diagnosed UTI. I have finished the meds and it has not gone away! I have had sex with the same man and wondering if the infection has cleared up. Can you call me in another prescription.the telemedicine doc wanted to prescribe cupronickel and decided not to because of the flecanide. Should I just go to urgent care this weekend? Just another thing on my list!   [facility]

## 2019-12-22 NOTE — Telephone Encounter (Signed)
Spoke with patient. Patient was sen by [facility] on 5/9 for UTI symptoms, tx with Macrobid 100 mg x5 days, took last does on 5/13. Patient reports symptoms have not resolved. Patient reports dysuria, urinary frequency and vaginal odor. Denies flank pain, fever/chills, N/V, vaginal d/c or bleeding. Patient is requesting additional Rx. Advised patient OV needed for further evaluation, will review schedule and recommendations with Dr. Sabra Heck and return call, patient agreeable.   Dr. Sabra Heck -please advise.

## 2019-12-22 NOTE — Telephone Encounter (Signed)
Spoke with patient, OV scheduled for 5/14 at 4:15pm with Dr. Sabra Heck.  [id number] prescreen negative, precautions reviewed.   Encounter closed.

## 2019-12-23 LAB — URINALYSIS, MICROSCOPIC ONLY: Casts: NONE SEEN /lpf

## 2019-12-24 LAB — URINE CULTURE

## 2019-12-25 ENCOUNTER — Other Ambulatory Visit: Payer: Self-pay

## 2019-12-25 ENCOUNTER — Ambulatory Visit
Admission: RE | Admit: 2019-12-25 | Discharge: 2019-12-25 | Disposition: A | Discharge status: Home / Self Care | Payer: 59 | Source: Ambulatory Visit | Attending: Obstetrics & Gynecology | Admitting: Obstetrics & Gynecology

## 2019-12-25 ENCOUNTER — Other Ambulatory Visit: Payer: Self-pay | Admitting: Obstetrics & Gynecology

## 2019-12-25 ENCOUNTER — Ambulatory Visit
Admission: RE | Admit: 2019-12-25 | Discharge: 2019-12-25 | Disposition: A | Discharge status: Home / Self Care | Payer: BC Managed Care – PPO | Source: Ambulatory Visit | Attending: Obstetrics & Gynecology | Admitting: Obstetrics & Gynecology

## 2019-12-25 DIAGNOSIS — N6082 Other benign mammary dysplasias of left breast: Secondary | ICD-10-CM

## 2019-12-25 DIAGNOSIS — N6320 Unspecified lump in the left breast, unspecified quadrant: Secondary | ICD-10-CM

## 2019-12-25 MED ORDER — TINIDAZOLE 500 MG PO TABS
500.0000 mg | ORAL_TABLET | Freq: Two times a day (BID) | ORAL | 0 refills | Status: AC
Start: 2019-12-25 — End: 2019-12-30

## 2019-12-26 LAB — NUSWAB VAGINITIS PLUS (VG+)
Atopobium vaginae: HIGH Score — AB
BVAB 2: HIGH Score — AB
Candida albicans, NAA: NEGATIVE
Candida glabrata, NAA: NEGATIVE
Chlamydia trachomatis, NAA: NEGATIVE
Megasphaera 1: HIGH Score — AB
Neisseria gonorrhoeae, NAA: NEGATIVE
Trich vag by NAA: NEGATIVE

## 2020-01-11 ENCOUNTER — Ambulatory Visit (INDEPENDENT_AMBULATORY_CARE_PROVIDER_SITE_OTHER): Payer: 59 | Admitting: Family Medicine

## 2020-01-11 ENCOUNTER — Encounter (INDEPENDENT_AMBULATORY_CARE_PROVIDER_SITE_OTHER): Payer: Self-pay | Admitting: Family Medicine

## 2020-01-11 ENCOUNTER — Other Ambulatory Visit: Payer: Self-pay

## 2020-01-11 VITALS — BP 116/77 | HR 73 | Temp 98.0°F | Ht 69.0 in | Wt 283.0 lb

## 2020-01-11 DIAGNOSIS — Z6841 Body Mass Index (BMI) 40.0 and over, adult: Secondary | ICD-10-CM | POA: Diagnosis not present

## 2020-01-11 DIAGNOSIS — E66813 Obesity, class 3: Secondary | ICD-10-CM

## 2020-01-11 DIAGNOSIS — Z9189 Other specified personal risk factors, not elsewhere classified: Secondary | ICD-10-CM | POA: Diagnosis not present

## 2020-01-11 DIAGNOSIS — F3289 Other specified depressive episodes: Secondary | ICD-10-CM

## 2020-01-11 MED ORDER — TOPIRAMATE 25 MG PO TABS: 25.0000 mg | ORAL_TABLET | Freq: Two times a day (BID) | ORAL | 0 refills | Status: DC

## 2020-01-11 NOTE — Progress Notes (Signed)
Chief Complaint:   OBESITY [name] is here to discuss her progress with her obesity treatment plan along with follow-up of her obesity related diagnoses. [name] is on the Category 3 Plan and states she is following her eating plan approximately 10-20% of the time. [name] states she is working with a Clinical research associate 3 days per week, doing cardio for 45 minutes everyday, and walking 1-2 miles 5 times per week.  Today's visit was #: 4 Starting weight: 281 lbs Starting date: 10/17/2019 Today's weight: 283 lbs Today's date: 01/11/2020 Total lbs lost to date: 0 Total lbs lost since last in-office visit: 0  Interim History: [name] has been having Unjury shakes in the morning, a salad at lunch and will "figure out dinner".  She is up 2 pounds of water today.  She reports that she has been seeing [name] at [facility].   Subjective:   1. Other depression, with emotional eating [name] is struggling with emotional eating and using food for comfort to the extent that it is negatively impacting her health. She has been working on behavior modification techniques to help reduce her emotional eating and has been minimally successful. She shows no sign of suicidal or homicidal ideations.  2. At risk for constipation [name] is at increased risk for constipation due to inadequate water intake, changes in diet, and/or use of medications such as GLP1 agonists. [name] denies hard, infrequent stools currently.   Assessment/Plan:   1. Other depression, with emotional eating Behavior modification techniques were discussed today to help [name] deal with her emotional/non-hunger eating behaviors.  Orders and follow up as documented in patient record.  Will start [name] on Topamax 25 mg at bedtime.  She will continue to work with [name] at [facility].  2. At risk for constipation [name] was given approximately 15 minutes of counseling today regarding prevention of constipation. She was encouraged to increase  water and fiber intake.   3. Class 3 severe obesity with serious comorbidity and body mass index (BMI) of 40.0 to 44.9 in adult, unspecified obesity type (HCC) [name] is currently in the action stage of change. As such, her goal is to continue with weight loss efforts. She has agreed to practicing portion control and making smarter food choices, such as increasing vegetables and decreasing simple carbohydrates  She needs to get 4 ounces of protein at lunch and 8 ounces of protein at dinner.   Exercise goals: As is.  Personal trainer.  Behavioral modification strategies: increasing lean protein intake, decreasing simple carbohydrates and increasing vegetables.  [name] has agreed to follow-up with our clinic in 2 weeks. She was informed of the importance of frequent follow-up visits to maximize her success with intensive lifestyle modifications for her multiple health conditions.   Objective:   Blood pressure 116/77, pulse 73, temperature 98 F (36.7 C), height 5\' 9"  (1.753 m), weight 283 lb (128.4 kg), last menstrual period 08/10/2005, SpO2 97 %. Body mass index is 41.79 kg/m.  General: Cooperative, alert, well developed, in no acute distress. HEENT: Conjunctivae and lids unremarkable. Cardiovascular: Regular rhythm.  Lungs: Normal work of breathing. Neurologic: No focal deficits.   Lab Results  Component Value Date   CREATININE 0.87 11/15/2019   BUN 11 11/15/2019   NA 140 11/15/2019   K 4.6 11/15/2019   CL 104 11/15/2019   CO2 26 11/15/2019   Lab Results  Component Value Date   ALT 17 11/15/2019   AST 21 11/15/2019   ALKPHOS 133 (  H) 11/15/2019   BILITOT 0.3 11/15/2019   Lab Results  Component Value Date   HGBA1C 5.5 10/17/2019   Lab Results  Component Value Date   INSULIN 6.9 10/17/2019   Lab Results  Component Value Date   TSH 1.580 10/17/2019   Lab Results  Component Value Date   WBC 5.8 10/17/2019   HGB 12.6 10/17/2019   HCT 38.7 10/17/2019   MCV 91  10/17/2019   PLT 370 10/17/2019   Lab Results  Component Value Date   IRON 78 10/17/2019   TIBC 473 (H) 10/17/2019   FERRITIN 24 10/17/2019   Attestation Statements:   Reviewed by clinician on day of visit: allergies, medications, problem list, medical history, surgical history, family history, social history, and previous encounter notes.  I, Water quality scientist, CMA, am acting as transcriptionist for Briscoe Deutscher, DO  I have reviewed the above documentation for accuracy and completeness, and I agree with the above. Briscoe Deutscher, DO

## 2020-01-28 ENCOUNTER — Other Ambulatory Visit: Payer: Self-pay | Admitting: Cardiology

## 2020-02-05 ENCOUNTER — Ambulatory Visit (INDEPENDENT_AMBULATORY_CARE_PROVIDER_SITE_OTHER): Payer: No Typology Code available for payment source | Admitting: Physician Assistant

## 2020-02-05 ENCOUNTER — Other Ambulatory Visit: Payer: Self-pay

## 2020-02-05 ENCOUNTER — Encounter (INDEPENDENT_AMBULATORY_CARE_PROVIDER_SITE_OTHER): Payer: Self-pay | Admitting: Physician Assistant

## 2020-02-05 VITALS — BP 113/78 | HR 64 | Temp 98.1°F | Ht 69.0 in | Wt 283.0 lb

## 2020-02-05 DIAGNOSIS — I10 Essential (primary) hypertension: Secondary | ICD-10-CM | POA: Diagnosis not present

## 2020-02-05 DIAGNOSIS — Z6841 Body Mass Index (BMI) 40.0 and over, adult: Secondary | ICD-10-CM

## 2020-02-05 DIAGNOSIS — Z9189 Other specified personal risk factors, not elsewhere classified: Secondary | ICD-10-CM

## 2020-02-05 DIAGNOSIS — F3289 Other specified depressive episodes: Secondary | ICD-10-CM

## 2020-02-05 MED ORDER — TOPIRAMATE 25 MG PO TABS: 25.0000 mg | ORAL_TABLET | Freq: Two times a day (BID) | ORAL | 0 refills | Status: DC

## 2020-02-05 NOTE — Progress Notes (Signed)
Chief Complaint:   OBESITY [name] is here to discuss her progress with her obesity treatment plan along with follow-up of her obesity related diagnoses. Ople is practicing portion control and making smarter food choices, such as increasing vegetables and decreasing simple carbohydrates and states she is following her eating plan approximately 10% of the time. [name] states she is working with a Physiological scientist 45 minutes 3 times per week.  Today's visit was #: 5 Starting weight: 281 lbs Starting date: 10/17/2019 Today's weight: 283 lbs Today's date: 02/05/2020 Total lbs lost to date: 0 Total lbs lost since last in-office visit: 0  Interim History: [name] reports that she is under a great deal of stress taking care of her mother and with other family situations. She is not following a plan currently, doesn't feel like she has time to journal, and is eating out often.  Subjective:   Other depression, with emotional eating. [name] is struggling with emotional eating and using food for comfort to the extent that it is negatively impacting her health. She has been working on behavior modification techniques to help reduce her emotional eating and has been somewhat successful. She shows no sign of suicidal or homicidal ideations. [name] is on Topamax and Lexapro.  Essential hypertension. [name] is on Diltiazem, metoprolol, and spironolactone. No chest pain. Blood pressure is controlled. She is exercising regularly.  BP Readings from Last 3 Encounters:  02/05/20 113/78  01/11/20 116/77  12/22/19 116/80   Lab Results  Component Value Date   CREATININE 0.87 11/15/2019   CREATININE 0.85 10/17/2019   CREATININE 0.81 09/20/2018   At risk for heart disease. [name] is at a higher than average risk for cardiovascular disease due to obesity.   Assessment/Plan:   Other depression, with emotional eating. Behavior modification techniques were discussed today to help [name] deal with her  emotional/non-hunger eating behaviors.  Orders and follow up as documented in patient record. Refill was given for topiramate (TOPAMAX) 25 MG tablet #60 with 0 refills.  Essential hypertension. Mansi is working on healthy weight loss and exercise to improve blood pressure control. We will watch for signs of hypotension as she continues her lifestyle modifications. She will continue her medications as directed.  At risk for heart disease. [name] was given approximately 15 minutes of coronary artery disease prevention counseling today. She is 54 y.o. female and has risk factors for heart disease including obesity. We discussed intensive lifestyle modifications today with an emphasis on specific weight loss instructions and strategies.   Repetitive spaced learning was employed today to elicit superior memory formation and behavioral change.  Class 3 severe obesity with serious comorbidity and body mass index (BMI) of 40.0 to 44.9 in adult, unspecified obesity type ([place]).  [name] is currently in the action stage of change. As such, her goal is to continue with weight loss efforts. She has agreed to the Category 3 Plan.   Exercise goals: For substantial health benefits, adults should do at least 150 minutes (2 hours and 30 minutes) a week of moderate-intensity, or 75 minutes (1 hour and 15 minutes) a week of vigorous-intensity aerobic physical activity, or an equivalent combination of moderate- and vigorous-intensity aerobic activity. Aerobic activity should be performed in episodes of at least 10 minutes, and preferably, it should be spread throughout the week.  Behavioral modification strategies: meal planning and cooking strategies and keeping healthy foods in the home.  [name] has agreed to follow-up with our clinic in 3 weeks.  She was informed of the importance of frequent follow-up visits to maximize her success with intensive lifestyle modifications for her multiple health conditions.   Objective:     Blood pressure 113/78, pulse 64, temperature 98.1 F (36.7 C), temperature source Oral, height 5\' 9"  (1.753 m), weight 283 lb (128.4 kg), last menstrual period 08/10/2005, SpO2 98 %. Body mass index is 41.79 kg/m.  General: Cooperative, alert, well developed, in no acute distress. HEENT: Conjunctivae and lids unremarkable. Cardiovascular: Regular rhythm.  Lungs: Normal work of breathing. Neurologic: No focal deficits.   Lab Results  Component Value Date   CREATININE 0.87 11/15/2019   BUN 11 11/15/2019   NA 140 11/15/2019   K 4.6 11/15/2019   CL 104 11/15/2019   CO2 26 11/15/2019   Lab Results  Component Value Date   ALT 17 11/15/2019   AST 21 11/15/2019   ALKPHOS 133 (H) 11/15/2019   BILITOT 0.3 11/15/2019   Lab Results  Component Value Date   HGBA1C 5.5 10/17/2019   Lab Results  Component Value Date   INSULIN 6.9 10/17/2019   Lab Results  Component Value Date   TSH 1.580 10/17/2019   No results found for: CHOL, HDL, LDLCALC, LDLDIRECT, TRIG, CHOLHDL Lab Results  Component Value Date   WBC 5.8 10/17/2019   HGB 12.6 10/17/2019   HCT 38.7 10/17/2019   MCV 91 10/17/2019   PLT 370 10/17/2019   Lab Results  Component Value Date   IRON 78 10/17/2019   TIBC 473 (H) 10/17/2019   FERRITIN 24 10/17/2019   Attestation Statements:   Reviewed by clinician on day of visit: allergies, medications, problem list, medical history, surgical history, family history, social history, and previous encounter notes.  IMichaelene Song, am acting as transcriptionist for Abby Potash, PA-C   I have reviewed the above documentation for accuracy and completeness, and I agree with the above. Abby Potash, PA-C

## 2020-02-26 ENCOUNTER — Ambulatory Visit (INDEPENDENT_AMBULATORY_CARE_PROVIDER_SITE_OTHER): Payer: No Typology Code available for payment source | Admitting: Physician Assistant

## 2020-02-26 ENCOUNTER — Encounter (INDEPENDENT_AMBULATORY_CARE_PROVIDER_SITE_OTHER): Payer: Self-pay

## 2020-02-27 ENCOUNTER — Other Ambulatory Visit: Payer: Self-pay

## 2020-02-27 NOTE — Telephone Encounter (Signed)
Medication refill request: Escitalopram 20 mg  Last video visit:  09/01/19 Next AEX:  None at this time  Last MMG (if hormonal medication request): NA Refill authorized: #[id number] with 0 Rf please advise.

## 2020-03-01 MED ORDER — ESCITALOPRAM OXALATE 20 MG PO TABS: 30.0000 mg | ORAL_TABLET | Freq: Every day | ORAL | 12 refills | Status: DC

## 2020-03-27 ENCOUNTER — Other Ambulatory Visit: Payer: Self-pay

## 2020-03-27 ENCOUNTER — Ambulatory Visit (INDEPENDENT_AMBULATORY_CARE_PROVIDER_SITE_OTHER): Payer: No Typology Code available for payment source | Admitting: Family Medicine

## 2020-03-27 ENCOUNTER — Encounter (INDEPENDENT_AMBULATORY_CARE_PROVIDER_SITE_OTHER): Payer: Self-pay | Admitting: Family Medicine

## 2020-03-27 ENCOUNTER — Ambulatory Visit
Admission: RE | Admit: 2020-03-27 | Discharge: 2020-03-27 | Disposition: A | Discharge status: Home / Self Care | Payer: No Typology Code available for payment source | Source: Ambulatory Visit | Attending: Obstetrics & Gynecology | Admitting: Obstetrics & Gynecology

## 2020-03-27 VITALS — BP 133/88 | HR 61 | Temp 98.1°F | Ht 69.0 in | Wt 290.0 lb

## 2020-03-27 DIAGNOSIS — N6082 Other benign mammary dysplasias of left breast: Secondary | ICD-10-CM

## 2020-03-27 DIAGNOSIS — Z9189 Other specified personal risk factors, not elsewhere classified: Secondary | ICD-10-CM | POA: Diagnosis not present

## 2020-03-27 DIAGNOSIS — E66813 Obesity, class 3: Secondary | ICD-10-CM

## 2020-03-27 DIAGNOSIS — Z6841 Body Mass Index (BMI) 40.0 and over, adult: Secondary | ICD-10-CM

## 2020-03-27 DIAGNOSIS — I10 Essential (primary) hypertension: Secondary | ICD-10-CM | POA: Diagnosis not present

## 2020-03-27 DIAGNOSIS — F419 Anxiety disorder, unspecified: Secondary | ICD-10-CM

## 2020-03-28 ENCOUNTER — Encounter (INDEPENDENT_AMBULATORY_CARE_PROVIDER_SITE_OTHER): Payer: Self-pay | Admitting: Family Medicine

## 2020-03-28 NOTE — Progress Notes (Signed)
Chief Complaint:   OBESITY Nasia is here to discuss her progress with her obesity treatment plan along with follow-up of her obesity related diagnoses. Jaculin is on the Category 3 Plan and states she is following her eating plan approximately 30% of the time. Montez states she is doing crossfit for 45 minutes 4 times per week.  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  She has a job interview in a few weeks.  She has increased her intake of ETOH, but is ready to get back on track.  She says she is making the basement hers.  Her mom is on the first floor.  She has one daughter who is doing well at [facility], and one who is struggling with anxiety, but is now in school.  She will have labs drawn at her next visit.  Subjective:   1. Essential hypertension Review: taking medications as instructed, no medication side effects noted, no chest pain on exertion, no dyspnea on exertion, no swelling of ankles.   BP Readings from Last 3 Encounters:  03/27/20 133/88  02/05/20 113/78  01/11/20 116/77   2. Anxiety, with emotional eating Mikaylah has anxiety with emotional eating.  She is taking Lexapro and Topamax.  3. At risk for impaired function of liver Gertude is at risk for impaired function of liver due to risk of fatty liver and increased alcohol.    Assessment/Plan:   1. Essential hypertension Janetta is working on healthy weight loss and exercise to improve blood pressure control. We will watch for signs of hypotension as she continues her lifestyle modifications.  2. Anxiety, with emotional eating Behavior modification techniques were discussed today to help Aalina deal with her anxiety.  Orders and follow up as documented in patient record.   - topiramate  (TOPAMAX) 25 MG tablet; Take 1 tablet (25 mg total) by mouth 2 (two) times daily.  Dispense: 60 tablet; Refill: 0  3. At risk for impaired function of liver Dashawn was given approximately 15 minutes of counseling today regarding prevention of impaired liver function. Nyah was educated about her risk of developing NASH or even liver failure and advised that the only proven treatment for NAFLD was weight loss of at least 5-10% of body weight.   4. Class 3 severe obesity with serious comorbidity and body mass index (BMI) of 40.0 to 44.9 in adult, unspecified obesity type (HCC) Makell is currently in the action stage of change. As such, her goal is to continue with weight loss efforts. She has agreed to the Category 3 Plan.   Exercise goals: For substantial health benefits, adults should do at least 150 minutes (2 hours and 30 minutes) a week of moderate-intensity, or 75 minutes (1 hour and 15 minutes) a week of vigorous-intensity aerobic physical activity, or an equivalent combination of moderate- and vigorous-intensity aerobic activity. Aerobic activity should be performed in episodes of at least 10 minutes, and preferably, it should be spread throughout the week.  Behavioral modification strategies: increasing lean protein intake, decreasing simple carbohydrates and decreasing alcohol intake.  Linlee has agreed to follow-up with our clinic in 2-3 weeks. She was informed of the importance of frequent follow-up visits to maximize her success with intensive lifestyle modifications for her multiple health conditions.  Objective:   Blood pressure 133/88, pulse 61, temperature 98.1 F (36.7 C), temperature source Oral, height 5\' 9"  (1.753 m), weight 290 lb (131.5 kg), last menstrual period 08/10/2005, SpO2 98 %. Body mass index is 42.83 kg/m.  General: Cooperative, alert, well developed, in no acute distress. HEENT: Conjunctivae and lids unremarkable. Cardiovascular: Regular rhythm.  Lungs: Normal  work of breathing. Neurologic: No focal deficits.   Lab Results  Component Value Date   CREATININE 0.87 11/15/2019   BUN 11 11/15/2019   NA 140 11/15/2019   K 4.6 11/15/2019   CL 104 11/15/2019   CO2 26 11/15/2019   Lab Results  Component Value Date   ALT 17 11/15/2019   AST 21 11/15/2019   ALKPHOS 133 (H) 11/15/2019   BILITOT 0.3 11/15/2019   Lab Results  Component Value Date   HGBA1C 5.5 10/17/2019   Lab Results  Component Value Date   INSULIN 6.9 10/17/2019   Lab Results  Component Value Date   TSH 1.580 10/17/2019   Lab Results  Component Value Date   WBC 5.8 10/17/2019   HGB 12.6 10/17/2019   HCT 38.7 10/17/2019   MCV 91 10/17/2019   PLT 370 10/17/2019   Lab Results  Component Value Date   IRON 78 10/17/2019   TIBC 473 (H) 10/17/2019   FERRITIN 24 10/17/2019   Attestation Statements:   Reviewed by clinician on day of visit: allergies, medications, problem list, medical history, surgical history, family history, social history, and previous encounter notes.  I, Water quality scientist, CMA, am acting as transcriptionist for Briscoe Deutscher, DO  I have reviewed the above documentation for accuracy and completeness, and I agree with the above. Briscoe Deutscher, DO

## 2020-04-02 MED ORDER — TOPIRAMATE 25 MG PO TABS: 25.0000 mg | ORAL_TABLET | Freq: Two times a day (BID) | ORAL | 0 refills | Status: DC

## 2020-04-03 MED ORDER — FAMOTIDINE 40 MG PO TABS: 40.0000 mg | ORAL_TABLET | Freq: Every day | ORAL | 0 refills | Status: DC

## 2020-04-08 ENCOUNTER — Other Ambulatory Visit: Payer: Self-pay | Admitting: Cardiology

## 2020-04-23 ENCOUNTER — Ambulatory Visit (INDEPENDENT_AMBULATORY_CARE_PROVIDER_SITE_OTHER): Payer: No Typology Code available for payment source | Admitting: Family Medicine

## 2020-05-21 NOTE — Progress Notes (Signed)
Primary Physician/Referring:  Fanny Bien, MD  [name], female    DOB: 22-Oct-1965, 54 y.o.   MRN: [id number]  Chief Complaint  Patient presents with  . Hypertension  . Atrial Fibrillation  . Follow-up    1 year   HPI:    [name]  is a 54 y.o.  African-American female with morbid obesity, history of gastric banding in 2011, due to failure of the same, underwent gastric bypass surgery in Sept 2018, paroxysmal atrial fibrillation, hypertension, morbid obesity, OSA on CPAP.  She presents here for a one-year visit, no new symptoms.  She admits to having gained weight, is concerned about continued weight gain and she is planning on going on a fitness program.  Denies chest pain, states that she is still exercising with a trainer and has no dyspnea, palpitations or chest pain.  Brings in labs from outside.  Past Medical History:  Diagnosis Date  . Adenomyosis 7/07  . Anemia   . Anxiety   . Arthritis   . Atrial fibrillation ([place])   . Compartment syndrome of lower extremity ([place]) 08/13/2014  . Depression   . Dysrhythmia   . GERD (gastroesophageal reflux disease)   . H/O blood clots    superficial blood clots with OCPs  . Hypertension   . Insomnia   . Lower extremity edema   . Sinus congestion    saw ENT, had nasal surgery to clean out nasal area for bacteria, mold  . Sleep apnea   . Uterine fibroid   . Vitamin D deficiency    Past Surgical History:  Procedure Laterality Date  . ABDOMINAL HYSTERECTOMY    . BREAST BIOPSY     x2  . CESAREAN SECTION    . ESOPHAGOGASTRODUODENOSCOPY N/A 06/15/2016   Procedure: ESOPHAGOGASTRODUODENOSCOPY (EGD);  Surgeon: Alphonsa Overall, MD;  Location: [facility];  Service: General;  Laterality: N/A;  . ESOPHAGOGASTRODUODENOSCOPY (EGD) WITH PROPOFOL N/A 09/20/2018   Procedure: ESOPHAGOGASTRODUODENOSCOPY (EGD) WITH PROPOFOL;  Surgeon: Carol Ada, MD;  Location: [place];  Service: Endoscopy;  Laterality: N/A;  .  LAPAROSCOPIC GASTRIC BANDING  2010  . LAPAROSCOPIC GASTRIC BANDING  2011  . LAPAROSCOPY     x2  . LAPAROSCOPY N/A 09/21/2018   Procedure: LAPAROSCOPY DIAGNOSTIC;  Surgeon: Greer Pickerel, MD;  Location: [place];  Service: General;  Laterality: N/A;  . MOUTH SURGERY    . NASAL SINUS SURGERY  4/13  . PARTIAL KNEE ARTHROPLASTY Right 06/26/2016   Procedure: RIGHT UNICOMPARTMENTAL KNEE CODYLE AND PLATEAU MEDIAL COMPARTMENT;  Surgeon: Renette Butters, MD;  Location: [place];  Service: Orthopedics;  Laterality: Right;  . ROUX-EN-Y GASTRIC BYPASS  2018   Social History   Tobacco Use  . Smoking status: Never Smoker  . Smokeless tobacco: Never Used  Substance Use Topics  . Alcohol use: Yes    Alcohol/week: 3.0 - 4.0 standard drinks    Types: 3 - 4 Standard drinks or equivalent per week  Marital Status: Divorced  ROS  Review of Systems  Constitutional: Positive for weight gain.  Cardiovascular: Negative for chest pain, dyspnea on exertion and leg swelling.  Respiratory: Positive for sleep disturbances due to breathing (sleep apnea).   Gastrointestinal: Negative for melena.   Objective   Vitals with BMI 05/22/2020 03/27/2020 02/05/2020  Height 5' 9"  5' 9"  5' 9"   Weight 290 lbs 290 lbs 283 lbs  BMI 42.81 63.87 56.43  Systolic 329 518 841  Diastolic 64 88 78  Pulse 69 61 64    Blood pressure 136/64, pulse 69, resp. rate 16, height 5' 9"  (1.753 m), weight 290 lb (131.5 kg), last menstrual period 08/10/2005, SpO2 96 %. Body mass index is 42.83 kg/m.   Physical Exam Constitutional:      General: She is not in acute distress.    Appearance: She is well-developed.     Comments: Morbidly obese  Eyes:     Conjunctiva/sclera: Conjunctivae normal.  Neck:     Thyroid: No thyromegaly.     Comments: Short neck and difficult to evaluate JVP Cardiovascular:     Rate and Rhythm: Normal rate and regular rhythm.     Pulses:          Carotid pulses are 2+ on the right side and 2+  on the left side.      Dorsalis pedis pulses are 2+ on the right side and 2+ on the left side.       Posterior tibial pulses are 2+ on the right side and 2+ on the left side.     Heart sounds: Normal heart sounds. No murmur heard.  No gallop.      Comments: Femoral and popliteal pulse difficult to feel due to patient's body habitus.  Pulmonary:     Effort: Pulmonary effort is normal.     Breath sounds: Normal breath sounds.  Abdominal:     General: Bowel sounds are normal.     Palpations: Abdomen is soft.     Comments: Obese. Pannus present  Musculoskeletal:        General: Normal range of motion.  Skin:    General: Skin is warm and dry.  Neurological:     Mental Status: She is alert.    Radiology: No results found.  Laboratory examination:   Recent Labs    10/17/19 1216 11/15/19 1304  NA 138 140  K 4.8 4.6  CL 101 104  CO2 24 26  GLUCOSE 100* 95  BUN 16 11  CREATININE 0.85 0.87  CALCIUM 9.2 9.4  GFRNONAA 78 76  GFRAA 90 88   CMP Latest Ref Rng & Units 11/15/2019 10/17/2019 09/20/2018  Glucose 65 - 99 mg/dL 95 100(H) 99  BUN 6 - 24 mg/dL 11 16 <5(L)  Creatinine 0.57 - 1.00 mg/dL 0.87 0.85 0.81  Sodium 134 - 144 mmol/L 140 138 141  Potassium 3.5 - 5.2 mmol/L 4.6 4.8 3.7  Chloride 96 - 106 mmol/L 104 101 105  CO2 20 - 29 mmol/L 26 24 28   Calcium 8.7 - 10.2 mg/dL 9.4 9.2 9.1  Total Protein 6.0 - 8.5 g/dL 6.6 6.9 6.2(L)  Total Bilirubin 0.0 - 1.2 mg/dL 0.3 0.4 0.3  Alkaline Phos 39 - 117 IU/L 133(H) 130(H) 82  AST 0 - 40 IU/L 21 27 16   ALT 0 - 32 IU/L 17 22 12    CBC Latest Ref Rng & Units 10/17/2019 09/20/2018 09/19/2018  WBC 3.4 - 10.8 x10E3/uL 5.8 4.5 4.3  Hemoglobin 11.1 - 15.9 g/dL 12.6 11.5(L) 11.0(L)  Hematocrit 34.0 - 46.6 % 38.7 37.1 35.5(L)  Platelets 150 - 450 x10E3/uL 370 292 269   Lipid Panel  No results found for: CHOL, TRIG, HDL, CHOLHDL, VLDL, LDLCALC, LDLDIRECT HEMOGLOBIN A1C Lab Results  Component Value Date   HGBA1C 5.5 10/17/2019    TSH Recent Labs    10/17/19 1216  TSH 1.580   External labs:  Labs 05/10/2020:  Sodium 139, potassium 4.2, BUN 12, creatinine 0.92, EGFR >60 mL  Total cholesterol 169, triglycerides 77, HDL 72, LDL 82.  Vitamin D 21.9.  TSH 0.79, A1c 5.2%.  Hb 12.8/HCT 40.8, platelets 358, normal indicis. 01/01/2020: RBC 4.06, hemoglobin 12.2, hematocrit 36.1, CBC otherwise normal  09/01/2019: HDL 92, triglycerides 64, total cholesterol 168, calculated LDL 63.2  Medications and allergies   Allergies  Allergen Reactions  . Flagyl [Metronidazole] Anaphylaxis  . Levaquin [Levofloxacin] Other (See Comments)    Pains and aches throughout body, pt states cant move well   Current Outpatient Medications  Medication Instructions  . ALPRAZolam (XANAX) 0.5 MG tablet SMARTSIG:1-2 Tablet(s) By Mouth 1 to 2 Times Daily PRN  . apixaban (ELIQUIS) 5 mg, Oral, 2 times daily  . Cyanocobalamin (B-12 COMPLIANCE INJECTION) 1000 MCG/ML KIT Injection  . diltiazem (CARDIZEM CD) 180 MG 24 hr capsule No dose, route, or frequency recorded.  . escitalopram (LEXAPRO) 30 mg, Oral, Daily  . famotidine (PEPCID) 40 mg, Oral, Daily  . flecainide (TAMBOCOR) 150 mg, Oral, 2 times daily  . HYDROcodone-acetaminophen (NORCO/VICODIN) 5-325 MG tablet 1 tablet, Oral, Every 6 hours PRN  . hydrocortisone 2.5 % cream Apply BID to affected areas 1 week on 1 week off  . ketoconazole (NIZORAL) 2 % cream Apply BID to affected areas for 3 weeks  . levocetirizine (XYZAL) 5 mg, Oral, Every evening  . Melatonin 10 MG CAPS 1 capsule, Oral, Daily  . metoprolol succinate (TOPROL-XL) 50 mg, Oral, Daily, Take with or immediately following a meal.  . Multiple Vitamins-Minerals (AIR[place]ORNE GUMMIES) CHEW Oral  . spironolactone (ALDACTONE) 25 mg, Oral, Daily  . Trolamine Salicylate (BLUE-EMU HEMP EX) Oral  . valsartan (DIOVAN) 160 mg, Oral, Daily    Cardiac Studies:   Lexiscan Sestamibi stress test 05/11/2014: 1. The resting  electrocardiogram demonstrated normal sinus rhythm and normal resting conduction.  The stress electrocardiogram was non-diagnostic due to low heart rate.  pharmacologic stress test. 2. The perfusion imaging study demonstrates the left ventricle to be mildly dilated with a left ventricular end-diastolic volume of 570 mL.  Otherwise there is no evidence of ischemia, normal wall motion, LVEF 55%.  This represents a low risk study.  Echocardiogram 05/09/2014: Left ventricle cavity is normal in size. Mild concentric hypertrophy of the left ventricle. Normal global wall motion. Normal diastolic filling pattern. Calculated EF 64%. Insignificant pericardial effusion with clear fluids.  Sleep study 06/13/2014: Moderate obstructive sleep apnea, successfully treated with CPAP. Dr. Rexene Alberts  EKG:   EKG 05/22/2020: Normal sinus rhythm at rate of 64 bpm, left atrial normality, normal axis.  Poor R wave progression, probably normal variant.  Normal QT interval.    EKG 05/23/2019: Normal sinus rhythm at rate of 57 bpm, biatrial abnormality, normal axis.  Poor R-wave progression, probably normal variant.  Normal QT interval.  No evidence of ischemia. No significant change from   EKG 06/03/2018, previously LAE only.    EKG 05/03/2014: Atypical Atrial flutter with variable ventricular response, 110 bpm.  Normal axis, borderline low voltage complexes, nonspecific T abnormality.   Assessment     ICD-10-CM   1. Paroxysmal atrial fibrillation (HCC)  I48.0 EKG 12-Lead    apixaban (ELIQUIS) 5 MG TABS tablet    flecainide (TAMBOCOR) 150 MG tablet    metoprolol succinate (TOPROL-XL) 50 MG 24 hr tablet  2. Essential hypertension  I10 valsartan (DIOVAN) 160 MG tablet    spironolactone (ALDACTONE) 25 MG tablet  3. Class 3 severe obesity due to excess calories with serious comorbidity and body mass index (BMI) of 40.0 to 44.9  in adult [facility])  E66.01    Z68.41    Meds ordered this encounter  Medications  . apixaban (ELIQUIS)  5 MG TABS tablet    Sig: Take 1 tablet (5 mg total) by mouth 2 (two) times daily.    Dispense:  180 tablet    Refill:  3  . flecainide (TAMBOCOR) 150 MG tablet    Sig: Take 1 tablet (150 mg total) by mouth 2 (two) times daily.    Dispense:  180 tablet    Refill:  3  . metoprolol succinate (TOPROL-XL) 50 MG 24 hr tablet    Sig: Take 1 tablet (50 mg total) by mouth daily. Take with or immediately following a meal.    Dispense:  90 tablet    Refill:  3  . valsartan (DIOVAN) 160 MG tablet    Sig: Take 1 tablet (160 mg total) by mouth daily.    Dispense:  90 tablet    Refill:  3  . spironolactone (ALDACTONE) 25 MG tablet    Sig: Take 1 tablet (25 mg total) by mouth daily.    Dispense:  90 tablet    Refill:  3   Medications Discontinued During This Encounter  Medication Reason  . CALCIUM PO Patient Preference  . Multiple Vitamins-Minerals (MULTIVITAMIN WITH MINERALS) tablet Patient Preference  . sulfamethoxazole-trimethoprim (BACTRIM DS) 800-160 MG tablet Patient Preference  . topiramate (TOPAMAX) 25 MG tablet Patient Preference  . ELIQUIS 5 MG TABS tablet Reorder  . flecainide (TAMBOCOR) 150 MG tablet Reorder  . valsartan (DIOVAN) 160 MG tablet Reorder  . spironolactone (ALDACTONE) 25 MG tablet Reorder  . metoprolol succinate (TOPROL-XL) 50 MG 24 hr tablet Reorder     Recommendations:   [name]  is a 54 y.o. African-American female with morbid obesity, history of gastric banding in 2011, due to failure of the same, underwent gastric bypass surgery in Sept 2018, paroxysmal atrial fibrillation, hypertension, morbid obesity, OSA on CPAP.  She is here on annual visit, unfortunately has not been compliant with her CPAP.  I discussed with her regarding the importance of CPAP and recurrence of atrial fibrillation.  With regard to hypertension, blood pressure is well controlled.  She has not had any recurrence of atrial fibrillation and she is presently on high-dose flecainide  without any complications and does continue to exercise.  She brings in outpatient labs and she is wondering whether she is going to program where they could do changes with her hormones as her testosterone levels are very low and lipids are very high and we discussed that although it is not my expertise that in view of the fact that she has gained weight, has not been using CPAP, also drinking at least 2 to 3 glasses of wine a day, will all predispose her to higher risk of A. Fib.  We also discussed regarding family history as her mother who is 2 years of age recently had myocardial infarction and also has A. fib and had developed aneurysm following MI.  We discussed that she is presently 54 years of age and her family history may not be very prudent at this time with regard to patient herself.  No change in the medications were done today.  No change in physical exam on the EKG.  I will see her back in a year.  This was a 40-minute encounter.    Adrian Prows, MD, [facility] 05/22/2020, 10:13 AM Office: [phone]

## 2020-05-22 ENCOUNTER — Ambulatory Visit: Payer: No Typology Code available for payment source | Admitting: Cardiology

## 2020-05-22 ENCOUNTER — Encounter: Payer: Self-pay | Admitting: Cardiology

## 2020-05-22 ENCOUNTER — Other Ambulatory Visit: Payer: Self-pay

## 2020-05-22 VITALS — BP 136/64 | HR 69 | Resp 16 | Ht 69.0 in | Wt 290.0 lb

## 2020-05-22 DIAGNOSIS — I10 Essential (primary) hypertension: Secondary | ICD-10-CM

## 2020-05-22 DIAGNOSIS — Z6841 Body Mass Index (BMI) 40.0 and over, adult: Secondary | ICD-10-CM

## 2020-05-22 DIAGNOSIS — I480 Paroxysmal atrial fibrillation: Secondary | ICD-10-CM

## 2020-05-22 MED ORDER — VALSARTAN 160 MG PO TABS: 160.0000 mg | ORAL_TABLET | Freq: Every day | ORAL | 3 refills | Status: DC

## 2020-05-22 MED ORDER — FLECAINIDE ACETATE 150 MG PO TABS: 150.0000 mg | ORAL_TABLET | Freq: Two times a day (BID) | ORAL | 3 refills | Status: DC

## 2020-05-22 MED ORDER — SPIRONOLACTONE 25 MG PO TABS: 25.0000 mg | ORAL_TABLET | Freq: Every day | ORAL | 3 refills | Status: DC

## 2020-05-22 MED ORDER — METOPROLOL SUCCINATE ER 50 MG PO TB24: 50.0000 mg | ORAL_TABLET | Freq: Every day | ORAL | 3 refills | Status: DC

## 2020-05-22 MED ORDER — APIXABAN 5 MG PO TABS: 5.0000 mg | ORAL_TABLET | Freq: Two times a day (BID) | ORAL | 3 refills | Status: DC

## 2020-07-06 IMAGING — MG DIGITAL DIAGNOSTIC BILAT W/ TOMO W/ CAD
8 of 15 series · 8 of 40 positions shown · non-contrast
Comparison: Previous exam(s).

CLINICAL DATA: Patient complains of a palpable abnormality in the
left breast.

EXAM:
DIGITAL DIAGNOSTIC BILATERAL MAMMOGRAM WITH CAD AND TOMO
ULTRASOUND LEFT BREAST

[R CC synth-2D]
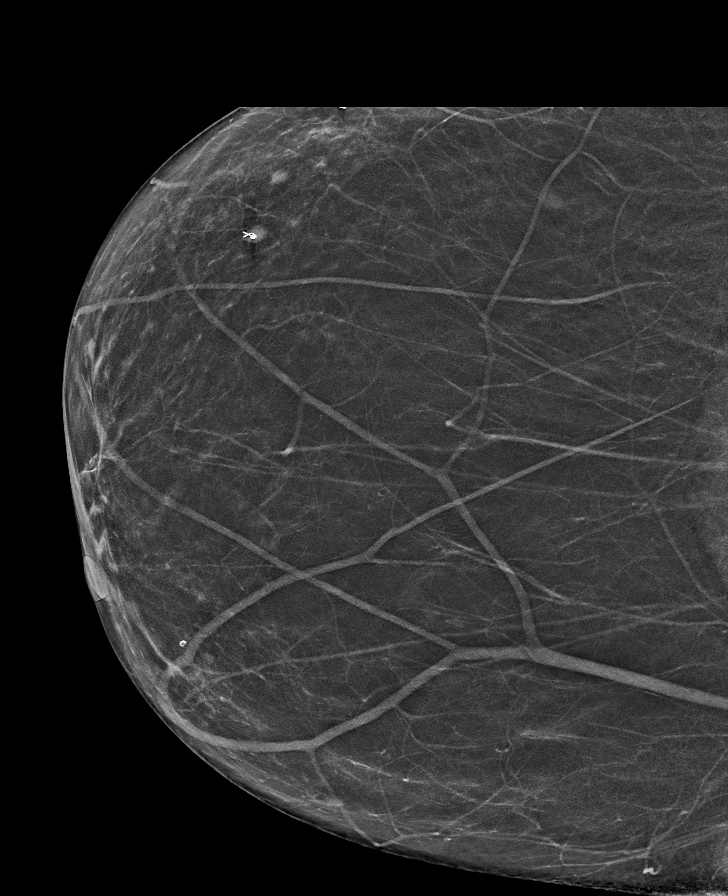

[L CC synth-2D]
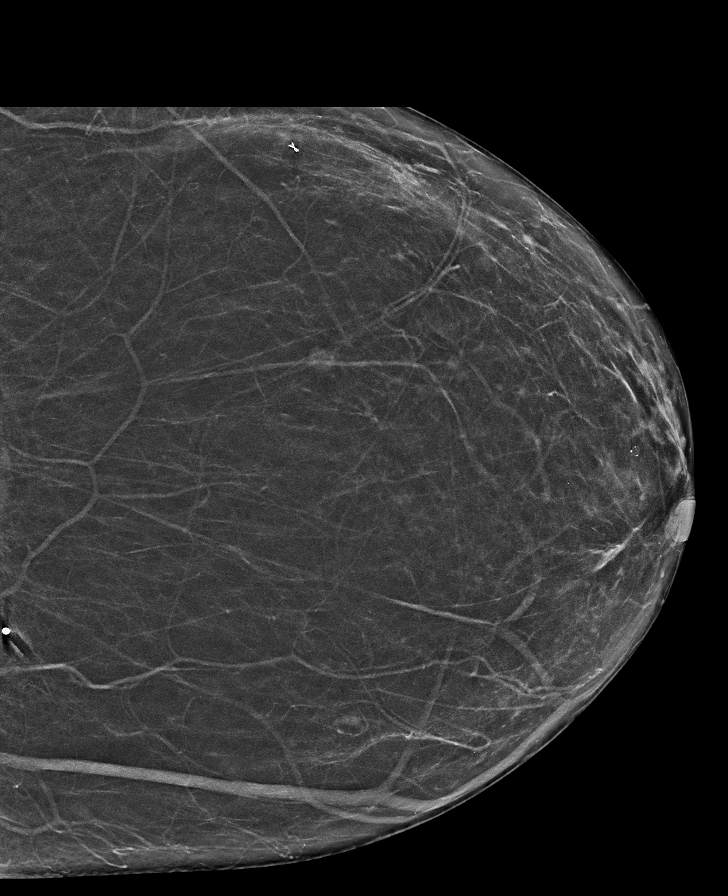

[L MLO synth-2D (1 of 2)]
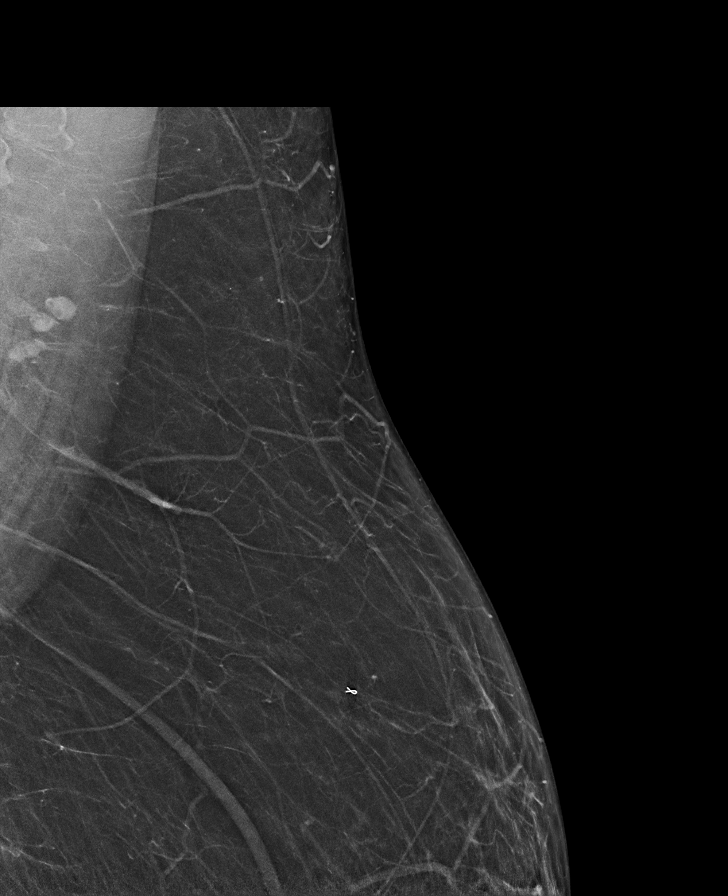

[R MLO synth-2D (1 of 2)]
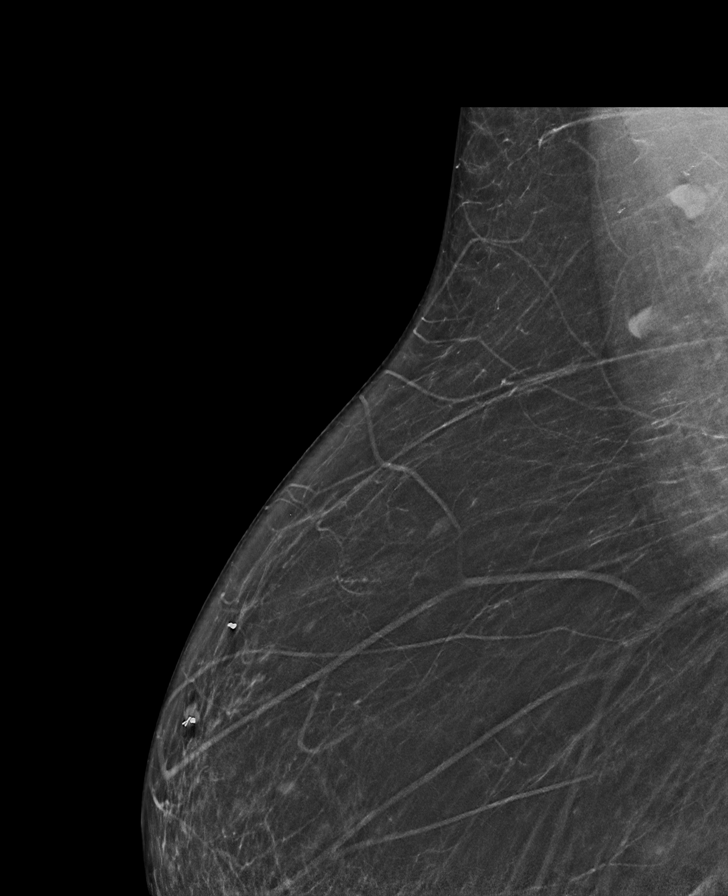

[L MLO synth-2D (2 of 2)]
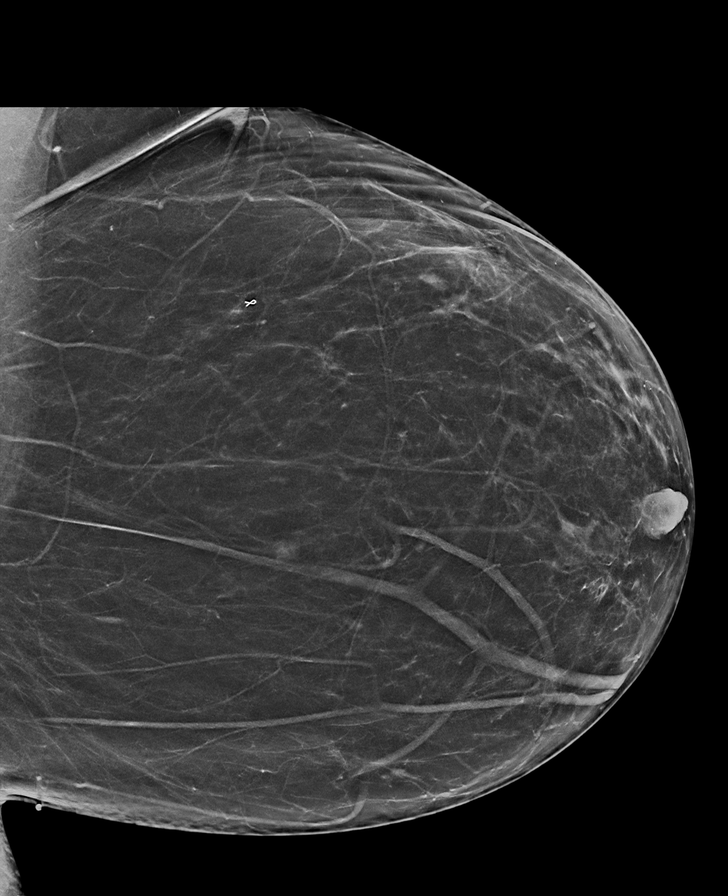

[R MLO synth-2D (2 of 2)]
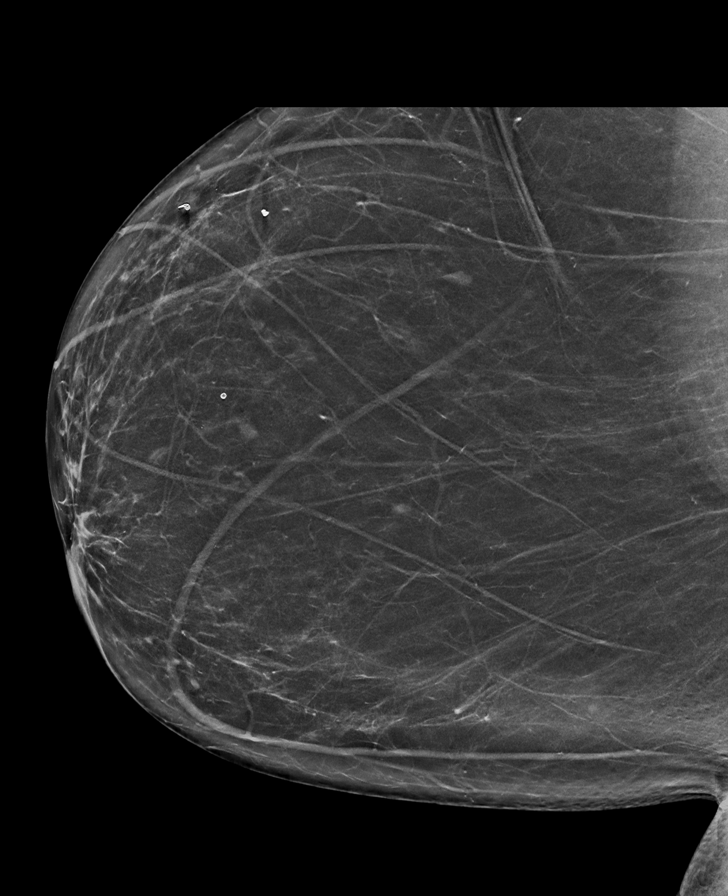

[L TAN synth-2D]
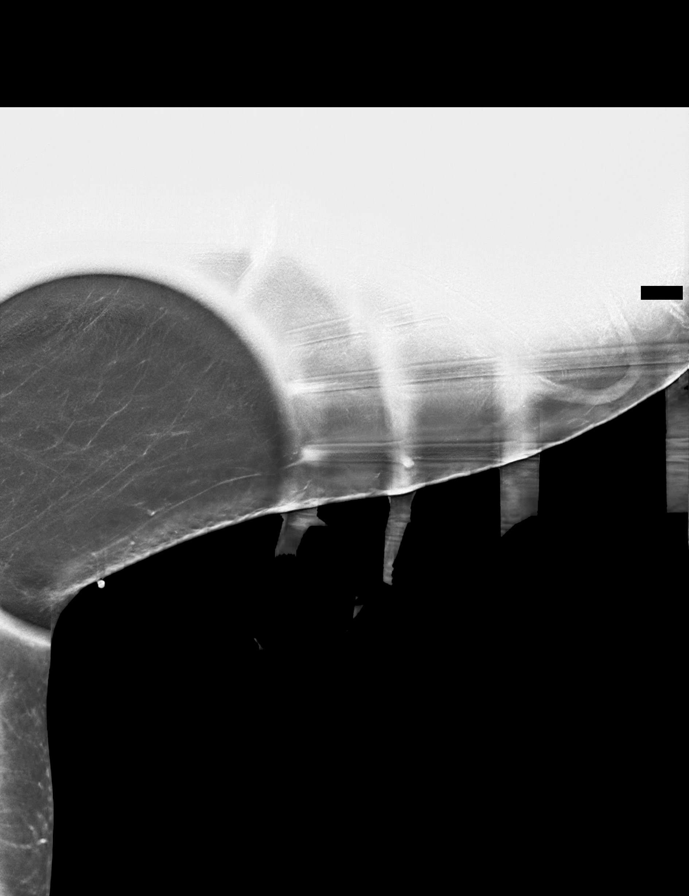

[L MLO tomo · tomo slice 55/80.0]
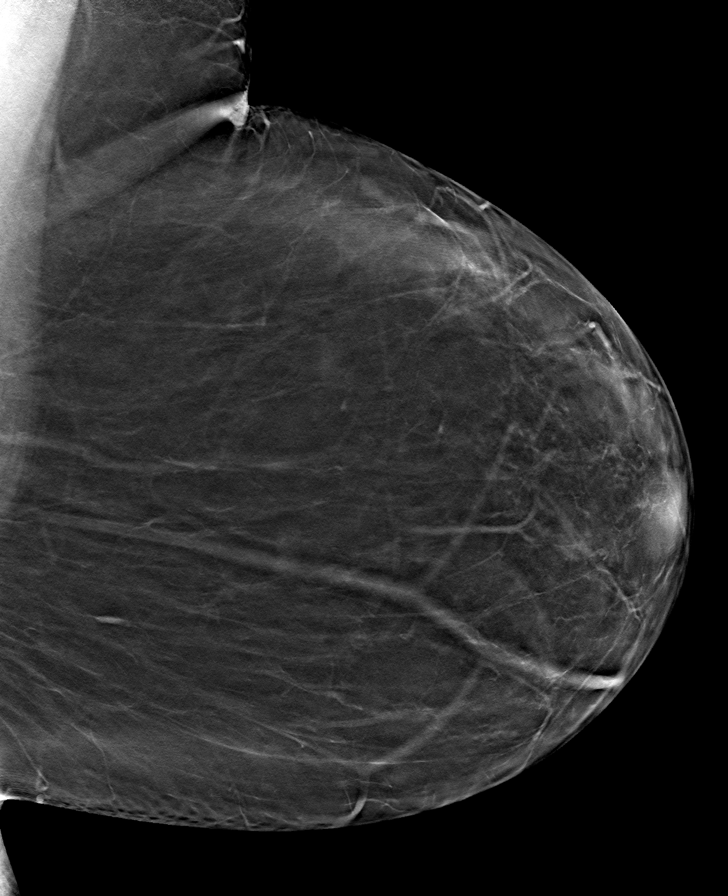

[8 of 40 positions shown; findings below may reference images not displayed]

ACR Breast Density Category b: There are scattered areas of
fibroglandular density.
FINDINGS: No suspicious mass or malignant type microcalcifications identified
in the right breast.

On the spot tangential view of the left breast there is a
superficial area of skin thickening where the patient complains of a
palpable abnormality. No additional abnormality is seen in the left
breast. There are no malignant type microcalcifications.

Mammographic images were processed with CAD.

On physical exam, I palpate an area of superficial thickening in the
left breast at 8 o'clock in the inframammary fold. There is a
visible black pore to the skin surface.

Targeted ultrasound is performed, showing a superficial hypoechoic
mass in the dermal/subdermal left breast at 8 o'clock in the
inframammary fold with a tract to the skin surface measuring 4 x 4 x
5 mm. There is no internal vascularity. It is felt to likely be a
sebaceous cyst.
IMPRESSION: Probable benign sebaceous cyst in the 8 o'clock region of the left
breast in the inframammary fold.

RECOMMENDATION:
Short-term interval follow-up left breast ultrasound in 3 months is
recommended.

I have discussed the findings and recommendations with the patient.
If applicable, a reminder letter will be sent to the patient
regarding the next appointment.

BI-RADS CATEGORY  3: Probably benign.

## 2020-07-27 ENCOUNTER — Encounter (INDEPENDENT_AMBULATORY_CARE_PROVIDER_SITE_OTHER): Payer: Self-pay | Admitting: Family Medicine

## 2020-07-29 NOTE — Telephone Encounter (Signed)
FYI

## 2020-10-11 ENCOUNTER — Other Ambulatory Visit: Payer: Self-pay | Admitting: Cardiology

## 2020-11-19 IMAGING — US US ABDOMEN COMPLETE
1 series · 14 of 25 positions shown · non-contrast
Comparison: 05/25/2014

CLINICAL DATA: Epigastric pain after medications.

EXAM:
ABDOMEN ULTRASOUND COMPLETE

[Series 1: us abdomen complete · 0.25mm/px · 98 acquisitions, 14 frames shown]
[im 1/98]
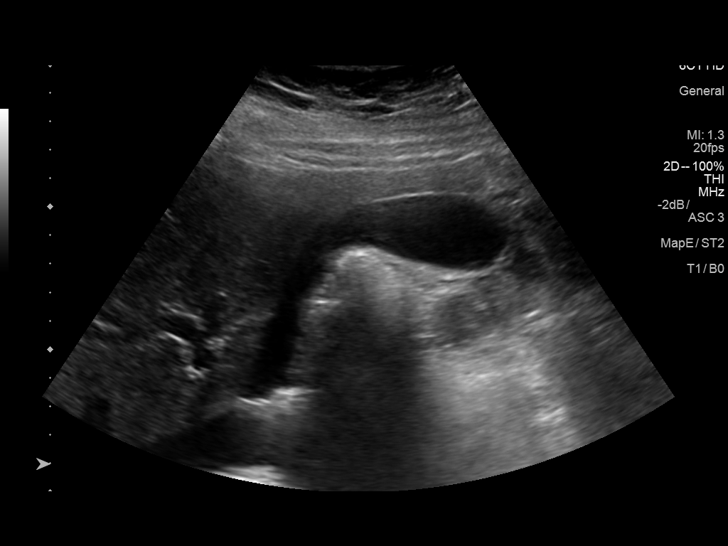
[im 9/98]
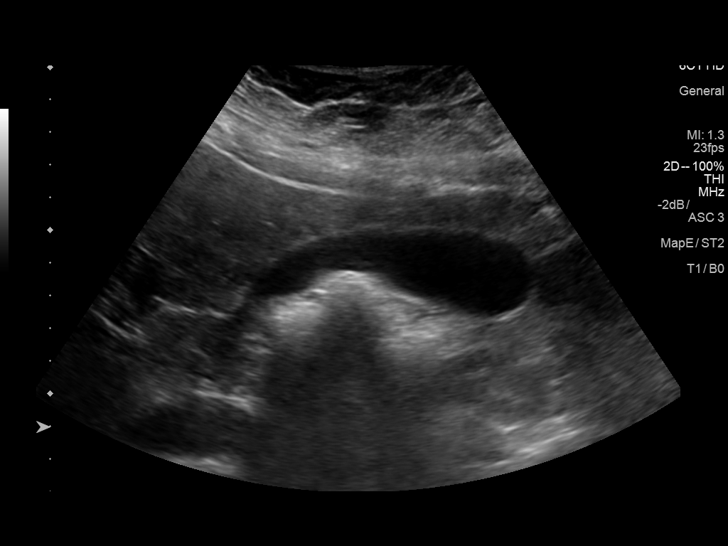
[im 17/98]
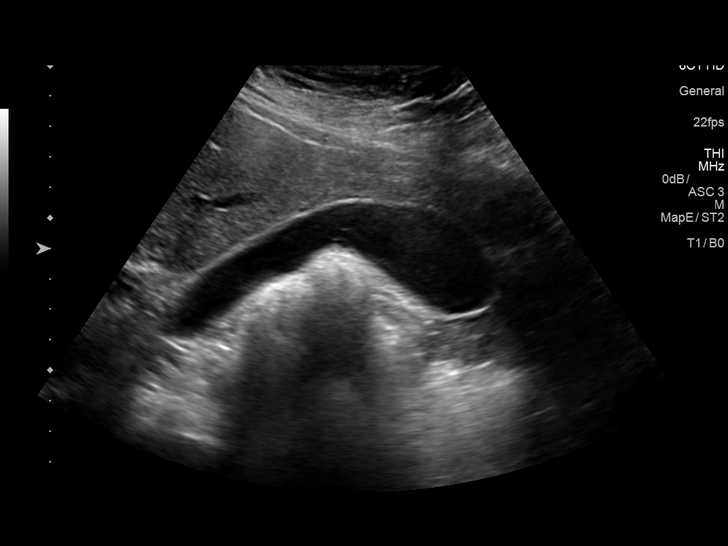
[im 25/98]
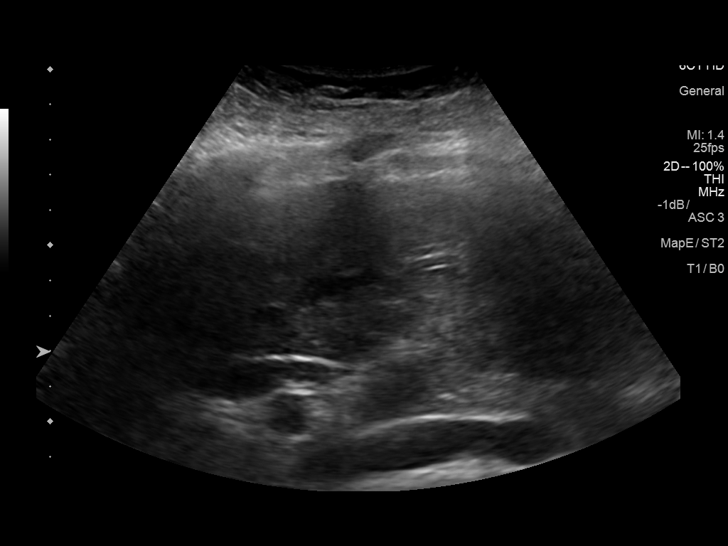
[im 33/98]
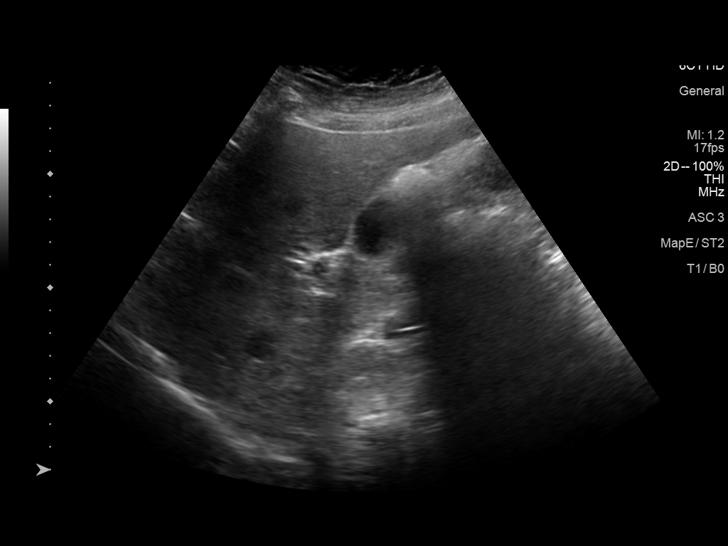
[im 37/98]
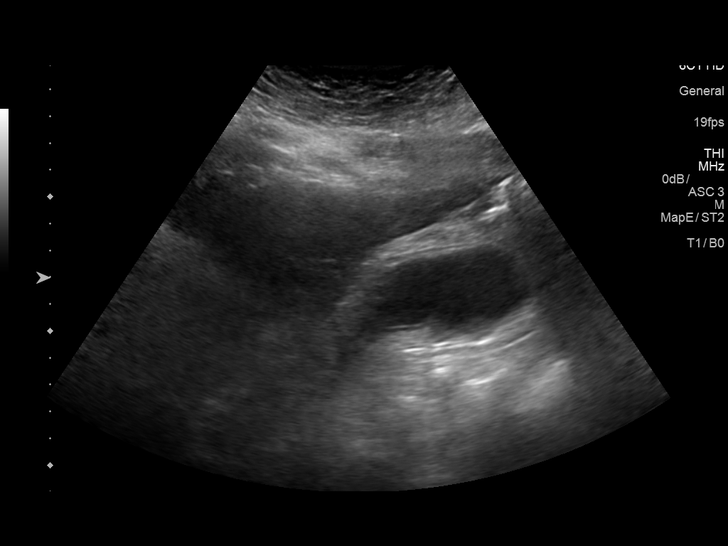
[im 45/98]
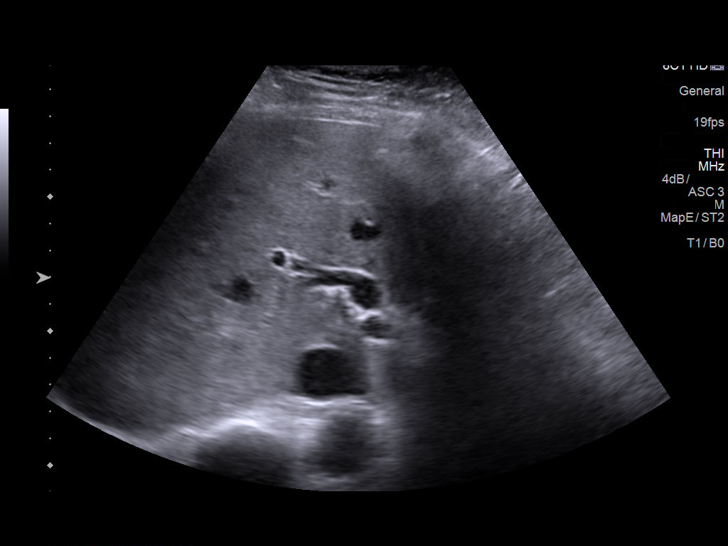
[im 53/98]
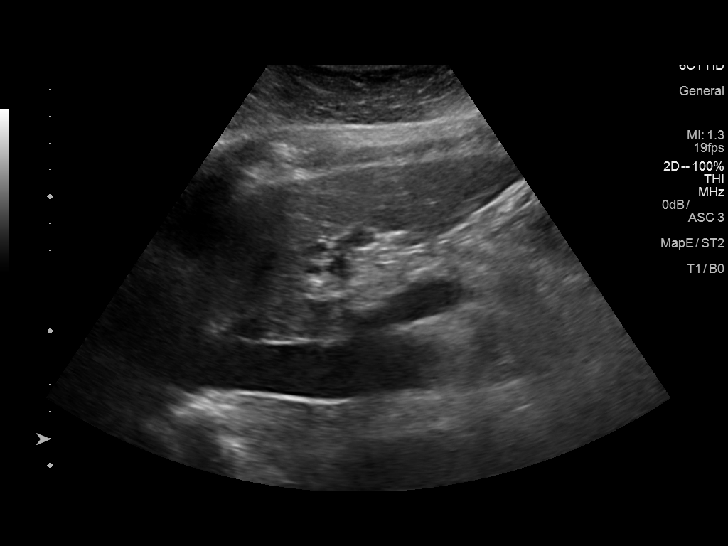
[im 61/98]
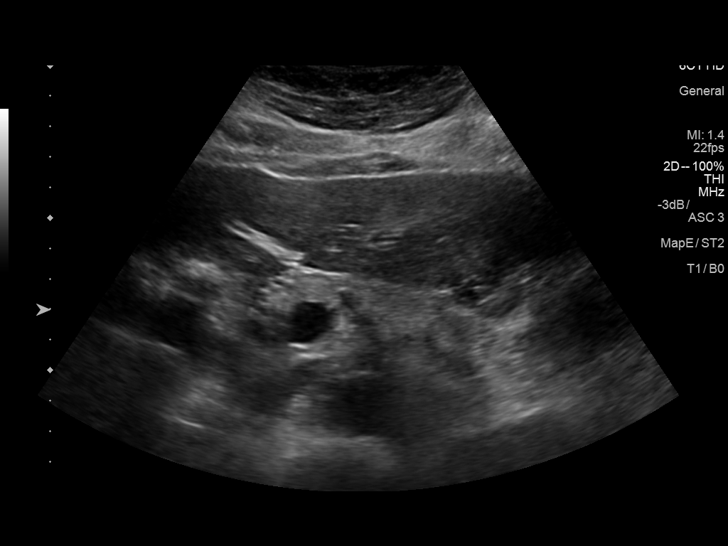
[im 65/98]
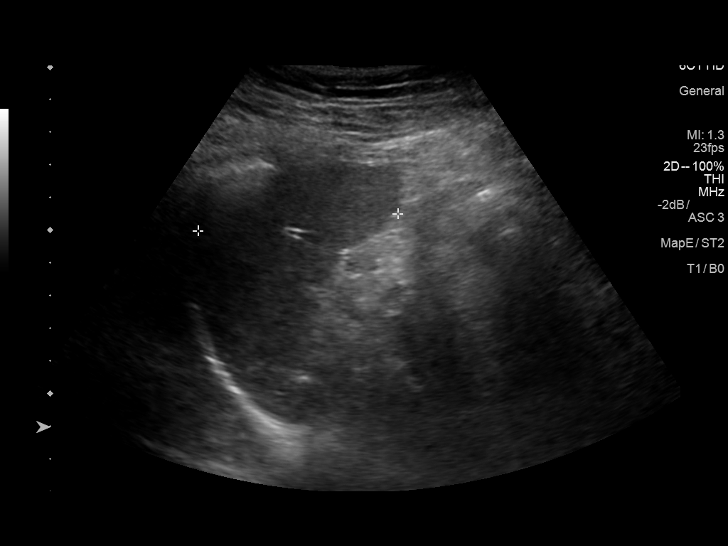
[im 73/98]
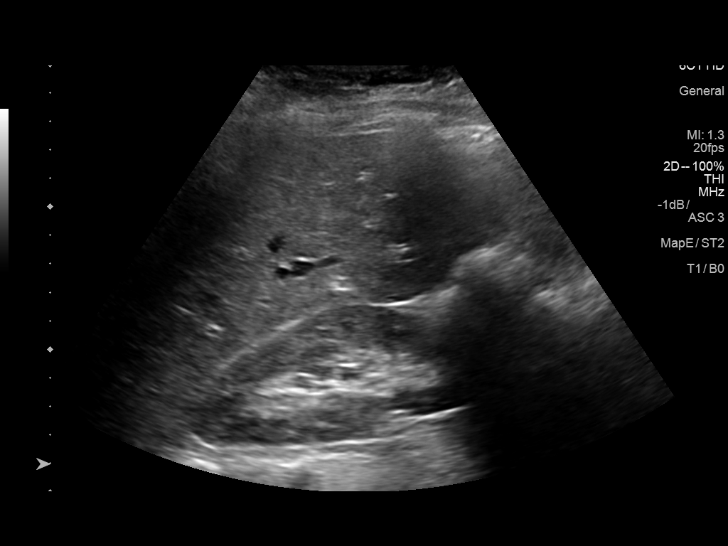
[im 81/98]
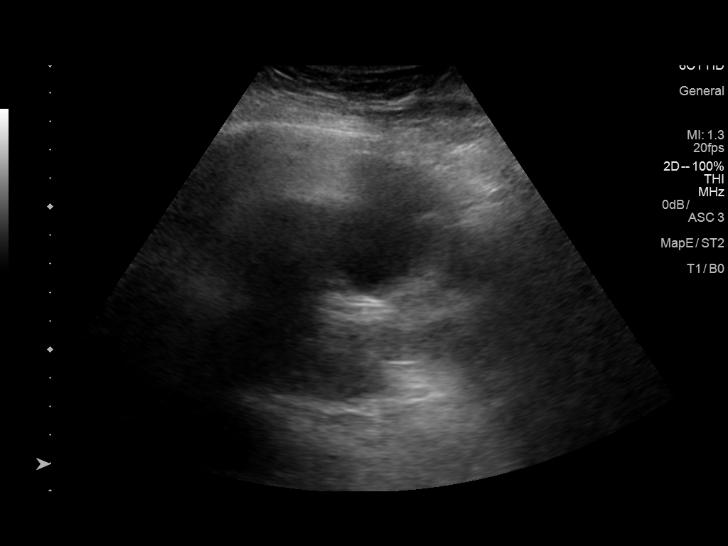
[im 89/98]
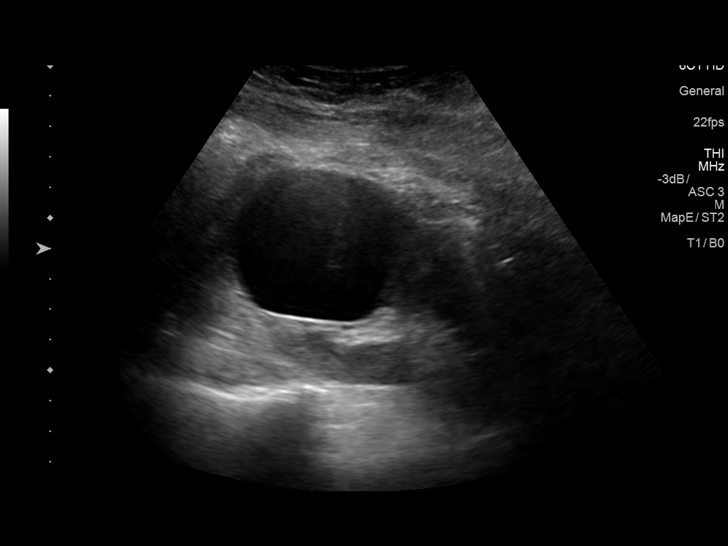
[im 98/98]
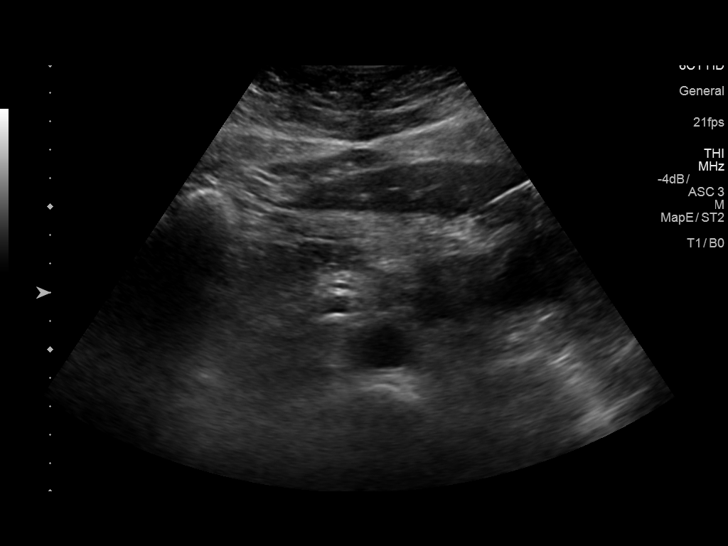

[14 of 25 positions shown; findings below may reference images not displayed]

FINDINGS: Gallbladder: No gallstones or wall thickening visualized. No
sonographic Murphy sign noted by sonographer.

Common bile duct: Diameter: 12 mm.

Liver: No focal lesion identified. Within normal limits in
parenchymal echogenicity. Portal vein is patent on color Doppler
imaging with normal direction of blood flow towards the liver.
Intrahepatic bile duct dilatation

IVC: No abnormality visualized.

Pancreas: Dilated main pancreatic duct at 3 mm. No mass or
inflammation is seen.

Spleen: Size and appearance within normal limits.

Right Kidney: Length: 10.6 cm. Echogenicity within normal limits. No
mass or hydronephrosis visualized.

Left Kidney: Length: 13 cm. Size asymmetry is attributed to a simple
cyst measuring up to 5.5 cm. No solid mass or hydronephrosis.

Abdominal aorta: No aneurysm visualized.
IMPRESSION: Intra and extrahepatic bile duct dilatation and main pancreatic duct
dilatation concerning for obstruction at the pancreatic head or
ampulla. Recommend enhanced MRCP or abdominal CT.

## 2020-12-10 NOTE — Progress Notes (Signed)
Primary Physician/Referring:  Fanny Bien, MD  [name], female    DOB: May 26, 1966, 55 y.o.   MRN: [id number]  Chief Complaint  Patient presents with  . Atrial Fibrillation   HPI:    [name]  is a 55 y.o.  African-American female with morbid obesity, history of gastric banding in 2011, due to failure of the same, underwent gastric bypass surgery in Sept 2018, paroxysmal atrial fibrillation no known recurrence since initiation of flecainide and presently on Eliquis, hypertension, morbid obesity, OSA on CPAP.  Patient was last seen 05/22/2020 by Dr. Einar Gip who at that time recommended follow-up in 1 year.  However patient now presents for follow-up of atrial fibrillation. Patient reports over the last 2 days she has received 7 alerts from her Apple watch indicating "potential atrial fibrillation". Patient was relatively asymptomatic during these events, with associated palpitations, but no chest pain, dyspnea, dizziness, syncope, near syncope.  Patient reports she has been under significant amount of stress lately as her elderly mother lives with her and patient is her primary caretaker, as well as patient's mother has been in and out of the hospital over the last few weeks.  Patient reports prior to 2 days ago she had had no recurrence of atrial fibrillation since being on flecainide.  Patient reports she has had increased fatigue ongoing for the last several months, but this has not been worse over the last few days.  She continues to exercise approximately 3 days/week with a trainer.  She also has been working on improving diet and losing weight, but does continue to struggle.  Past Medical History:  Diagnosis Date  . Adenomyosis 7/07  . Anemia   . Anxiety   . Arthritis   . Atrial fibrillation ([place])   . Compartment syndrome of lower extremity ([place]) 08/13/2014  . Depression   . Dysrhythmia   . GERD (gastroesophageal reflux disease)   . H/O blood clots     superficial blood clots with OCPs  . Hypertension   . Insomnia   . Lower extremity edema   . Sinus congestion    saw ENT, had nasal surgery to clean out nasal area for bacteria, mold  . Sleep apnea   . Uterine fibroid   . Vitamin D deficiency    Past Surgical History:  Procedure Laterality Date  . ABDOMINAL HYSTERECTOMY    . BREAST BIOPSY     x2  . CESAREAN SECTION    . ESOPHAGOGASTRODUODENOSCOPY N/A 06/15/2016   Procedure: ESOPHAGOGASTRODUODENOSCOPY (EGD);  Surgeon: Alphonsa Overall, MD;  Location: [facility];  Service: General;  Laterality: N/A;  . ESOPHAGOGASTRODUODENOSCOPY (EGD) WITH PROPOFOL N/A 09/20/2018   Procedure: ESOPHAGOGASTRODUODENOSCOPY (EGD) WITH PROPOFOL;  Surgeon: Carol Ada, MD;  Location: [place];  Service: Endoscopy;  Laterality: N/A;  . LAPAROSCOPIC GASTRIC BANDING  2010  . LAPAROSCOPIC GASTRIC BANDING  2011  . LAPAROSCOPY     x2  . LAPAROSCOPY N/A 09/21/2018   Procedure: LAPAROSCOPY DIAGNOSTIC;  Surgeon: Greer Pickerel, MD;  Location: [place];  Service: General;  Laterality: N/A;  . MOUTH SURGERY    . NASAL SINUS SURGERY  4/13  . PARTIAL KNEE ARTHROPLASTY Right 06/26/2016   Procedure: RIGHT UNICOMPARTMENTAL KNEE CODYLE AND PLATEAU MEDIAL COMPARTMENT;  Surgeon: Renette Butters, MD;  Location: [place];  Service: Orthopedics;  Laterality: Right;  . ROUX-EN-Y GASTRIC BYPASS  2018   Social History   Tobacco Use  . Smoking status: Never Smoker  .  Smokeless tobacco: Never Used  Substance Use Topics  . Alcohol use: Yes    Alcohol/week: 3.0 - 4.0 standard drinks    Types: 3 - 4 Standard drinks or equivalent per week  Marital Status: Divorced  ROS  Review of Systems  Constitutional: Positive for weight gain. Negative for malaise/fatigue.  Cardiovascular: Positive for palpitations. Negative for chest pain, claudication, dyspnea on exertion, leg swelling, near-syncope, orthopnea, paroxysmal nocturnal dyspnea and syncope.  Respiratory:  Positive for sleep disturbances due to breathing (sleep apnea). Negative for shortness of breath.   Hematologic/Lymphatic: Does not bruise/bleed easily.  Gastrointestinal: Negative for melena.  Neurological: Negative for dizziness and weakness.   Objective   Vitals with BMI 12/11/2020 05/22/2020 03/27/2020  Height 5' 9"  5' 9"  5' 9"   Weight 281 lbs 290 lbs 290 lbs  BMI 41.48 03.00 92.33  Systolic 007 622 633  Diastolic 72 64 88  Pulse 69 69 61    Blood pressure 110/72, pulse 69, temperature (!) 97 F (36.1 C), height 5' 9"  (1.753 m), weight 281 lb (127.5 kg), last menstrual period 08/10/2005, SpO2 96 %. Body mass index is 41.5 kg/m.   Physical Exam Vitals reviewed.  Constitutional:      General: She is not in acute distress.    Appearance: She is well-developed.     Comments: Morbidly obese  HENT:     Head: Normocephalic and atraumatic.  Neck:     Thyroid: No thyromegaly.     Comments: Short neck and difficult to evaluate JVP Cardiovascular:     Rate and Rhythm: Normal rate and regular rhythm.     Pulses: Intact distal pulses.          Carotid pulses are 2+ on the right side and 2+ on the left side.      Dorsalis pedis pulses are 2+ on the right side and 2+ on the left side.       Posterior tibial pulses are 2+ on the right side and 2+ on the left side.     Heart sounds: Normal heart sounds, S1 normal and S2 normal. No murmur heard. No gallop.      Comments: Femoral and popliteal pulse difficult to feel due to patient's body habitus.  Pulmonary:     Effort: Pulmonary effort is normal. No respiratory distress.     Breath sounds: Normal breath sounds. No wheezing, rhonchi or rales.  Abdominal:     General: Bowel sounds are normal.     Palpations: Abdomen is soft.     Comments: Pannus present  Musculoskeletal:        General: Normal range of motion.     Right lower leg: No edema.     Left lower leg: No edema.  Skin:    General: Skin is warm and dry.  Neurological:      Mental Status: She is alert.    Radiology: No results found.  Laboratory examination:   No results for input(s): NA, K, CL, CO2, GLUCOSE, BUN, CREATININE, CALCIUM, GFRNONAA, GFRAA in the last 8760 hours. CMP Latest Ref Rng & Units 11/15/2019 10/17/2019 09/20/2018  Glucose 65 - 99 mg/dL 95 100(H) 99  BUN 6 - 24 mg/dL 11 16 <5(L)  Creatinine 0.57 - 1.00 mg/dL 0.87 0.85 0.81  Sodium 134 - 144 mmol/L 140 138 141  Potassium 3.5 - 5.2 mmol/L 4.6 4.8 3.7  Chloride 96 - 106 mmol/L 104 101 105  CO2 20 - 29 mmol/L 26 24 28   Calcium 8.7 - 10.2  mg/dL 9.4 9.2 9.1  Total Protein 6.0 - 8.5 g/dL 6.6 6.9 6.2(L)  Total Bilirubin 0.0 - 1.2 mg/dL 0.3 0.4 0.3  Alkaline Phos 39 - 117 IU/L 133(H) 130(H) 82  AST 0 - 40 IU/L 21 27 16   ALT 0 - 32 IU/L 17 22 12    CBC Latest Ref Rng & Units 10/17/2019 09/20/2018 09/19/2018  WBC 3.4 - 10.8 x10E3/uL 5.8 4.5 4.3  Hemoglobin 11.1 - 15.9 g/dL 12.6 11.5(L) 11.0(L)  Hematocrit 34.0 - 46.6 % 38.7 37.1 35.5(L)  Platelets 150 - 450 x10E3/uL 370 292 269   Lipid Panel  No results found for: CHOL, TRIG, HDL, CHOLHDL, VLDL, LDLCALC, LDLDIRECT HEMOGLOBIN A1C Lab Results  Component Value Date   HGBA1C 5.5 10/17/2019   TSH No results for input(s): TSH in the last 8760 hours. External labs:  Labs 05/10/2020: Sodium 139, potassium 4.2, BUN 12, creatinine 0.92, EGFR >60 mL Total cholesterol 169, triglycerides 77, HDL 72, LDL 82. Vitamin D 21.9. TSH 0.79, A1c 5.2%. Hb 12.8/HCT 40.8, platelets 358, normal indicis.  01/01/2020: RBC 4.06, hemoglobin 12.2, hematocrit 36.1, CBC otherwise normal  09/01/2019: HDL 92, triglycerides 64, total cholesterol 168, calculated LDL 63.2  Medications and allergies   Allergies  Allergen Reactions  . Flagyl [Metronidazole] Anaphylaxis  . Levaquin [Levofloxacin] Other (See Comments)    Pains and aches throughout body, pt states cant move well   Current Outpatient Medications  Medication Instructions  . ALPRAZolam (XANAX) 0.5 MG  tablet SMARTSIG:1-2 Tablet(s) By Mouth 1 to 2 Times Daily PRN  . apixaban (ELIQUIS) 5 mg, Oral, 2 times daily  . Belsomra 10 mg, Oral, at bedtime and repeat x1 PRN  . diltiazem (CARDIZEM CD) 180 MG 24 hr capsule No dose, route, or frequency recorded.  . doxycycline (DORYX) 100 mg, Oral, As needed  . erythromycin ophthalmic ointment 1 application, Daily at bedtime  . escitalopram (LEXAPRO) 30 mg, Oral, Daily  . flecainide (TAMBOCOR) 150 mg, Oral, 2 times daily  . hydrocortisone 2.5 % cream Apply BID to affected areas 1 week on 1 week off  . ketoconazole (NIZORAL) 2 % cream Apply BID to affected areas for 3 weeks  . levocetirizine (XYZAL) 5 mg, Oral, Every evening  . Melatonin 10 MG CAPS 1 capsule, Oral, Daily  . metoprolol succinate (TOPROL-XL) 50 mg, Oral, Daily, Take with or immediately following a meal.  . spironolactone (ALDACTONE) 25 mg, Oral, Daily  . Trolamine Salicylate (BLUE-EMU HEMP EX) Oral  . valsartan (DIOVAN) 160 mg, Oral, Daily    Cardiac Studies:   Lexiscan Sestamibi stress test 05/11/2014: 1. The resting electrocardiogram demonstrated normal sinus rhythm and normal resting conduction.  The stress electrocardiogram was non-diagnostic due to low heart rate.  pharmacologic stress test. 2. The perfusion imaging study demonstrates the left ventricle to be mildly dilated with a left ventricular end-diastolic volume of 588 mL.  Otherwise there is no evidence of ischemia, normal wall motion, LVEF 55%.  This represents a low risk study.  Echocardiogram 05/09/2014: Left ventricle cavity is normal in size. Mild concentric hypertrophy of the left ventricle. Normal global wall motion. Normal diastolic filling pattern. Calculated EF 64%. Insignificant pericardial effusion with clear fluids.  Sleep study 06/13/2014: Moderate obstructive sleep apnea, successfully treated with CPAP. Dr. Rexene Alberts   EKG   EKG 12/11/2020: Sinus rhythm at a rate of 68 bpm.  Biatrial enlargement.  Normal axis.   Nonspecific T wave abnormality.  Normal QT interval.  No evidence of ischemia or underlying injury pattern.  Compared to EKG 05/22/2020, no significant change.   EKG 05/23/2019: Normal sinus rhythm at rate of 57 bpm, biatrial abnormality, normal axis.  Poor R-wave progression, probably normal variant.  Normal QT interval.  No evidence of ischemia. No significant change from   EKG 06/03/2018, previously LAE only.    EKG 05/03/2014: Atypical Atrial flutter with variable ventricular response, 110 bpm.  Normal axis, borderline low voltage complexes, nonspecific T abnormality.   Assessment     ICD-10-CM   1. Essential hypertension  I10   2. Paroxysmal atrial fibrillation (HCC)  I48.0 EKG 12-Lead  3. Class 3 severe obesity due to excess calories with serious comorbidity and body mass index (BMI) of 40.0 to 44.9 in adult (HCC)  E66.01    Z68.41    No orders of the defined types were placed in this encounter.  Medications Discontinued During This Encounter  Medication Reason  . Cyanocobalamin (B-12 COMPLIANCE INJECTION) 1000 MCG/ML KIT Error  . famotidine (PEPCID) 40 MG tablet Error  . HYDROcodone-acetaminophen (NORCO/VICODIN) 5-325 MG tablet Error  . Multiple Vitamins-Minerals (AIRBORNE GUMMIES) CHEW Error    This patients CHA2DS2-VASc Score 2 (HTN, F) and yearly risk of stroke 2.2%.   Recommendations:   [name]  is a 55 y.o. African-American female with morbid obesity, history of gastric banding in 2011, due to failure of the same, underwent gastric bypass surgery in Sept 2018, paroxysmal atrial fibrillation, hypertension, morbid obesity, OSA on CPAP.  Patient was last seen 05/22/2020 by Dr. Einar Gip who at that time recommended follow-up in 1 year.  However patient now presents for follow-up of atrial fibrillation.  Patient remains compliant with flecainide, metoprolol, diltiazem, and Eliquis.  She continues to tolerate Eliquis without bleeding diathesis, will continue this given  CHA2DS2-VASc score of 2.  EKG today reveals normal sinus rhythm at a rate of 68 bpm.  Suspect patient had brief episodes of atrial fibrillation, high levels of stress may have contributed to recurrence.  Discussed at length with patient regarding natural history of paroxysmal atrial fibrillation.  As she is relatively asymptomatic we will not make changes to medications at this time.  Advised patient to continue to monitor for recurrence of atrial fibrillation and to notify our office if this occurs or she develops symptoms.  Could consider medication adjustments if needed in the future.  In regard to hypertension, patient's blood pressure is well controlled, in fact it is soft.  Patient denies symptoms of hypotension.  However advised patient to monitor blood pressure on a daily basis at home and to notify our office if it remains soft, could consider reducing antihypertensive medications.  Patient is otherwise stable from a cardiovascular standpoint.  Follow-up in 6 months, sooner if needed, for paroxysmal atrial fibrillation, hypertension.   Alethia Berthold, PA-C 12/11/2020, 2:22 PM Office: [phone]

## 2020-12-11 ENCOUNTER — Encounter: Payer: Self-pay | Admitting: Student

## 2020-12-11 ENCOUNTER — Other Ambulatory Visit: Payer: Self-pay

## 2020-12-11 ENCOUNTER — Ambulatory Visit: Payer: No Typology Code available for payment source | Admitting: Student

## 2020-12-11 VITALS — BP 110/72 | HR 69 | Temp 97.0°F | Ht 69.0 in | Wt 281.0 lb

## 2020-12-11 DIAGNOSIS — I10 Essential (primary) hypertension: Secondary | ICD-10-CM

## 2020-12-11 DIAGNOSIS — I480 Paroxysmal atrial fibrillation: Secondary | ICD-10-CM

## 2020-12-11 DIAGNOSIS — Z6841 Body Mass Index (BMI) 40.0 and over, adult: Secondary | ICD-10-CM

## 2021-02-08 ENCOUNTER — Other Ambulatory Visit: Payer: Self-pay

## 2021-02-08 ENCOUNTER — Emergency Department (HOSPITAL_COMMUNITY)
Admission: EM | Admit: 2021-02-08 | Discharge: 2021-02-08 | Disposition: A | Discharge status: Home / Self Care | Payer: No Typology Code available for payment source | Attending: Emergency Medicine | Admitting: Emergency Medicine

## 2021-02-08 DIAGNOSIS — F10129 Alcohol abuse with intoxication, unspecified: Secondary | ICD-10-CM | POA: Insufficient documentation

## 2021-02-08 DIAGNOSIS — Z79899 Other long term (current) drug therapy: Secondary | ICD-10-CM | POA: Insufficient documentation

## 2021-02-08 DIAGNOSIS — I480 Paroxysmal atrial fibrillation: Secondary | ICD-10-CM | POA: Insufficient documentation

## 2021-02-08 DIAGNOSIS — F10920 Alcohol use, unspecified with intoxication, uncomplicated: Secondary | ICD-10-CM

## 2021-02-08 DIAGNOSIS — I10 Essential (primary) hypertension: Secondary | ICD-10-CM | POA: Insufficient documentation

## 2021-02-08 DIAGNOSIS — Z96651 Presence of right artificial knee joint: Secondary | ICD-10-CM | POA: Diagnosis not present

## 2021-02-08 DIAGNOSIS — Z7901 Long term (current) use of anticoagulants: Secondary | ICD-10-CM | POA: Insufficient documentation

## 2021-02-08 NOTE — ED Triage Notes (Signed)
Pt came in with c/o ETOH intox. Unknown amount. Pt endorses drinking wine. Responds to voice

## 2021-02-08 NOTE — ED Notes (Signed)
Pt ambulated in hall at baseline. Pt denies any dizziness or any complaints while walking. Pt asked if she had a ride to get home. Pt states she does not have a ride to get home.

## 2021-02-08 NOTE — ED Provider Notes (Signed)
[facility] DEPT Provider Note   CSN: [id number] Arrival date & time: 02/08/21  2111     History Chief Complaint  Patient presents with   Alcohol Intoxication    [name] is a 55 y.o. female.  Patient to ED from local restaurant by EMS, called after she passed out. She reports drinking a significant amount of wine after being at a weight loss camp over the last month with resulting 14 pound weight loss. She had not eaten a significant amount today and feels this contributed to her intoxication. No fall or injury reported. Patient denies pain.   The history is provided by the patient. No language interpreter was used.  Alcohol Intoxication Pertinent negatives include no chest pain, no abdominal pain, no headaches and no shortness of breath.      Past Medical History:  Diagnosis Date   Adenomyosis 7/07   Anemia    Anxiety    Arthritis    Atrial fibrillation ([place])    Compartment syndrome of lower extremity ([place]) 08/13/2014   Depression    Dysrhythmia    GERD (gastroesophageal reflux disease)    H/O blood clots    superficial blood clots with OCPs   Hypertension    Insomnia    Lower extremity edema    Sinus congestion    saw ENT, had nasal surgery to clean out nasal area for bacteria, mold   Sleep apnea    Uterine fibroid    Vitamin D deficiency     Patient Active Problem List   Diagnosis Date Noted   Vasomotor symptoms due to menopause 10/31/2019   History of Roux-en-Y gastric bypass, 2018 05/03/2017   Sleep apnea 07/14/2016   Primary osteoarthritis of right knee 06/26/2016   Paroxysmal atrial fibrillation ([place]), followed by Dr. Einar Gip    Nodule of right lung 05/05/2012   Lap band APS, 02/18/2009 11/13/2011   Environmental allergies 11/11/2011   Anxiety 09/03/2011   Essential hypertension 11/11/2009   Emotional eating 10/11/2008   Gastro-esophageal reflux disease with esophagitis 10/11/2008    Past Surgical History:   Procedure Laterality Date   ABDOMINAL HYSTERECTOMY     BREAST BIOPSY     x2   CESAREAN SECTION     ESOPHAGOGASTRODUODENOSCOPY N/A 06/15/2016   Procedure: ESOPHAGOGASTRODUODENOSCOPY (EGD);  Surgeon: Alphonsa Overall, MD;  Location: [facility];  Service: General;  Laterality: N/A;   ESOPHAGOGASTRODUODENOSCOPY (EGD) WITH PROPOFOL N/A 09/20/2018   Procedure: ESOPHAGOGASTRODUODENOSCOPY (EGD) WITH PROPOFOL;  Surgeon: Carol Ada, MD;  Location: [place];  Service: Endoscopy;  Laterality: N/A;   LAPAROSCOPIC GASTRIC BANDING  2010   LAPAROSCOPIC GASTRIC BANDING  2011   LAPAROSCOPY     x2   LAPAROSCOPY N/A 09/21/2018   Procedure: LAPAROSCOPY DIAGNOSTIC;  Surgeon: Greer Pickerel, MD;  Location: [place];  Service: General;  Laterality: N/A;   MOUTH SURGERY     NASAL SINUS SURGERY  4/13   PARTIAL KNEE ARTHROPLASTY Right 06/26/2016   Procedure: RIGHT UNICOMPARTMENTAL KNEE CODYLE AND PLATEAU MEDIAL COMPARTMENT;  Surgeon: Renette Butters, MD;  Location: [place];  Service: Orthopedics;  Laterality: Right;   ROUX-EN-Y GASTRIC BYPASS  2018     OB History     Gravida  2   Para  1   Term      Preterm      AB      Living  1      SAB      IAB  Ectopic      Multiple      Live Births           Obstetric Comments  One adopted child         Family History  Problem Relation Age of Onset   Cancer Father        prostate and lung   Diabetes Father    Hypertension Father    Alcoholism Father    Diabetes Mother    Hypertension Mother    Hyperlipidemia Mother    Heart disease Mother    Depression Mother    Ovarian cancer Paternal Grandmother    Hypertension Brother        x2   Hypothyroidism Brother    Hypothyroidism Maternal Grandmother     Social History   Tobacco Use   Smoking status: Never   Smokeless tobacco: Never  Vaping Use   Vaping Use: Never used  Substance Use Topics   Alcohol use: Yes    Alcohol/week: 3.0 - 4.0 standard drinks     Types: 3 - 4 Standard drinks or equivalent per week   Drug use: No    Home Medications Prior to Admission medications   Medication Sig Start Date End Date Taking? Authorizing Provider  ALPRAZolam Duanne Moron) 0.5 MG tablet SMARTSIG:1-2 Tablet(s) By Mouth 1 to 2 Times Daily PRN 08/01/19   [provider]  apixaban (ELIQUIS) 5 MG TABS tablet Take 1 tablet (5 mg total) by mouth 2 (two) times daily. 05/22/20   Adrian Prows, MD  BELSOMRA 10 MG TABS Take 10 mg by mouth at bedtime and may repeat dose one time if needed. 10/03/20   [provider]  diltiazem (CARDIZEM CD) 180 MG 24 hr capsule  11/01/19   [provider]  doxycycline (DORYX) 100 MG EC tablet Take 100 mg by mouth as needed.    [provider]  erythromycin ophthalmic ointment 1 application at bedtime.    [provider]  escitalopram (LEXAPRO) 20 MG tablet Take 1.5 tablets (30 mg total) by mouth daily. 03/01/20 05/22/20  Megan Salon, MD  flecainide (TAMBOCOR) 150 MG tablet Take 1 tablet (150 mg total) by mouth 2 (two) times daily. 05/22/20   Adrian Prows, MD  hydrocortisone 2.5 % cream Apply BID to affected areas 1 week on 1 week off 10/19/19   [provider]  ketoconazole (NIZORAL) 2 % cream Apply BID to affected areas for 3 weeks 10/19/19   [provider]  levocetirizine (XYZAL) 5 MG tablet Take 5 mg by mouth every evening.    [provider]  Melatonin 10 MG CAPS Take 1 capsule by mouth daily.    [provider]  metoprolol succinate (TOPROL-XL) 50 MG 24 hr tablet Take 1 tablet (50 mg total) by mouth daily. Take with or immediately following a meal. 05/22/20   Adrian Prows, MD  spironolactone (ALDACTONE) 25 MG tablet Take 1 tablet (25 mg total) by mouth daily. 05/22/20   Adrian Prows, MD  Trolamine Salicylate (BLUE-EMU HEMP EX) Take by mouth.    [provider]  valsartan (DIOVAN) 160 MG tablet Take 1 tablet (160 mg total) by mouth daily. 05/22/20   Adrian Prows, MD    Allergies    Flagyl [metronidazole] and Levaquin [levofloxacin]  Review of Systems   Review of Systems  Constitutional:  Negative for chills and fever.  HENT: Negative.    Eyes:  Negative for visual disturbance.  Respiratory: Negative.  Negative for shortness  of breath.   Cardiovascular: Negative.  Negative for chest pain.  Gastrointestinal: Negative.  Negative for abdominal pain, nausea and vomiting.  Musculoskeletal: Negative.   Skin: Negative.   Neurological: Negative.  Negative for headaches.       Altered mental status, syncope  Psychiatric/Behavioral:  Positive for confusion.    Physical Exam Updated Vital Signs BP 102/63   Pulse (!) 52   Temp 97.7 F (36.5 C)   Resp 16   Wt 117.9 kg   LMP 08/10/2005   SpO2 90%   BMI 38.40 kg/m   Physical Exam Vitals and nursing note reviewed.  Constitutional:      General: She is not in acute distress.    Appearance: Normal appearance. She is obese.  HENT:     Head: Normocephalic and atraumatic.     Mouth/Throat:     Mouth: Mucous membranes are dry.  Eyes:     Conjunctiva/sclera: Conjunctivae normal.     Pupils: Pupils are equal, round, and reactive to light.  Cardiovascular:     Rate and Rhythm: Normal rate and regular rhythm.     Heart sounds: No murmur heard. Pulmonary:     Effort: Pulmonary effort is normal.     Breath sounds: No wheezing, rhonchi or rales.  Abdominal:     Palpations: Abdomen is soft.     Tenderness: There is no abdominal tenderness.  Musculoskeletal:        General: Normal range of motion.     Cervical back: Normal range of motion and neck supple.  Skin:    General: Skin is warm and dry.  Neurological:     Mental Status: She is alert and oriented to person, place, and time.     Sensory: No sensory deficit.     Motor: No weakness.     Coordination: Coordination normal.    ED Results / Procedures / Treatments   Labs (all labs ordered are listed, but only abnormal results are  displayed) Labs Reviewed - No data to display  EKG None  Radiology No results found.  Procedures Procedures   Medications Ordered in ED Medications - No data to display  ED Course  I have reviewed the triage vital signs and the nursing notes.  Pertinent labs & imaging results that were available during my care of the patient were reviewed by me and considered in my medical decision making (see chart for details).    MDM Rules/Calculators/A&P                          Patient to ED by EMS, called by [facility] employee. The patient does not remember what happened but feels she drank too much and passed out. She denies pain or injury.   I talked to Yvone Neu at [facility]. He reports the patient is well known to the restaurant as a regular customer. He also reports that she has passed out similar to episode tonight on multiple occasions. She was found on the floor "asleep" and was difficult to awaken tonight so EMS was called. No reported fall.   The patient is awake and alert here. VSS. No evidence head or other injury. Moves all extremities with pain. Ambulatory and steady. I called the patient mother at the patient's request to let her know she was here and what happened. Her mother states she was already aware and that there was a ride waiting at the hospital to drive her home.  Final Clinical Impression(s) / ED Diagnoses Final diagnoses:  None   Alcohol intoxication  Rx / DC Orders ED Discharge Orders     None        Dennie Bible 02/09/21 0453    Valarie Merino, MD 02/10/21 Bosie Helper

## 2021-02-08 NOTE — Discharge Instructions (Addendum)
Follow up with your doctor as needed.   Be aware that significant alcohol intake can act as a blood thinner, and while you are taking Eliquis as well, you are at significant risk of bleeding with any injury while drinking.

## 2021-02-24 NOTE — Telephone Encounter (Signed)
From pt

## 2021-02-24 NOTE — Telephone Encounter (Signed)
Labs on 12/27/2020:  Serum glucose 1 1 mg, serum uric acid 6.1, BUN 13, creatinine 0.94, EGFR >60 mL, potassium 4.1.  Alkaline phosphatase minimally elevated at 123 (44-121).  LDH normal.  Total cholesterol 151, triglycerides 79, HDL 65, LDL 71.  Hb 13.0/HCT 40.4, platelets 318.  A1c 5.5%.  TSH normal.  Vitamin D32.3.  C-reactive protein 1.27.

## 2021-03-12 ENCOUNTER — Telehealth: Payer: Self-pay | Admitting: Cardiology

## 2021-03-12 NOTE — Telephone Encounter (Signed)
Pt was traveling and lost a medication bottle (spironolcatone). Pt wondering if someone can call pharmacy & re-up prescription or put new one through, says she would self-pay if need be (if insurance doesn't cover, pt doesn't return until Nov. for f/u).

## 2021-03-14 ENCOUNTER — Other Ambulatory Visit: Payer: Self-pay

## 2021-03-14 DIAGNOSIS — I10 Essential (primary) hypertension: Secondary | ICD-10-CM

## 2021-03-14 MED ORDER — SPIRONOLACTONE 25 MG PO TABS: 25.0000 mg | ORAL_TABLET | Freq: Every day | ORAL | 0 refills | Status: DC

## 2021-05-20 ENCOUNTER — Other Ambulatory Visit: Payer: Self-pay

## 2021-05-20 ENCOUNTER — Telehealth: Payer: Self-pay

## 2021-05-20 DIAGNOSIS — I480 Paroxysmal atrial fibrillation: Secondary | ICD-10-CM

## 2021-05-20 MED ORDER — APIXABAN 5 MG PO TABS: 5.0000 mg | ORAL_TABLET | Freq: Two times a day (BID) | ORAL | 0 refills | Status: DC

## 2021-05-20 MED ORDER — APIXABAN 5 MG PO TABS: 5.0000 mg | ORAL_TABLET | Freq: Two times a day (BID) | ORAL | 2 refills | Status: DC

## 2021-05-22 ENCOUNTER — Ambulatory Visit: Payer: No Typology Code available for payment source | Admitting: Cardiology

## 2021-05-22 NOTE — Telephone Encounter (Signed)
Error

## 2021-06-02 ENCOUNTER — Other Ambulatory Visit: Payer: Self-pay | Admitting: Cardiology

## 2021-06-02 DIAGNOSIS — I10 Essential (primary) hypertension: Secondary | ICD-10-CM

## 2021-06-12 NOTE — Progress Notes (Signed)
Primary Physician/Referring:  Fanny Bien, MD  [name], female    DOB: 1966/03/22, 55 y.o.   MRN: [id number]  Chief Complaint  Patient presents with   Hypertension   Atrial Fibrillation   Follow-up    6 month   HPI:    [name]  is a 55 y.o.  African-American female with morbid obesity, history of gastric banding in 2011, due to failure of the same, underwent gastric bypass surgery in Sept 2018, paroxysmal atrial fibrillation no known recurrence since initiation of flecainide and presently on Eliquis, hypertension, morbid obesity, OSA on CPAP.  Patient presents for 50-monthfollow-up of paroxysmal atrial fibrillation and hypertension.  At last office visit patient was stable from a cardiovascular standpoint and no changes were made to medications.  Patient has been working with a weight loss program and has lost an additional approximately 10 pounds since May 2022.  Patient remains asymptomatic from a cardiovascular standpoint, however she does report occasional episodes of atrial fibrillation alerts from her apple watch.  Denies chest pain, dyspnea, dizziness, syncope, near syncope.  Patient continues to exercise regularly and focus on weight loss, although she does admit to poor dietary compliance and drinking more alcohol recently.  Patient has also inquired today regarding watchman procedure and whether she would be a candidate as she dislikes the bruising associated with oral anticoagulation.  Denies significant bleeding diathesis.  Denies melena, blood in her urine or stool, recurrent nosebleeds.  Past Medical History:  Diagnosis Date   Adenomyosis 7/07   Anemia    Anxiety    Arthritis    Atrial fibrillation ([facility]    Compartment syndrome of lower extremity ([facility] 08/13/2014   Depression    Dysrhythmia    GERD (gastroesophageal reflux disease)    H/O blood clots    superficial blood clots with OCPs   Hypertension    Insomnia    Lower extremity edema     Sinus congestion    saw ENT, had nasal surgery to clean out nasal area for bacteria, mold   Sleep apnea    Uterine fibroid    Vitamin D deficiency    Past Surgical History:  Procedure Laterality Date   ABDOMINAL HYSTERECTOMY     BREAST BIOPSY     x2   CESAREAN SECTION     ESOPHAGOGASTRODUODENOSCOPY N/A 06/15/2016   Procedure: ESOPHAGOGASTRODUODENOSCOPY (EGD);  Surgeon: DAlphonsa Overall MD;  [facility];  Service: General;  Laterality: N/A;   ESOPHAGOGASTRODUODENOSCOPY (EGD) WITH PROPOFOL N/A 09/20/2018   Procedure: ESOPHAGOGASTRODUODENOSCOPY (EGD) WITH PROPOFOL;  Surgeon: HCarol Ada MD;  [place] [facility]  Service: Endoscopy;  Laterality: N/A;   LAPAROSCOPIC GASTRIC BANDING  2010   LAPAROSCOPIC GASTRIC BANDING  2011   LAPAROSCOPY     x2   LAPAROSCOPY N/A 09/21/2018   Procedure: LAPAROSCOPY DIAGNOSTIC;  Surgeon: WGreer Pickerel MD;  [facility]  Service: General;  Laterality: N/A;   MOUTH SURGERY     NASAL SINUS SURGERY  4/13   PARTIAL KNEE ARTHROPLASTY Right 06/26/2016   Procedure: RIGHT UNICOMPARTMENTAL KNEE CODYLE AND PLATEAU MEDIAL COMPARTMENT;  Surgeon: TRenette Butters MD;  [place] [facility]  Service: Orthopedics;  Laterality: Right;   ROUX-EN-Y GASTRIC BYPASS  2018   Family History  Problem Relation Age of Onset   Cancer Father        prostate and lung   Diabetes Father    Hypertension Father    Alcoholism Father  Diabetes Mother    Hypertension Mother    Hyperlipidemia Mother    Heart disease Mother    Depression Mother    Ovarian cancer Paternal Grandmother    Hypertension Brother        x2   Hypothyroidism Brother    Hypothyroidism Maternal Grandmother    Social History   Tobacco Use   Smoking status: Never   Smokeless tobacco: Never  Substance Use Topics   Alcohol use: Yes    Alcohol/week: 3.0 - 4.0 standard drinks    Types: 3 - 4 Standard drinks or equivalent per week  Marital Status: Divorced  ROS   Review of Systems  Constitutional: Negative for malaise/fatigue and weight loss.  Cardiovascular:  Negative for chest pain, claudication, dyspnea on exertion, leg swelling, near-syncope, orthopnea, palpitations, paroxysmal nocturnal dyspnea and syncope.  Respiratory:  Positive for sleep disturbances due to breathing (sleep apnea). Negative for shortness of breath.   Hematologic/Lymphatic: Does not bruise/bleed easily.  Gastrointestinal:  Negative for melena.  Neurological:  Negative for dizziness and weakness.  Objective   Vitals with BMI 06/13/2021 02/08/2021 02/08/2021  Height 5' 9"  - -  Weight 270 lbs - 260 lbs  BMI 97.98 - -  Systolic 921 194 -  Diastolic 71 96 -  Pulse 69 57 -    Blood pressure 126/71, pulse 69, temperature 98 F (36.7 C), resp. rate 16, height 5' 9"  (1.753 m), weight 270 lb (122.5 kg), last menstrual period 08/10/2005, SpO2 97 %. Body mass index is 39.87 kg/m.   Physical Exam Vitals reviewed.  Constitutional:      General: She is not in acute distress.    Appearance: She is well-developed.     Comments: Morbidly obese  Neck:     Thyroid: No thyromegaly.     Comments: Short neck and difficult to evaluate JVP Cardiovascular:     Rate and Rhythm: Normal rate and regular rhythm.     Pulses: Intact distal pulses.          Carotid pulses are 2+ on the right side and 2+ on the left side.      Dorsalis pedis pulses are 2+ on the right side and 2+ on the left side.       Posterior tibial pulses are 2+ on the right side and 2+ on the left side.     Heart sounds: Normal heart sounds, S1 normal and S2 normal. No murmur heard.   No gallop.     Comments: Femoral and popliteal pulse difficult to feel due to patient's body habitus.  Pulmonary:     Effort: Pulmonary effort is normal. No respiratory distress.     Breath sounds: Normal breath sounds. No wheezing, rhonchi or rales.  Abdominal:     Comments: Pannus present  Musculoskeletal:        General: Normal range of  motion.     Right lower leg: No edema.     Left lower leg: No edema.  Skin:    General: Skin is warm and dry.  Neurological:     Mental Status: She is alert.   Laboratory examination:   No results for input(s): NA, K, CL, CO2, GLUCOSE, BUN, CREATININE, CALCIUM, GFRNONAA, GFRAA in the last 8760 hours. CMP Latest Ref Rng & Units 11/15/2019 10/17/2019 09/20/2018  Glucose 65 - 99 mg/dL 95 100(H) 99  BUN 6 - 24 mg/dL 11 16 <5(L)  Creatinine 0.57 - 1.00 mg/dL 0.87 0.85 0.81  Sodium 134 - 144 mmol/L  140 138 141  Potassium 3.5 - 5.2 mmol/L 4.6 4.8 3.7  Chloride 96 - 106 mmol/L 104 101 105  CO2 20 - 29 mmol/L 26 24 28   Calcium 8.7 - 10.2 mg/dL 9.4 9.2 9.1  Total Protein 6.0 - 8.5 g/dL 6.6 6.9 6.2(L)  Total Bilirubin 0.0 - 1.2 mg/dL 0.3 0.4 0.3  Alkaline Phos 39 - 117 IU/L 133(H) 130(H) 82  AST 0 - 40 IU/L 21 27 16   ALT 0 - 32 IU/L 17 22 12    CBC Latest Ref Rng & Units 10/17/2019 09/20/2018 09/19/2018  WBC 3.4 - 10.8 x10E3/uL 5.8 4.5 4.3  Hemoglobin 11.1 - 15.9 g/dL 12.6 11.5(L) 11.0(L)  Hematocrit 34.0 - 46.6 % 38.7 37.1 35.5(L)  Platelets 150 - 450 x10E3/uL 370 292 269   Lipid Panel  No results found for: CHOL, TRIG, HDL, CHOLHDL, VLDL, LDLCALC, LDLDIRECT HEMOGLOBIN A1C Lab Results  Component Value Date   HGBA1C 5.5 10/17/2019   TSH No results for input(s): TSH in the last 8760 hours. External labs: 12/27/2020: Serum glucose 1 1 mg, serum uric acid 6.1, BUN 13, creatinine 0.94, EGFR >60 mL, potassium 4.1.  Alkaline phosphatase minimally elevated at 123 (44-121).  LDH normal. Total cholesterol 151, triglycerides 79, HDL 65, LDL 71. Hb 13.0/HCT 40.4, platelets 318. A1c 5.5%.  TSH normal.  Vitamin D32.3.  C-reactive protein 1.27.  05/10/2020: Sodium 139, potassium 4.2, BUN 12, creatinine 0.92, EGFR >60 mL Total cholesterol 169, triglycerides 77, HDL 72, LDL 82. Vitamin D 21.9. TSH 0.79, A1c 5.2%. Hb 12.8/HCT 40.8, platelets 358, normal indicis.  01/01/2020: RBC 4.06, hemoglobin  12.2, hematocrit 36.1, CBC otherwise normal  09/01/2019: HDL 92, triglycerides 64, total cholesterol 168, calculated LDL 63.2  Allergies   Allergies  Allergen Reactions   Flagyl [Metronidazole] Anaphylaxis   Levaquin [Levofloxacin] Other (See Comments)    Pains and aches throughout body, pt states cant move well    Medications Prior to Visit:   Outpatient Medications Prior to Visit  Medication Sig Dispense Refill   ALPRAZolam (XANAX) 0.5 MG tablet SMARTSIG:1-2 Tablet(s) By Mouth 1 to 2 Times Daily PRN     BELSOMRA 10 MG TABS Take 10 mg by mouth at bedtime and may repeat dose one time if needed.     doxycycline (DORYX) 100 MG EC tablet Take 100 mg by mouth as needed.     erythromycin ophthalmic ointment 1 application at bedtime.     hydrocortisone 2.5 % cream Apply BID to affected areas 1 week on 1 week off     ketoconazole (NIZORAL) 2 % cream Apply BID to affected areas for 3 weeks     levocetirizine (XYZAL) 5 MG tablet Take 5 mg by mouth every evening.     Melatonin 10 MG CAPS Take 1 capsule by mouth daily.     Semaglutide,0.25 or 0.5MG/DOS, (OZEMPIC, 0.25 OR 0.5 MG/DOSE,) 2 MG/1.5ML SOPN INJECT 0.25 MG UNDER THE SKIN ONCE A WEEK     apixaban (ELIQUIS) 5 MG TABS tablet Take 1 tablet (5 mg total) by mouth 2 (two) times daily. 180 tablet 2   diltiazem (CARDIZEM CD) 180 MG 24 hr capsule      flecainide (TAMBOCOR) 150 MG tablet Take 1 tablet (150 mg total) by mouth 2 (two) times daily. 180 tablet 3   metoprolol succinate (TOPROL-XL) 50 MG 24 hr tablet Take 1 tablet (50 mg total) by mouth daily. Take with or immediately following a meal. 90 tablet 3   spironolactone (ALDACTONE) 25 MG tablet  TAKE 1 TABLET DAILY 90 tablet 3   valsartan (DIOVAN) 160 MG tablet Take 1 tablet (160 mg total) by mouth daily. 90 tablet 3   escitalopram (LEXAPRO) 20 MG tablet Take 1.5 tablets (30 mg total) by mouth daily. 45 tablet 12   Trolamine Salicylate (BLUE-EMU HEMP EX) Take by mouth.     No  facility-administered medications prior to visit.   Final Medications at End of Visit    Current Meds  Medication Sig   ALPRAZolam (XANAX) 0.5 MG tablet SMARTSIG:1-2 Tablet(s) By Mouth 1 to 2 Times Daily PRN   BELSOMRA 10 MG TABS Take 10 mg by mouth at bedtime and may repeat dose one time if needed.   doxycycline (DORYX) 100 MG EC tablet Take 100 mg by mouth as needed.   erythromycin ophthalmic ointment 1 application at bedtime.   hydrocortisone 2.5 % cream Apply BID to affected areas 1 week on 1 week off   ketoconazole (NIZORAL) 2 % cream Apply BID to affected areas for 3 weeks   levocetirizine (XYZAL) 5 MG tablet Take 5 mg by mouth every evening.   Melatonin 10 MG CAPS Take 1 capsule by mouth daily.   Semaglutide,0.25 or 0.5MG/DOS, (OZEMPIC, 0.25 OR 0.5 MG/DOSE,) 2 MG/1.5ML SOPN INJECT 0.25 MG UNDER THE SKIN ONCE A WEEK   [DISCONTINUED] apixaban (ELIQUIS) 5 MG TABS tablet Take 1 tablet (5 mg total) by mouth 2 (two) times daily.   [DISCONTINUED] diltiazem (CARDIZEM CD) 180 MG 24 hr capsule    [DISCONTINUED] flecainide (TAMBOCOR) 150 MG tablet Take 1 tablet (150 mg total) by mouth 2 (two) times daily.   [DISCONTINUED] metoprolol succinate (TOPROL-XL) 50 MG 24 hr tablet Take 1 tablet (50 mg total) by mouth daily. Take with or immediately following a meal.   [DISCONTINUED] spironolactone (ALDACTONE) 25 MG tablet TAKE 1 TABLET DAILY   [DISCONTINUED] valsartan (DIOVAN) 160 MG tablet Take 1 tablet (160 mg total) by mouth daily.   Radiology:  No results found.  Cardiac Studies:   Lexiscan Sestamibi stress test 05/11/2014: 1. The resting electrocardiogram demonstrated normal sinus rhythm and normal resting conduction.  The stress electrocardiogram was non-diagnostic due to low heart rate.  pharmacologic stress test. 2. The perfusion imaging study demonstrates the left ventricle to be mildly dilated with a left ventricular end-diastolic volume of 481 mL.  Otherwise there is no evidence of  ischemia, normal wall motion, LVEF 55%.  This represents a low risk study.  Echocardiogram 05/09/2014: Left ventricle cavity is normal in size. Mild concentric hypertrophy of the left ventricle. Normal global wall motion. Normal diastolic filling pattern. Calculated EF 64%. Insignificant pericardial effusion with clear fluids.  Sleep study 06/13/2014: Moderate obstructive sleep apnea, successfully treated with CPAP. Dr. Rexene Alberts   EKG  06/13/2021: Sinus rhythm at a rate of 63 bpm.  Normal axis.  Biatrial enlargement. Normal axis. PRWP, cannot exclude anteroseptal infarct old.  EKG 12/11/2020: Sinus rhythm at a rate of 68 bpm.  Biatrial enlargement.  Normal axis.  Nonspecific T wave abnormality.  Normal QT interval.  No evidence of ischemia or underlying injury pattern.  Compared to EKG 05/22/2020, no significant change.   EKG 05/23/2019: Normal sinus rhythm at rate of 57 bpm, biatrial abnormality, normal axis.  Poor R-wave progression, probably normal variant.  Normal QT interval.  No evidence of ischemia. No significant change from   EKG 06/03/2018, previously LAE only.    EKG 05/03/2014: Atypical Atrial flutter with variable ventricular response, 110 bpm.  Normal axis, borderline low  voltage complexes, nonspecific T abnormality.   Assessment     ICD-10-CM   1. Paroxysmal atrial fibrillation (HCC)  I48.0 EKG 12-Lead    metoprolol succinate (TOPROL-XL) 50 MG 24 hr tablet    flecainide (TAMBOCOR) 150 MG tablet    apixaban (ELIQUIS) 5 MG TABS tablet    2. Essential hypertension  I10 valsartan (DIOVAN) 160 MG tablet    spironolactone (ALDACTONE) 25 MG tablet     Meds ordered this encounter  Medications   valsartan (DIOVAN) 160 MG tablet    Sig: Take 1 tablet (160 mg total) by mouth daily.    Dispense:  90 tablet    Refill:  3   spironolactone (ALDACTONE) 25 MG tablet    Sig: Take 1 tablet (25 mg total) by mouth daily.    Dispense:  90 tablet    Refill:  3   metoprolol succinate (TOPROL-XL)  50 MG 24 hr tablet    Sig: Take 1 tablet (50 mg total) by mouth daily. Take with or immediately following a meal.    Dispense:  90 tablet    Refill:  3   flecainide (TAMBOCOR) 150 MG tablet    Sig: Take 1 tablet (150 mg total) by mouth 2 (two) times daily.    Dispense:  180 tablet    Refill:  3   diltiazem (CARDIZEM CD) 180 MG 24 hr capsule    Sig: Take 1 capsule (180 mg total) by mouth daily.    Dispense:  90 capsule    Refill:  3   apixaban (ELIQUIS) 5 MG TABS tablet    Sig: Take 1 tablet (5 mg total) by mouth 2 (two) times daily.    Dispense:  180 tablet    Refill:  3   Medications Discontinued During This Encounter  Medication Reason   Trolamine Salicylate (BLUE-EMU HEMP EX) Error   diltiazem (CARDIZEM CD) 180 MG 24 hr capsule Reorder   flecainide (TAMBOCOR) 150 MG tablet Reorder   metoprolol succinate (TOPROL-XL) 50 MG 24 hr tablet Reorder   valsartan (DIOVAN) 160 MG tablet Reorder   apixaban (ELIQUIS) 5 MG TABS tablet Reorder   spironolactone (ALDACTONE) 25 MG tablet Reorder    This patients CHA2DS2-VASc Score 2 (HTN, F) and yearly risk of stroke 2.2%.   Recommendations:   [name]  is a 55 y.o. African-American female with morbid obesity, history of gastric banding in 2011, due to failure of the same, underwent gastric bypass surgery in Sept 2018, paroxysmal atrial fibrillation, hypertension, morbid obesity, OSA on CPAP.  Patient presents for 42-monthfollow-up of paroxysmal atrial fibrillation and hypertension.  At last office visit patient was stable from a cardiovascular standpoint and no changes were made to medications.  Patient has been working with a weight loss program and has lost an additional 10 pounds since May 2022.  I personally reviewed external labs, lipids are well controlled.  Patient is presently in sinus rhythm.  Although she continues to have occasional episodes when her Apple Watch alerted her to atrial fibrillation she is asymptomatic, therefore  will not make changes to medications at this time.  Given CHA2DS2-VASc score of 2 we will continue Eliquis as well asMetoprolol, diltiazem, and flecainide.  Blood pressure is well controlled.  We will continue valsartan and spironolactone as well.  In regard to patient's question of watchman device, given that she is tolerating anticoagulation without significant bleeding diathesis do not feel watchman procedure is indicated at this time, discussed at length with  patient regarding this and she verbalized understanding.  Patient is otherwise stable from a cardiovascular standpoint.  Counseled patient regarding importance of diet and lifestyle modifications including weight loss.  She is congratulated on her efforts thus far and encouraged to continue to lose weight.  Follow-up in 6 months, sooner if needed, for atrial fibrillation and hypertension.   Alethia Berthold, PA-C 06/13/2021, 1:39 PM Office: [phone]

## 2021-06-13 ENCOUNTER — Other Ambulatory Visit: Payer: Self-pay

## 2021-06-13 ENCOUNTER — Ambulatory Visit: Payer: No Typology Code available for payment source | Admitting: Student

## 2021-06-13 ENCOUNTER — Encounter: Payer: Self-pay | Admitting: Student

## 2021-06-13 VITALS — BP 126/71 | HR 69 | Temp 98.0°F | Resp 16 | Ht 69.0 in | Wt 270.0 lb

## 2021-06-13 DIAGNOSIS — I480 Paroxysmal atrial fibrillation: Secondary | ICD-10-CM

## 2021-06-13 DIAGNOSIS — I10 Essential (primary) hypertension: Secondary | ICD-10-CM

## 2021-06-13 MED ORDER — APIXABAN 5 MG PO TABS: 5.0000 mg | ORAL_TABLET | Freq: Two times a day (BID) | ORAL | 3 refills | Status: DC

## 2021-06-13 MED ORDER — METOPROLOL SUCCINATE ER 50 MG PO TB24: 50.0000 mg | ORAL_TABLET | Freq: Every day | ORAL | 3 refills | Status: DC

## 2021-06-13 MED ORDER — FLECAINIDE ACETATE 150 MG PO TABS: 150.0000 mg | ORAL_TABLET | Freq: Two times a day (BID) | ORAL | 3 refills | Status: DC

## 2021-06-13 MED ORDER — DILTIAZEM HCL ER COATED BEADS 180 MG PO CP24: 180.0000 mg | ORAL_CAPSULE | Freq: Every day | ORAL | 3 refills | Status: DC

## 2021-06-13 MED ORDER — SPIRONOLACTONE 25 MG PO TABS: 25.0000 mg | ORAL_TABLET | Freq: Every day | ORAL | 3 refills | Status: DC

## 2021-06-13 MED ORDER — VALSARTAN 160 MG PO TABS: 160.0000 mg | ORAL_TABLET | Freq: Every day | ORAL | 3 refills | Status: DC

## 2021-12-29 NOTE — Progress Notes (Unsigned)
Primary Physician/Referring:  Fanny Bien, MD  [name], female    DOB: 11-Feb-1966, 56 y.o.   MRN: [id number]  No chief complaint on file.  HPI:    [name]  is a 56 y.o.  African-American female with morbid obesity, history of gastric banding in 2011, due to failure of the same, underwent gastric bypass surgery in Sept 2018, paroxysmal atrial fibrillation no known recurrence since initiation of flecainide and presently on Eliquis, hypertension, morbid obesity, OSA on CPAP.  Patient presents for 56-monthfollow-up.  Last office visit patient was stable from a cardiovascular standpoint and no changes were made. ***  ***Recent labs?   Patient presents for 568-monthollow-up of paroxysmal atrial fibrillation and hypertension.  At last office visit patient was stable from a cardiovascular standpoint and no changes were made to medications.  Patient has been working with a weight loss program and has lost an additional approximately 10 pounds since May 2022.  Patient remains asymptomatic from a cardiovascular standpoint, however she does report occasional episodes of atrial fibrillation alerts from her apple watch.  Denies chest pain, dyspnea, dizziness, syncope, near syncope.  Patient continues to exercise regularly and focus on weight loss, although she does admit to poor dietary compliance and drinking more alcohol recently.  Patient has also inquired today regarding watchman procedure and whether she would be a candidate as she dislikes the bruising associated with oral anticoagulation.  Denies significant bleeding diathesis.  Denies melena, blood in her urine or stool, recurrent nosebleeds.  Past Medical History:  Diagnosis Date   Adenomyosis 7/07   Anemia    Anxiety    Arthritis    Atrial fibrillation ([place]   Compartment syndrome of lower extremity ([place]1/11/2014   Depression    Dysrhythmia    GERD (gastroesophageal reflux disease)    H/O blood clots     superficial blood clots with OCPs   Hypertension    Insomnia    Lower extremity edema    Sinus congestion    saw ENT, had nasal surgery to clean out nasal area for bacteria, mold   Sleep apnea    Uterine fibroid    Vitamin D deficiency    Past Surgical History:  Procedure Laterality Date   ABDOMINAL HYSTERECTOMY     BREAST BIOPSY     x2   CESAREAN SECTION     ESOPHAGOGASTRODUODENOSCOPY N/A 06/15/2016   Procedure: ESOPHAGOGASTRODUODENOSCOPY (EGD);  Surgeon: DaAlphonsa OverallMD;  [facility];  Service: General;  Laterality: N/A;   ESOPHAGOGASTRODUODENOSCOPY (EGD) WITH PROPOFOL N/A 09/20/2018   Procedure: ESOPHAGOGASTRODUODENOSCOPY (EGD) WITH PROPOFOL;  Surgeon: HuCarol AdaMD;  [facility]: [facility]oscopy;  Laterality: N/A;   LAPAROSCOPIC GASTRIC BANDING  2010   LAPAROSCOPIC GASTRIC BANDING  2011   LAPAROSCOPY     x2   LAPAROSCOPY N/A 09/21/2018   Procedure: LAPAROSCOPY DIAGNOSTIC;  Surgeon: WiGreer PickerelMD;  [facility]: General;  Laterality: N/A;   MOUTH SURGERY     NASAL SINUS SURGERY  4/13   PARTIAL KNEE ARTHROPLASTY Right 06/26/2016   Procedure: RIGHT UNICOMPARTMENTAL KNEE CODYLE AND PLATEAU MEDIAL COMPARTMENT;  Surgeon: TiRenette ButtersMD;  [facility];  Laterality: Right;   ROUX-EN-Y GASTRIC BYPASS  2018   Family History  Problem Relation Age of Onset   Cancer Father        prostate and lung   Diabetes Father  Hypertension Father    Alcoholism Father    Diabetes Mother    Hypertension Mother    Hyperlipidemia Mother    Heart disease Mother    Depression Mother    Ovarian cancer Paternal Grandmother    Hypertension Brother        x2   Hypothyroidism Brother    Hypothyroidism Maternal Grandmother    Social History   Tobacco Use   Smoking status: Never   Smokeless tobacco: Never  Substance Use Topics   Alcohol use: Yes    Alcohol/week: 3.0 - 4.0 standard drinks     Types: 3 - 4 Standard drinks or equivalent per week  Marital Status: Divorced  ROS  Review of Systems  Constitutional: Negative for malaise/fatigue and weight loss.  Cardiovascular:  Negative for chest pain, claudication, dyspnea on exertion, leg swelling, near-syncope, orthopnea, palpitations, paroxysmal nocturnal dyspnea and syncope.  Respiratory:  Positive for sleep disturbances due to breathing (sleep apnea). Negative for shortness of breath.   Hematologic/Lymphatic: Does not bruise/bleed easily.  Gastrointestinal:  Negative for melena.  Neurological:  Negative for dizziness and weakness.   Objective      06/13/2021    8:29 AM 02/08/2021   10:30 PM 02/08/2021    9:29 PM  Vitals with BMI  Height _0     Weight 270 lbs  260 lbs  BMI 73.42    Systolic 876 811   Diastolic 71 96   Pulse 69 57     Last menstrual period 08/10/2005. There is no height or weight on file to calculate BMI.   Physical Exam Vitals reviewed.  Constitutional:      General: She is not in acute distress.    Appearance: She is well-developed.     Comments: Morbidly obese  Neck:     Thyroid: No thyromegaly.     Comments: Short neck and difficult to evaluate JVP Cardiovascular:     Rate and Rhythm: Normal rate and regular rhythm.     Pulses: Intact distal pulses.          Carotid pulses are 2+ on the right side and 2+ on the left side.      Dorsalis pedis pulses are 2+ on the right side and 2+ on the left side.       Posterior tibial pulses are 2+ on the right side and 2+ on the left side.     Heart sounds: Normal heart sounds, S1 normal and S2 normal. No murmur heard.   No gallop.     Comments: Femoral and popliteal pulse difficult to feel due to patient's body habitus.  Pulmonary:     Effort: Pulmonary effort is normal. No respiratory distress.     Breath sounds: Normal breath sounds. No wheezing, rhonchi or rales.  Abdominal:     Comments: Pannus present  Musculoskeletal:        General: Normal  range of motion.     Right lower leg: No edema.     Left lower leg: No edema.  Skin:    General: Skin is warm and dry.  Neurological:     Mental Status: She is alert.   Laboratory examination:   No results for input(s): NA, K, CL, CO2, GLUCOSE, BUN, CREATININE, CALCIUM, GFRNONAA, GFRAA in the last 8760 hours.    Latest Ref Rng & Units 11/15/2019    1:04 PM 10/17/2019   12:16 PM 09/20/2018    4:41 AM  CMP  Glucose 65 - 99 mg/dL 95  100   99    BUN 6 - 24 mg/dL 11   16   <5    Creatinine 0.57 - 1.00 mg/dL 0.87   0.85   0.81    Sodium 134 - 144 mmol/L 140   138   141    Potassium 3.5 - 5.2 mmol/L 4.6   4.8   3.7    Chloride 96 - 106 mmol/L 104   101   105    CO2 20 - 29 mmol/L _0 Calcium 8.7 - 10.2 mg/dL 9.4   9.2   9.1    Total Protein 6.0 - 8.5 g/dL 6.6   6.9   6.2    Total Bilirubin 0.0 - 1.2 mg/dL 0.3   0.4   0.3    Alkaline Phos 39 - 117 IU/L 133   130   82    AST 0 - 40 IU/L _1 ALT 0 - 32 IU/L _2 Latest Ref Rng & Units 10/17/2019   12:16 PM 09/20/2018    4:41 AM 09/19/2018    4:43 AM  CBC  WBC 3.4 - 10.8 x10E3/uL 5.8   4.5   4.3    Hemoglobin 11.1 - 15.9 g/dL 12.6   11.5   11.0    Hematocrit 34.0 - 46.6 % 38.7   37.1   35.5    Platelets 150 - 450 x10E3/uL 370   292   269     Lipid Panel  No results found for: CHOL, TRIG, HDL, CHOLHDL, VLDL, LDLCALC, LDLDIRECT HEMOGLOBIN A1C Lab Results  Component Value Date   HGBA1C 5.5 10/17/2019   TSH No results for input(s): TSH in the last 8760 hours. External labs: 12/27/2020: Serum glucose 1 1 mg, serum uric acid 6.1, BUN 13, creatinine 0.94, EGFR >60 mL, potassium 4.1.  Alkaline phosphatase minimally elevated at 123 (44-121).  LDH normal. Total cholesterol 151, triglycerides 79, HDL 65, LDL 71. Hb 13.0/HCT 40.4, platelets 318. A1c 5.5%.  TSH normal.  Vitamin D32.3.  C-reactive protein 1.27.  05/10/2020: Sodium 139, potassium 4.2, BUN 12, creatinine 0.92, EGFR >60 mL Total  cholesterol 169, triglycerides 77, HDL 72, LDL 82. Vitamin D 21.9. TSH 0.79, A1c 5.2%. Hb 12.8/HCT 40.8, platelets 358, normal indicis.  01/01/2020: RBC 4.06, hemoglobin 12.2, hematocrit 36.1, CBC otherwise normal  09/01/2019: HDL 92, triglycerides 64, total cholesterol 168, calculated LDL 63.2  Allergies   Allergies  Allergen Reactions   Flagyl [Metronidazole] Anaphylaxis   Levaquin [Levofloxacin] Other (See Comments)    Pains and aches throughout body, pt states cant move well    Medications Prior to Visit:   Outpatient Medications Prior to Visit  Medication Sig Dispense Refill   ALPRAZolam (XANAX) 0.5 MG tablet SMARTSIG:1-2 Tablet(s) By Mouth 1 to 2 Times Daily PRN     apixaban (ELIQUIS) 5 MG TABS tablet Take 1 tablet (5 mg total) by mouth 2 (two) times daily. 180 tablet 3   BELSOMRA 10 MG TABS Take 10 mg by mouth at bedtime and may repeat dose one time if needed.     diltiazem (CARDIZEM CD) 180 MG 24 hr capsule Take 1 capsule (180 mg total) by mouth daily. 90 capsule 3   doxycycline (DORYX) 100 MG EC tablet Take 100 mg by mouth as needed.     erythromycin ophthalmic ointment 1  application at bedtime.     escitalopram (LEXAPRO) 20 MG tablet Take 1.5 tablets (30 mg total) by mouth daily. 45 tablet 12   flecainide (TAMBOCOR) 150 MG tablet Take 1 tablet (150 mg total) by mouth 2 (two) times daily. 180 tablet 3   hydrocortisone 2.5 % cream Apply BID to affected areas 1 week on 1 week off     ketoconazole (NIZORAL) 2 % cream Apply BID to affected areas for 3 weeks     levocetirizine (XYZAL) 5 MG tablet Take 5 mg by mouth every evening.     Melatonin 10 MG CAPS Take 1 capsule by mouth daily.     metoprolol succinate (TOPROL-XL) 50 MG 24 hr tablet Take 1 tablet (50 mg total) by mouth daily. Take with or immediately following a meal. 90 tablet 3   Semaglutide,0.25 or 0.5MG/DOS, (OZEMPIC, 0.25 OR 0.5 MG/DOSE,) 2 MG/1.5ML SOPN INJECT 0.25 MG UNDER THE SKIN ONCE A WEEK     spironolactone  (ALDACTONE) 25 MG tablet Take 1 tablet (25 mg total) by mouth daily. 90 tablet 3   valsartan (DIOVAN) 160 MG tablet Take 1 tablet (160 mg total) by mouth daily. 90 tablet 3   No facility-administered medications prior to visit.   Final Medications at End of Visit    No outpatient medications have been marked as taking for the 12/31/21 encounter (Appointment) with Rayetta Pigg, Breccan Galant C, PA-C.   Radiology:  No results found.  Cardiac Studies:   Lexiscan Sestamibi stress test 05/11/2014: 1. The resting electrocardiogram demonstrated normal sinus rhythm and normal resting conduction.  The stress electrocardiogram was non-diagnostic due to low heart rate.  pharmacologic stress test. 2. The perfusion imaging study demonstrates the left ventricle to be mildly dilated with a left ventricular end-diastolic volume of 384 mL.  Otherwise there is no evidence of ischemia, normal wall motion, LVEF 55%.  This represents a low risk study.  Echocardiogram 05/09/2014: Left ventricle cavity is normal in size. Mild concentric hypertrophy of the left ventricle. Normal global wall motion. Normal diastolic filling pattern. Calculated EF 64%. Insignificant pericardial effusion with clear fluids.  Sleep study 06/13/2014: Moderate obstructive sleep apnea, successfully treated with CPAP. Dr. Rexene Alberts   EKG  ***   06/13/2021: Sinus rhythm at a rate of 63 bpm.  Normal axis.  Biatrial enlargement. Normal axis. PRWP, cannot exclude anteroseptal infarct old.  EKG 12/11/2020: Sinus rhythm at a rate of 68 bpm.  Biatrial enlargement.  Normal axis.  Nonspecific T wave abnormality.  Normal QT interval.  No evidence of ischemia or underlying injury pattern.  Compared to EKG 05/22/2020, no significant change.   EKG 05/23/2019: Normal sinus rhythm at rate of 57 bpm, biatrial abnormality, normal axis.  Poor R-wave progression, probably normal variant.  Normal QT interval.  No evidence of ischemia. No significant change from   EKG  06/03/2018, previously LAE only.    EKG 05/03/2014: Atypical Atrial flutter with variable ventricular response, 110 bpm.  Normal axis, borderline low voltage complexes, nonspecific T abnormality.   Assessment   No diagnosis found.  No orders of the defined types were placed in this encounter.  There are no discontinued medications.   This patients CHA2DS2-VASc Score 2 (HTN, F) and yearly risk of stroke 2.2%.   Recommendations:   [name]  is a 56 y.o. African-American female with morbid obesity, history of gastric banding in 2011, due to failure of the same, underwent gastric bypass surgery in Sept 2018, paroxysmal atrial fibrillation, hypertension, morbid obesity,  OSA on CPAP.  Patient presents for 63-monthfollow-up.  Last office visit patient was stable from a cardiovascular standpoint and no changes were made. ***  ***Recent labs?   Patient presents for 679-monthollow-up of paroxysmal atrial fibrillation and hypertension.  At last office visit patient was stable from a cardiovascular standpoint and no changes were made to medications.  Patient has been working with a weight loss program and has lost an additional 10 pounds since May 2022.  I personally reviewed external labs, lipids are well controlled.  Patient is presently in sinus rhythm.  Although she continues to have occasional episodes when her Apple Watch alerted her to atrial fibrillation she is asymptomatic, therefore will not make changes to medications at this time.  Given CHA2DS2-VASc score of 2 we will continue Eliquis as well asMetoprolol, diltiazem, and flecainide.  Blood pressure is well controlled.  We will continue valsartan and spironolactone as well.  In regard to patient's question of watchman device, given that she is tolerating anticoagulation without significant bleeding diathesis do not feel watchman procedure is indicated at this time, discussed at length with patient regarding this and she verbalized  understanding.  Patient is otherwise stable from a cardiovascular standpoint.  Counseled patient regarding importance of diet and lifestyle modifications including weight loss.  She is congratulated on her efforts thus far and encouraged to continue to lose weight.  Follow-up in 6 months, sooner if needed, for atrial fibrillation and hypertension.   CeAlethia BertholdPA-C 12/29/2021, 8:56 AM Office: [phone]

## 2021-12-31 ENCOUNTER — Ambulatory Visit: Payer: No Typology Code available for payment source | Admitting: Student

## 2022-01-19 ENCOUNTER — Ambulatory Visit: Payer: No Typology Code available for payment source | Admitting: Student

## 2022-01-20 ENCOUNTER — Other Ambulatory Visit: Payer: Self-pay | Admitting: Family Medicine

## 2022-01-20 DIAGNOSIS — Z1231 Encounter for screening mammogram for malignant neoplasm of breast: Secondary | ICD-10-CM

## 2022-01-21 ENCOUNTER — Ambulatory Visit: Payer: No Typology Code available for payment source | Admitting: Student

## 2022-01-21 ENCOUNTER — Encounter: Payer: Self-pay | Admitting: Student

## 2022-01-21 ENCOUNTER — Ambulatory Visit
Admission: RE | Admit: 2022-01-21 | Discharge: 2022-01-21 | Disposition: A | Discharge status: Home / Self Care | Payer: No Typology Code available for payment source | Source: Ambulatory Visit | Attending: Family Medicine | Admitting: Family Medicine

## 2022-01-21 VITALS — BP 122/74 | HR 73 | Temp 98.7°F | Resp 17 | Ht 69.0 in | Wt 249.0 lb

## 2022-01-21 DIAGNOSIS — I480 Paroxysmal atrial fibrillation: Secondary | ICD-10-CM

## 2022-01-21 DIAGNOSIS — Z1231 Encounter for screening mammogram for malignant neoplasm of breast: Secondary | ICD-10-CM

## 2022-01-21 DIAGNOSIS — I10 Essential (primary) hypertension: Secondary | ICD-10-CM

## 2022-01-21 MED ORDER — SPIRONOLACTONE 25 MG PO TABS: 25.0000 mg | ORAL_TABLET | Freq: Every day | ORAL | 3 refills | Status: DC

## 2022-01-21 MED ORDER — APIXABAN 5 MG PO TABS: 5.0000 mg | ORAL_TABLET | Freq: Two times a day (BID) | ORAL | 3 refills | Status: DC

## 2022-01-21 MED ORDER — VALSARTAN 160 MG PO TABS: 160.0000 mg | ORAL_TABLET | Freq: Every day | ORAL | 3 refills | Status: DC

## 2022-01-21 MED ORDER — DILTIAZEM HCL ER COATED BEADS 180 MG PO CP24: 180.0000 mg | ORAL_CAPSULE | Freq: Every day | ORAL | 3 refills | Status: DC

## 2022-01-21 MED ORDER — FLECAINIDE ACETATE 150 MG PO TABS: 150.0000 mg | ORAL_TABLET | Freq: Two times a day (BID) | ORAL | 3 refills | Status: DC

## 2022-01-21 MED ORDER — METOPROLOL SUCCINATE ER 50 MG PO TB24: 50.0000 mg | ORAL_TABLET | Freq: Every day | ORAL | 3 refills | Status: DC

## 2022-01-21 NOTE — Progress Notes (Signed)
Primary Physician/Referring:  Fanny Bien, MD  [name], female    DOB: [birth year], 56 y.o.   MRN: [id number]  Chief Complaint  Patient presents with   Follow-up    6 month   Atrial Fibrillation   Hypertension   HPI:    [name]  is a 56 y.o.  African-American female with morbid obesity, history of gastric banding in 2011, due to failure of the same, underwent gastric bypass surgery in Sept 2018, paroxysmal atrial fibrillation no known recurrence since initiation of flecainide and presently on Eliquis, hypertension, morbid obesity, OSA on CPAP.  Patient presents for 56-monthfollow-up.  Last office visit patient was stable from a cardiovascular standpoint and no changes were made.  Patient continues to feel well overall.  Denies chest pain, dyspnea, dizziness, syncope, near syncope.  She has lost approximately 21 pounds since November, presently taking Ozempic.  Patient denies significant bleeding diathesis, tolerating anticoagulation.  She does wear an Apple Watch which has occasionally alerted her to brief episodes of atrial fibrillation, she remains asymptomatic.  Past Medical History:  Diagnosis Date   Adenomyosis 7/07   Anemia    Anxiety    Arthritis    Atrial fibrillation ([facility]    Compartment syndrome of lower extremity ([facility] 08/13/2014   Depression    Dysrhythmia    GERD (gastroesophageal reflux disease)    H/O blood clots    superficial blood clots with OCPs   Hypertension    Insomnia    Lower extremity edema    Sinus congestion    saw ENT, had nasal surgery to clean out nasal area for bacteria, mold   Sleep apnea    Uterine fibroid    Vitamin D deficiency    Past Surgical History:  Procedure Laterality Date   ABDOMINAL HYSTERECTOMY     BREAST BIOPSY     x2   CESAREAN SECTION     ESOPHAGOGASTRODUODENOSCOPY N/A 06/15/2016   Procedure: ESOPHAGOGASTRODUODENOSCOPY (EGD);  Surgeon: DAlphonsa Overall MD;  [facility];  Service:  General;  Laterality: N/A;   ESOPHAGOGASTRODUODENOSCOPY (EGD) WITH PROPOFOL N/A 09/20/2018   Procedure: ESOPHAGOGASTRODUODENOSCOPY (EGD) WITH PROPOFOL;  Surgeon: HCarol Ada MD;  [facility]  Service: Endoscopy;  Laterality: N/A;   LAPAROSCOPIC GASTRIC BANDING  2010   LAPAROSCOPIC GASTRIC BANDING  2011   LAPAROSCOPY     x2   LAPAROSCOPY N/A 09/21/2018   Procedure: LAPAROSCOPY DIAGNOSTIC;  Surgeon: WGreer Pickerel MD;  [facility]  Service: General;  Laterality: N/A;   MOUTH SURGERY     NASAL SINUS SURGERY  4/13   PARTIAL KNEE ARTHROPLASTY Right 06/26/2016   Procedure: RIGHT UNICOMPARTMENTAL KNEE CODYLE AND PLATEAU MEDIAL COMPARTMENT;  Surgeon: TRenette Butters MD;  [facility]  Service: Orthopedics;  Laterality: Right;   ROUX-EN-Y GASTRIC BYPASS  2018   Family History  Problem Relation Age of Onset   Cancer Father        prostate and lung   Diabetes Father    Hypertension Father    Alcoholism Father    Diabetes Mother    Hypertension Mother    Hyperlipidemia Mother    Heart disease Mother    Depression Mother    Ovarian cancer Paternal Grandmother    Hypertension Brother        x2   Hypothyroidism Brother    Hypothyroidism Maternal Grandmother    Social History   Tobacco Use   Smoking status: Never  Smokeless tobacco: Never  Substance Use Topics   Alcohol use: Yes    Alcohol/week: 3.0 - 4.0 standard drinks of alcohol    Types: 3 - 4 Standard drinks or equivalent per week  Marital Status: Divorced  ROS  Review of Systems  Constitutional: Negative for malaise/fatigue and weight loss.  Cardiovascular:  Negative for chest pain, claudication, dyspnea on exertion, leg swelling, near-syncope, orthopnea, palpitations, paroxysmal nocturnal dyspnea and syncope.  Respiratory:  Positive for sleep disturbances due to breathing (sleep apnea). Negative for shortness of breath.   Hematologic/Lymphatic: Does not bruise/bleed easily.   Gastrointestinal:  Negative for melena.  Neurological:  Negative for dizziness and weakness.    Objective      01/21/2022    3:03 PM 06/13/2021    8:29 AM 02/08/2021   10:30 PM  Vitals with BMI  Height _0  _1    Weight 249 lbs 270 lbs   BMI 58.52 77.82   Systolic 423 536 144  Diastolic 74 71 96  Pulse 73 69 57    Blood pressure 122/74, pulse 73, temperature 98.7 F (37.1 C), temperature source Temporal, resp. rate 17, height _2  (1.753 m), weight 249 lb (112.9 kg), last menstrual period 08/10/2005, SpO2 95 %. Body mass index is 36.77 kg/m.   Physical Exam Vitals reviewed.  Constitutional:      Appearance: She is well-developed.     Comments: Morbidly obese  Neck:     Thyroid: No thyromegaly.     Comments: Short neck and difficult to evaluate JVP Cardiovascular:     Rate and Rhythm: Normal rate and regular rhythm.     Pulses: Intact distal pulses.          Carotid pulses are 2+ on the right side and 2+ on the left side.      Dorsalis pedis pulses are 2+ on the right side and 2+ on the left side.       Posterior tibial pulses are 2+ on the right side and 2+ on the left side.     Heart sounds: Normal heart sounds, S1 normal and S2 normal. No murmur heard.    No gallop.     Comments: Femoral and popliteal pulse difficult to feel due to patient's body habitus.  Pulmonary:     Effort: Pulmonary effort is normal. No respiratory distress.     Breath sounds: Normal breath sounds. No wheezing, rhonchi or rales.  Abdominal:     Comments: Pannus present  Musculoskeletal:     Right lower leg: No edema.     Left lower leg: No edema.    Laboratory examination:   No results for input(s): "NA", "K", "CL", "CO2", "GLUCOSE", "BUN", "CREATININE", "CALCIUM", "GFRNONAA", "GFRAA" in the last 8760 hours.    Latest Ref Rng & Units 11/15/2019    1:04 PM 10/17/2019   12:16 PM 09/20/2018    4:41 AM  CMP  Glucose 65 - 99 mg/dL 95  100  99   BUN 6 - 24 mg/dL 11  16  <5   Creatinine 0.57  - 1.00 mg/dL 0.87  0.85  0.81   Sodium 134 - 144 mmol/L 140  138  141   Potassium 3.5 - 5.2 mmol/L 4.6  4.8  3.7   Chloride 96 - 106 mmol/L 104  101  105   CO2 20 - 29 mmol/L _3 Calcium 8.7 - 10.2 mg/dL 9.4  9.2  9.1   Total Protein 6.0 -  8.5 g/dL 6.6  6.9  6.2   Total Bilirubin 0.0 - 1.2 mg/dL 0.3  0.4  0.3   Alkaline Phos 39 - 117 IU/L 133  130  82   AST 0 - 40 IU/L _0 ALT 0 - 32 IU/L _1 Latest Ref Rng & Units 10/17/2019   12:16 PM 09/20/2018    4:41 AM 09/19/2018    4:43 AM  CBC  WBC 3.4 - 10.8 x10E3/uL 5.8  4.5  4.3   Hemoglobin 11.1 - 15.9 g/dL 12.6  11.5  11.0   Hematocrit 34.0 - 46.6 % 38.7  37.1  35.5   Platelets 150 - 450 x10E3/uL 370  292  269    Lipid Panel  No results found for: "CHOL", "TRIG", "HDL", "CHOLHDL", "VLDL", "LDLCALC", "LDLDIRECT" HEMOGLOBIN A1C Lab Results  Component Value Date   HGBA1C 5.5 10/17/2019   TSH No results for input(s): "TSH" in the last 8760 hours. External labs: 12/27/2020: Serum glucose 1 1 mg, serum uric acid 6.1, BUN 13, creatinine 0.94, EGFR >60 mL, potassium 4.1.  Alkaline phosphatase minimally elevated at 123 (44-121).  LDH normal. Total cholesterol 151, triglycerides 79, HDL 65, LDL 71. Hb 13.0/HCT 40.4, platelets 318. A1c 5.5%.  TSH normal.  Vitamin D32.3.  C-reactive protein 1.27.  05/10/2020: Sodium 139, potassium 4.2, BUN 12, creatinine 0.92, EGFR >60 mL Total cholesterol 169, triglycerides 77, HDL 72, LDL 82. Vitamin D 21.9. TSH 0.79, A1c 5.2%. Hb 12.8/HCT 40.8, platelets 358, normal indicis.  01/01/2020: RBC 4.06, hemoglobin 12.2, hematocrit 36.1, CBC otherwise normal  09/01/2019: HDL 92, triglycerides 64, total cholesterol 168, calculated LDL 63.2  Allergies   Allergies  Allergen Reactions   Flagyl [Metronidazole] Anaphylaxis   Levaquin [Levofloxacin] Other (See Comments)    Pains and aches throughout body, pt states cant move well    Medications Prior to Visit:   Outpatient  Medications Prior to Visit  Medication Sig Dispense Refill   ALPRAZolam (XANAX) 0.5 MG tablet SMARTSIG:1-2 Tablet(s) By Mouth 1 to 2 Times Daily PRN     busPIRone (BUSPAR) 5 MG tablet Take 5 mg by mouth 2 (two) times daily.     escitalopram (LEXAPRO) 20 MG tablet Take 1.5 tablets (30 mg total) by mouth daily. 45 tablet 12   Melatonin 10 MG CAPS Take 1 capsule by mouth daily.     montelukast (SINGULAIR) 10 MG tablet Take 10 mg by mouth daily.     Multiple Vitamins-Minerals (MULTIVITAMIN WITH MINERALS) tablet Take 1 tablet by mouth daily.     Semaglutide,0.25 or 0.5MG/DOS, (OZEMPIC, 0.25 OR 0.5 MG/DOSE,) 2 MG/1.5ML SOPN INJECT 0.25 MG UNDER THE SKIN ONCE A WEEK     apixaban (ELIQUIS) 5 MG TABS tablet Take 1 tablet (5 mg total) by mouth 2 (two) times daily. 180 tablet 3   diltiazem (CARDIZEM CD) 180 MG 24 hr capsule Take 1 capsule (180 mg total) by mouth daily. 90 capsule 3   flecainide (TAMBOCOR) 150 MG tablet Take 1 tablet (150 mg total) by mouth 2 (two) times daily. 180 tablet 3   metoprolol succinate (TOPROL-XL) 50 MG 24 hr tablet Take 1 tablet (50 mg total) by mouth daily. Take with or immediately following a meal. 90 tablet 3   spironolactone (ALDACTONE) 25 MG tablet Take 1 tablet (25 mg total) by mouth daily. 90 tablet 3   valsartan (DIOVAN) 160 MG tablet Take 1 tablet (160 mg total)  by mouth daily. 90 tablet 3   BELSOMRA 10 MG TABS Take 10 mg by mouth at bedtime and may repeat dose one time if needed.     doxycycline (DORYX) 100 MG EC tablet Take 100 mg by mouth as needed.     erythromycin ophthalmic ointment 1 application at bedtime.     hydrocortisone 2.5 % cream Apply BID to affected areas 1 week on 1 week off     ketoconazole (NIZORAL) 2 % cream Apply BID to affected areas for 3 weeks     levocetirizine (XYZAL) 5 MG tablet Take 5 mg by mouth every evening.     No facility-administered medications prior to visit.   Final Medications at End of Visit    Current Meds  Medication Sig    ALPRAZolam (XANAX) 0.5 MG tablet SMARTSIG:1-2 Tablet(s) By Mouth 1 to 2 Times Daily PRN   busPIRone (BUSPAR) 5 MG tablet Take 5 mg by mouth 2 (two) times daily.   escitalopram (LEXAPRO) 20 MG tablet Take 1.5 tablets (30 mg total) by mouth daily.   Melatonin 10 MG CAPS Take 1 capsule by mouth daily.   montelukast (SINGULAIR) 10 MG tablet Take 10 mg by mouth daily.   Multiple Vitamins-Minerals (MULTIVITAMIN WITH MINERALS) tablet Take 1 tablet by mouth daily.   Semaglutide,0.25 or 0.5MG /DOS, (OZEMPIC, 0.25 OR 0.5 MG/DOSE,) 2 MG/1.5ML SOPN INJECT 0.25 MG UNDER THE SKIN ONCE A WEEK   [DISCONTINUED] apixaban (ELIQUIS) 5 MG TABS tablet Take 1 tablet (5 mg total) by mouth 2 (two) times daily.   [DISCONTINUED] diltiazem (CARDIZEM CD) 180 MG 24 hr capsule Take 1 capsule (180 mg total) by mouth daily.   [DISCONTINUED] flecainide (TAMBOCOR) 150 MG tablet Take 1 tablet (150 mg total) by mouth 2 (two) times daily.   [DISCONTINUED] metoprolol succinate (TOPROL-XL) 50 MG 24 hr tablet Take 1 tablet (50 mg total) by mouth daily. Take with or immediately following a meal.   [DISCONTINUED] spironolactone (ALDACTONE) 25 MG tablet Take 1 tablet (25 mg total) by mouth daily.   [DISCONTINUED] valsartan (DIOVAN) 160 MG tablet Take 1 tablet (160 mg total) by mouth daily.   Radiology:  No results found.  Cardiac Studies:   Lexiscan Sestamibi stress test 05/11/2014: 1. The resting electrocardiogram demonstrated normal sinus rhythm and normal resting conduction.  The stress electrocardiogram was non-diagnostic due to low heart rate.  pharmacologic stress test. 2. The perfusion imaging study demonstrates the left ventricle to be mildly dilated with a left ventricular end-diastolic volume of 892 mL.  Otherwise there is no evidence of ischemia, normal wall motion, LVEF 55%.  This represents a low risk study.  Echocardiogram 05/09/2014: Left ventricle cavity is normal in size. Mild concentric hypertrophy of the left  ventricle. Normal global wall motion. Normal diastolic filling pattern. Calculated EF 64%. Insignificant pericardial effusion with clear fluids.  Sleep study 06/13/2014: Moderate obstructive sleep apnea, successfully treated with CPAP. Dr. Rexene Alberts   EKG  01/21/2022: Sinus rhythm at a rate of 61 bpm.  Biatrial enlargement.  Normal axis.  No evidence of ischemia or underlying injury pattern.  Compared EKG 06/13/2021, no poor R wave progression.  EKG 12/11/2020: Sinus rhythm at a rate of 68 bpm.  Biatrial enlargement.  Normal axis.  Nonspecific T wave abnormality.  Normal QT interval.  No evidence of ischemia or underlying injury pattern.  Compared to EKG 05/22/2020, no significant change.   EKG 05/23/2019: Normal sinus rhythm at rate of 57 bpm, biatrial abnormality, normal axis.  Poor R-wave  progression, probably normal variant.  Normal QT interval.  No evidence of ischemia. No significant change from   EKG 06/03/2018, previously LAE only.    EKG 05/03/2014: Atypical Atrial flutter with variable ventricular response, 110 bpm.  Normal axis, borderline low voltage complexes, nonspecific T abnormality.   Assessment     ICD-10-CM   1. Paroxysmal atrial fibrillation (HCC)  I48.0 EKG [id number]    Basic metabolic panel    TSH    CBC    apixaban (ELIQUIS) 5 MG TABS tablet    flecainide (TAMBOCOR) 150 MG tablet    metoprolol succinate (TOPROL-XL) 50 MG 24 hr tablet    2. Essential hypertension  Y17 Basic metabolic panel    Lipid Panel With LDL/HDL Ratio    TSH    spironolactone (ALDACTONE) 25 MG tablet    valsartan (DIOVAN) 160 MG tablet    3. Class 3 severe obesity due to excess calories with serious comorbidity and body mass index (BMI) of 40.0 to 44.9 in adult [facility])  C94.49 Basic metabolic panel   Q75.91 Lipid Panel With LDL/HDL Ratio    TSH    Hgb A1c w/o eAG     Meds ordered this encounter  Medications   apixaban (ELIQUIS) 5 MG TABS tablet    Sig: Take 1 tablet (5 mg total) by mouth 2 (two)  times daily.    Dispense:  180 tablet    Refill:  3   diltiazem (CARDIZEM CD) 180 MG 24 hr capsule    Sig: Take 1 capsule (180 mg total) by mouth daily.    Dispense:  90 capsule    Refill:  3   flecainide (TAMBOCOR) 150 MG tablet    Sig: Take 1 tablet (150 mg total) by mouth 2 (two) times daily.    Dispense:  180 tablet    Refill:  3   metoprolol succinate (TOPROL-XL) 50 MG 24 hr tablet    Sig: Take 1 tablet (50 mg total) by mouth daily. Take with or immediately following a meal.    Dispense:  90 tablet    Refill:  3   spironolactone (ALDACTONE) 25 MG tablet    Sig: Take 1 tablet (25 mg total) by mouth daily.    Dispense:  90 tablet    Refill:  3   valsartan (DIOVAN) 160 MG tablet    Sig: Take 1 tablet (160 mg total) by mouth daily.    Dispense:  90 tablet    Refill:  3   Medications Discontinued During This Encounter  Medication Reason   BELSOMRA 10 MG TABS    hydrocortisone 2.5 % cream    doxycycline (DORYX) 100 MG EC tablet    erythromycin ophthalmic ointment    ketoconazole (NIZORAL) 2 % cream    levocetirizine (XYZAL) 5 MG tablet    valsartan (DIOVAN) 160 MG tablet Reorder   spironolactone (ALDACTONE) 25 MG tablet Reorder   metoprolol succinate (TOPROL-XL) 50 MG 24 hr tablet Reorder   flecainide (TAMBOCOR) 150 MG tablet Reorder   diltiazem (CARDIZEM CD) 180 MG 24 hr capsule Reorder   apixaban (ELIQUIS) 5 MG TABS tablet Reorder    This patients CHA2DS2-VASc Score 2 (HTN, F) and yearly risk of stroke 2.2%.   Recommendations:   KENA LIMON  is a 56 y.o. African-American female with morbid obesity, history of gastric banding in 2011, due to failure of the same, underwent gastric bypass surgery in Sept 2018, paroxysmal atrial fibrillation, hypertension, morbid obesity, OSA on CPAP.  Patient presents for 106-monthfollow-up.  Last office visit patient was stable from a cardiovascular standpoint and no changes were made.  Patient is presently maintaining normal sinus  rhythm and is essentially asymptomatic.  She continues to tolerate coagulation without bleeding diathesis, will continue Eliquis.  She is congratulated on her weight loss and encouraged to continue with this.  We will continue present medications, I have sent refills.  Pressure is well controlled.   Patient is due for repeat laboratory testing, will therefore obtain basic lab panels including lipids, BMP, CBC, TSH, and A1c.  Patient is otherwise stable from a cardiovascular standpoint.  Follow-up in 1 year, sooner if needed.   CAlethia Berthold PA-C 01/21/2022, 4:00 PM Office: [phone]

## 2022-02-13 ENCOUNTER — Other Ambulatory Visit: Payer: Self-pay

## 2022-02-13 ENCOUNTER — Telehealth: Payer: Self-pay

## 2022-02-13 DIAGNOSIS — I10 Essential (primary) hypertension: Secondary | ICD-10-CM

## 2022-02-13 MED ORDER — VALSARTAN 160 MG PO TABS: 160.0000 mg | ORAL_TABLET | Freq: Every day | ORAL | 0 refills | Status: DC

## 2022-02-13 NOTE — Telephone Encounter (Signed)
Discussed with JG as well. The risk on interactions is low so no need to make changes to medications, she may be on both.

## 2022-02-13 NOTE — Telephone Encounter (Signed)
Patient called back she is aware

## 2022-02-13 NOTE — Telephone Encounter (Signed)
Tried calling patient no answer left a detailed vm

## 2022-02-23 ENCOUNTER — Telehealth: Payer: Self-pay | Admitting: *Deleted

## 2022-02-23 NOTE — Telephone Encounter (Signed)
I called and spoke to [place] at [place].  She does show that pt did receive cpap 2015. No other documentation.

## 2022-02-24 ENCOUNTER — Ambulatory Visit (INDEPENDENT_AMBULATORY_CARE_PROVIDER_SITE_OTHER): Payer: No Typology Code available for payment source | Admitting: Neurology

## 2022-02-24 ENCOUNTER — Encounter: Payer: Self-pay | Admitting: Neurology

## 2022-02-24 VITALS — BP 140/78 | HR 58 | Ht 69.0 in | Wt 252.6 lb

## 2022-02-24 DIAGNOSIS — G4733 Obstructive sleep apnea (adult) (pediatric): Secondary | ICD-10-CM | POA: Diagnosis not present

## 2022-02-24 DIAGNOSIS — R001 Bradycardia, unspecified: Secondary | ICD-10-CM

## 2022-02-24 DIAGNOSIS — Z9884 Bariatric surgery status: Secondary | ICD-10-CM

## 2022-02-24 DIAGNOSIS — I480 Paroxysmal atrial fibrillation: Secondary | ICD-10-CM

## 2022-02-24 DIAGNOSIS — R634 Abnormal weight loss: Secondary | ICD-10-CM

## 2022-02-24 DIAGNOSIS — G4719 Other hypersomnia: Secondary | ICD-10-CM | POA: Diagnosis not present

## 2022-02-24 DIAGNOSIS — E669 Obesity, unspecified: Secondary | ICD-10-CM | POA: Diagnosis not present

## 2022-02-24 NOTE — Progress Notes (Signed)
Subjective:    [name] is a 56 y.o. female.  HPI    Star Age, MD, PhD [facility] [address], [address] [place], [place] [address]  Dear [name],  I saw your patient, [name], upon your kind request in my sleep clinic today for evaluation of sleep apnea.  The patient is a today.  As you know, Ms. [name] is a 56 year old right-handed woman with an underlying medical history of hypertension, paroxysmal A-fib, osteoarthritis, vitamin D deficiency, allergic rhinitis, migraine headaches, depression, anxiety, and obesity, who was previously diagnosed with obstructive sleep apnea.  He reports that she has not used her CPAP in about 3 years.  She had a Respironics machine and after the recall and during the pandemic she has not really used it.  She found it cumbersome to use but does indicate benefit from using it, felt that it was helpful but very cumbersome when she had to travel and she still has to travel quite a bit to [place] as part of her job.  She would be willing to get reevaluated and consider CPAP therapy again.  She has had some weight fluctuation, is working on weight loss.  Compared to her sleep study from 2015 she has lost over 15 pounds.  She is divorced, she works full-time as a Radiation protection practitioner.  She is also on the board of a bank.  She works some from home and some in the office and travels.  She lives with her 2 daughters, ages 25 and 59, the 31 year old is soon to move out, the 56 year old is in college.  She is a non-smoker, she drinks mostly decaf drinks including caffeine and soda and very occasional caffeine such as tea.  She drinks alcohol nearly daily, limits herself to 1 or 2 drinks typically.  Bedtime is around 10 but she does have difficulty initiating and maintaining sleep.  Rise time varies, often awake before 7.  She does not have night to night nocturia but has had occasional morning  headaches.  These are not severe, described as dull and achy.  Does not typically take medication for her headaches but has had severe headaches in the past including cluster headaches.   I had evaluated her in 2015 for sleep apnea concern, she had been referred back to her cardiologist at the time.  She had a split-night sleep study on 06/12/2014 which indicated an AHI of 14.8/h, REM sleep absent, O2 nadir 85% at baseline.  She was titrated on CPAP from 5 cm to 12 cm.  CPAP of 10 cm was effective.  She has not been followed in this clinic since 2015.   I reviewed your office note from 11/04/2021.  Her Epworth sleepiness score is 9 out of 24, fatigue severity score is 34 out of 63.  She has not used her CPAP in years, she has not used it after [employer] had a recall on their CPAP machines.  A compliance download is not available for my review today.   Previously:   05/09/14: 56 year old right-handed woman with an underlying medical history of hypertension, allergies, morbid obesity, status post lap band surgery in July 2010, gastroesophageal reflux disease, chronic sinus disease, endometriosis, who was recently diagnosed with new onset atrial fibrillation earlier this month. She is scheduled for an echocardiogram as well as a myocardial SPECT test. She reports snoring and complains of daytime tiredness. 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 She has been told, that she has apneic pauses in sleep. She has occasional nocturia, and has no morning headaches.  She has reverted back to sinus rhythm. She reports feeling marginally rested upon awakening. She wakes up on an average 4 times in the middle of the night and has to go to the bathroom 1 time per night, but not every night. She denies morning headaches.  She denies excessive daytime somnolence (EDS) and Her Epworth Sleepiness Score (ESS) is 2/24 today. She has not fallen asleep while driving. The patient has not been taking  a planned nap.  She has been known to snore for the past years. Snoring is reportedly moderate, and associated with choking sounds and witnessed apneas. The patient denies a sense of choking or strangling feeling. There is no report of nighttime reflux, with occasional nighttime cough experienced. The patient has not noted any RLS symptoms and is not known to kick while asleep or before falling asleep. There is family history of OSA in her father and possibly her mother.  She is a restless sleeper and in the morning, the bed is quite disheveled.   She denies cataplexy, sleep paralysis, hypnagogic or hypnopompic hallucinations, or sleep attacks. She does not report any vivid dreams, nightmares, dream enactments, or parasomnias, such as sleep talking or sleep walking. The patient has not had a sleep study or a home sleep test.  She consumes 2 caffeinated beverages per day, usually in the form of sodas.  Her Past Medical History Is Significant For: Past Medical History:  Diagnosis Date   Adenomyosis 7/07   Anemia    Anxiety    Arthritis    Atrial fibrillation (HCC)    Compartment syndrome of lower extremity ([place]) 08/13/2014   Depression    Dysrhythmia    GERD (gastroesophageal reflux disease)    H/O blood clots    superficial blood clots with OCPs   Hypertension    Insomnia    Lower extremity edema    Sinus congestion    saw ENT, had nasal surgery to clean out nasal area for bacteria, mold   Sleep apnea    Uterine fibroid    Vitamin D deficiency     Her Past Surgical History Is Significant For: Past Surgical History:  Procedure Laterality Date   ABDOMINAL HYSTERECTOMY     BREAST BIOPSY     x2   CESAREAN SECTION     ESOPHAGOGASTRODUODENOSCOPY N/A 06/15/2016   Procedure: ESOPHAGOGASTRODUODENOSCOPY (EGD);  Surgeon: Alphonsa Overall, MD;  Location: [facility];  Service: General;  Laterality: N/A;   ESOPHAGOGASTRODUODENOSCOPY (EGD) WITH PROPOFOL N/A 09/20/2018   Procedure:  ESOPHAGOGASTRODUODENOSCOPY (EGD) WITH PROPOFOL;  Surgeon: Carol Ada, MD;  Location: [facility];  Service: Endoscopy;  Laterality: N/A;   LAPAROSCOPIC GASTRIC BANDING  2010   LAPAROSCOPIC GASTRIC BANDING  2011   LAPAROSCOPY     x2   LAPAROSCOPY N/A 09/21/2018   Procedure: LAPAROSCOPY DIAGNOSTIC;  Surgeon: Greer Pickerel, MD;  Location: [facility];  Service: General;  Laterality: N/A;   MOUTH SURGERY     NASAL SINUS SURGERY  4/13   PARTIAL KNEE ARTHROPLASTY Right 06/26/2016   Procedure: RIGHT UNICOMPARTMENTAL KNEE CODYLE AND PLATEAU MEDIAL COMPARTMENT;  Surgeon: Renette Butters, MD;  Location: [facility];  Service: Orthopedics;  Laterality: Right;   ROUX-EN-Y GASTRIC BYPASS  2018    Her Family History Is Significant For: Family History  Problem Relation Age of Onset   Diabetes Mother  Hypertension Mother    Hyperlipidemia Mother    Heart disease Mother    Depression Mother    Cancer Father        prostate and lung   Diabetes Father    Hypertension Father    Alcoholism Father    Hypertension Brother        x2   Sleep apnea Brother    Hypothyroidism Brother    Hypothyroidism Maternal Grandmother    Ovarian cancer Paternal Grandmother     Her Social History Is Significant For: Social History   Socioeconomic History   Marital status: Divorced    Spouse name: Not on file   Number of children: 2   Years of education: MASTERS   Highest education level: Not on file  Occupational History   Occupation: Engineer, building services: [employer]  Tobacco Use   Smoking status: Never   Smokeless tobacco: Never  Vaping Use   Vaping Use: Never used  Substance and Sexual Activity   Alcohol use: Yes    Alcohol/week: 3.0 - 4.0 standard drinks of alcohol    Types: 3 - 4 Standard drinks or equivalent per week    Comment: SOCIALLY   Drug use: No   Sexual activity: Yes    Partners: Male    Birth control/protection: Surgical    Comment: TAH  Other Topics Concern   Not on  file  Social History Narrative   Patient is divorced with 2 children.   Patient is right handed.   Patient has her Master's degree.   Patient drinks 1-2 sodas daily.       Social Determinants of Health   Financial Resource Strain: Not on file  Food Insecurity: Not on file  Transportation Needs: Not on file  Physical Activity: Not on file  Stress: Not on file  Social Connections: Not on file    Her Allergies Are:  Allergies  Allergen Reactions   Flagyl [Metronidazole] Anaphylaxis   Levaquin [Levofloxacin] Other (See Comments)    Pains and aches throughout body, pt states cant move well  :   Her Current Medications Are:  Outpatient Encounter Medications as of 02/24/2022  Medication Sig   ALPRAZolam (XANAX) 0.5 MG tablet SMARTSIG:1-2 Tablet(s) By Mouth 1 to 2 Times Daily PRN   apixaban (ELIQUIS) 5 MG TABS tablet Take 1 tablet (5 mg total) by mouth 2 (two) times daily.   busPIRone (BUSPAR) 5 MG tablet Take 5 mg by mouth 2 (two) times daily.   diltiazem (CARDIZEM CD) 180 MG 24 hr capsule Take 1 capsule (180 mg total) by mouth daily.   escitalopram (LEXAPRO) 20 MG tablet Take 20 mg by mouth daily.   flecainide (TAMBOCOR) 150 MG tablet Take 1 tablet (150 mg total) by mouth 2 (two) times daily.   Melatonin 10 MG CAPS Take 1 capsule by mouth daily.   metoprolol succinate (TOPROL-XL) 50 MG 24 hr tablet Take 1 tablet (50 mg total) by mouth daily. Take with or immediately following a meal.   montelukast (SINGULAIR) 10 MG tablet Take 10 mg by mouth daily.   Multiple Vitamins-Minerals (MULTIVITAMIN WITH MINERALS) tablet Take 1 tablet by mouth daily.   Semaglutide,0.25 or 0.'5MG'$ /DOS, (OZEMPIC, 0.25 OR 0.5 MG/DOSE,) 2 MG/1.5ML SOPN INJECT 0.25 MG UNDER THE SKIN ONCE A WEEK   spironolactone (ALDACTONE) 25 MG tablet Take 1 tablet (25 mg total) by mouth daily.   valsartan (DIOVAN) 160 MG tablet Take 1 tablet (160 mg total) by mouth daily.  escitalopram (LEXAPRO) 20 MG tablet Take 1.5 tablets  (30 mg total) by mouth daily.   No facility-administered encounter medications on file as of 02/24/2022.  :   Review of Systems:  Out of a complete 14 point review of systems, all are reviewed and negative with the exception of these symptoms as listed below:   Review of Systems  Neurological:        Pt here for sleep consult  Pt snore,headaches,fatigue,hypertension,AFIB . Pt states last sleep study was 2015 . Pt states she had a dream station Pt states when they had a recall on dream station she never used CPAP again    ESS;9 FSS:34    Objective:  Neurological Exam  Physical Exam Physical Examination:   Vitals:   02/24/22 0926  BP: 140/78  Pulse: (!) 58   General Examination: The patient is a very pleasant 56 y.o. female in no acute distress. She appears well-developed and well-nourished and well groomed.   HEENT: Normocephalic, atraumatic, pupils are equal, round and reactive to light, extraocular tracking is well-preserved, face is symmetric with normal facial animation, speech is clear without dysarthria, hypophonia or voice tremor.  Airway examination reveals no significant mouth dryness, good dental hygiene, mild to moderate airway crowding secondary to redundant soft palate and normal uvula.  Slightly wider tongue, tongue protrudes centrally and palate elevates symmetrically, Mallampati class II.  Neck circumference of 14-3/8 inches.  Mild overbite noted.    Chest: Clear to auscultation without wheezing, rhonchi or crackles noted.   Heart: S1+S2+0, regular and normal without murmurs, rubs or gallops noted.    Abdomen: Soft, non-tender and non-distended.   Extremities: There is no pitting edema in the distal lower extremities bilaterally.    Skin: Warm and dry without trophic changes noted.    Musculoskeletal: exam reveals no obvious joint deformities.    Neurologically:  Mental status: The patient is awake, alert and oriented in all 4 spheres. Her immediate and  remote memory, attention, language skills and fund of knowledge are appropriate. There is no evidence of aphasia, agnosia, apraxia or anomia. Speech is clear with normal prosody and enunciation. Thought process is linear. Mood is normal and affect is normal.  Cranial nerves II - XII are as described above under HEENT exam.  Motor exam: Normal bulk, strength and tone is noted. There is no obvious tremor.  Fine motor skills and coordination: Grossly intact.  Cerebellar testing: No dysmetria or intention tremor. There is no truncal or gait ataxia.  Sensory exam: intact to light touch.  Gait, station and balance: She stands easily. No veering to one side is noted. No leaning to one side is noted. Posture is age-appropriate and stance is narrow based. Gait shows normal stride length and normal pace. No problems turning are noted.   Assessment and Plan:   In summary, INEZ STANTZ is a very pleasant 56 y.o.-year old female with an underlying medical history of hypertension, paroxysmal A-fib, osteoarthritis, vitamin D deficiency, allergic rhinitis, migraine headaches, depression, anxiety, and obesity, who presents for reevaluation of her obstructive sleep apnea.  She was diagnosed with sleep apnea in 2018 and had a CPAP machine but has not used her CPAP in about 3 years.  She has had some weight fluctuation.  I had a long chat with the patient about my findings and the diagnosis of sleep apnea, particularly OSA, its prognosis and treatment options. We talked about medical/conservative treatments, surgical interventions and non-pharmacological approaches for symptom control. I explained,  in particular, the risks and ramifications of untreated moderate to severe OSA, especially with respect to developing cardiovascular disease down the road, including congestive heart failure (CHF), difficult to treat hypertension, cardiac arrhythmias (particularly A-fib), neurovascular complications including TIA, stroke and  dementia. Even type 2 diabetes has, in part, been linked to untreated OSA. Symptoms of untreated OSA may include (but may not be limited to) daytime sleepiness, nocturia (i.e. frequent nighttime urination), memory problems, mood irritability and suboptimally controlled or worsening mood disorder such as depression and/or anxiety, lack of energy, lack of motivation, physical discomfort, as well as recurrent headaches, especially morning or nocturnal headaches. We talked about the importance of maintaining a healthy lifestyle and striving for healthy weight. I recommended the following at this time: sleep study.  I outlined the differences between a laboratory attended sleep study which is considered more comprehensive and accurate over the option of a home sleep test (HST); the latter may lead to underestimation of sleep disordered breathing in some instances and does not help with diagnosing upper airway resistance syndrome and is not accurate enough to diagnose primary central sleep apnea typically. I explained the different sleep test procedures to the patient in detail and also outlined possible surgical and non-surgical treatment options of OSA, including the use of a pressure airway pressure (PAP) device (ie CPAP, AutoPAP/APAP or BiPAP in certain circumstances), a custom-made dental device (aka oral appliance, which would require a referral to a specialist dentist or orthodontist typically, and is generally speaking not considered a good choice for patients with full dentures or edentulous state), upper airway surgical options, such as traditional UPPP (which is not considered a first-line treatment) or the Inspire device (hypoglossal nerve stimulator, which would involve a referral for consultation with an ENT surgeon, after careful selection, following inclusion criteria). I explained the PAP treatment option to the patient in detail, as this is generally considered first-line treatment.  The patient  indicated that she would be willing to try PAP therapy again, if the need arises.  She may benefit from using an oral appliance as a backup treatment or secondary treatment for when she travels.  I explained the importance of being compliant with PAP treatment, not only for insurance purposes but primarily to improve patient's symptoms symptoms, and for the patient's long term health benefit, including to reduce Her cardiovascular risks longer-term.    We will pick up our discussion about the next steps and treatment options after testing.  We will keep her posted as to the test results by phone call and/or MyChart messaging where possible.  We will plan to follow-up in sleep clinic accordingly as well.  I answered all her questions today and the patient was in agreement.   I encouraged her to call with any interim questions, concerns, problems or updates or email [place] through [place].  Generally speaking, sleep test authorizations may take up to 2 weeks, sometimes less, sometimes longer, the patient is encouraged to get in touch with [place] if they do not hear back from the sleep lab staff directly within the next 2 weeks.  Thank you very much for allowing me to participate in the care of this nice patient. If I can be of any further assistance to you please do not hesitate to call me at [phone].  Sincerely,   Star Age, MD, PhD

## 2022-02-24 NOTE — Patient Instructions (Addendum)
It was nice to see you again today.   Based on your symptoms and your exam I believe you are still at risk for obstructive sleep apnea and would benefit from reevaluation as it has been many years and you need new supplies and an updated machine.  We may consider you for an oral appliance at least as a backup for treatment, especially when you travel a lot.  We may also think about evaluation for Inspire but this is not first-line. Therefore, I think we should proceed with a sleep study to determine how severe your sleep apnea is. If you have more than mild OSA, I want you to consider ongoing treatment with CPAP. Please remember, the risks and ramifications of moderate to severe obstructive sleep apnea or OSA are: Cardiovascular disease, including congestive heart failure, stroke, difficult to control hypertension, arrhythmias, and even type 2 diabetes has been linked to untreated OSA. Sleep apnea causes disruption of sleep and sleep deprivation in most cases, which, in turn, can cause recurrent headaches, problems with memory, mood, concentration, focus, and vigilance. Most people with untreated sleep apnea report excessive daytime sleepiness, which can affect their ability to drive. Please do not drive if you feel sleepy.   I will likely see you back after your sleep study to go over the test results and where to go from there. We will call you after your sleep study to advise about the results (most likely, you will hear from [place], my nurse) and to set up an appointment at the time, as necessary.    Our sleep lab administrative assistant will call you to schedule your sleep study. If you don't hear back from her by about 2 weeks from now, please feel free to call her at [phone]. You can leave a message with your phone number and concerns, if you get the voicemail box. She will call back as soon as possible.

## 2022-03-10 ENCOUNTER — Other Ambulatory Visit: Payer: Self-pay | Admitting: Student

## 2022-03-10 DIAGNOSIS — I10 Essential (primary) hypertension: Secondary | ICD-10-CM

## 2022-03-18 ENCOUNTER — Encounter (INDEPENDENT_AMBULATORY_CARE_PROVIDER_SITE_OTHER): Payer: Self-pay

## 2022-04-09 ENCOUNTER — Telehealth: Payer: Self-pay | Admitting: Neurology

## 2022-04-09 NOTE — Telephone Encounter (Signed)
[employer]- [employer] no auth req spoke to [place] ref # [id number].  Patient is scheduled at [facility] for 05/04/22 at 9 pm.  Mailed packet to the patient.

## 2022-05-04 ENCOUNTER — Ambulatory Visit (INDEPENDENT_AMBULATORY_CARE_PROVIDER_SITE_OTHER): Payer: No Typology Code available for payment source | Admitting: Neurology

## 2022-05-04 DIAGNOSIS — G4719 Other hypersomnia: Secondary | ICD-10-CM

## 2022-05-04 DIAGNOSIS — G4720 Circadian rhythm sleep disorder, unspecified type: Secondary | ICD-10-CM

## 2022-05-04 DIAGNOSIS — R634 Abnormal weight loss: Secondary | ICD-10-CM

## 2022-05-04 DIAGNOSIS — R0683 Snoring: Secondary | ICD-10-CM

## 2022-05-04 DIAGNOSIS — G4733 Obstructive sleep apnea (adult) (pediatric): Secondary | ICD-10-CM

## 2022-05-04 DIAGNOSIS — Z9884 Bariatric surgery status: Secondary | ICD-10-CM

## 2022-05-04 DIAGNOSIS — I480 Paroxysmal atrial fibrillation: Secondary | ICD-10-CM

## 2022-05-04 DIAGNOSIS — R001 Bradycardia, unspecified: Secondary | ICD-10-CM

## 2022-05-04 DIAGNOSIS — E669 Obesity, unspecified: Secondary | ICD-10-CM

## 2022-05-11 NOTE — Procedures (Signed)
[facility] POLYSOMNOGRAPHY  INTERPRETATION REPORT   STUDY DATE:  05/04/2022     PATIENT NAME:  [name]         DATE OF BIRTH:  05/30/1966  PATIENT ID:  [id number]    TYPE OF STUDY:  PSG  READING PHYSICIAN: Star Age, MD, PhD REFERRED BY: Samuel Bouche, NP SCORING TECHNICIAN: ACQ System 1, RPSGT   HISTORY: 55 year old woman with a history of hypertension, paroxysmal A-fib, osteoarthritis, vitamin D deficiency, allergic rhinitis, migraine headaches, depression, anxiety, and obesity, who was previously diagnosed with obstructive sleep apnea. She has not used her CPAP in about 3 years. She has lost over 15 pounds since her sleep apnea diagnosis in 2015. Her Epworth sleepiness score is 9 out of 24, fatigue severity score is 34 out of 63. ?   Height: 69 in Weight: 252 lb (BMI 37) Neck Size: 15 in  MEDICATIONS: Xanax, Eliquis, Buspar, Cardizem, Lexapro, Tambocor, Melatonin, Toprol - XL, Singulair, Multivitamin, Ozempic, Aldactone, Diovan TECHNICAL DESCRIPTION: A registered sleep technologist  was in attendance for the duration of the recording.  Data collection, scoring, video monitoring, and reporting were performed in compliance with the AASM Manual for the Scoring of Sleep and Associated Events; (Hypopnea is scored based on the criteria listed in Section VIII D. 1b in the AASM Manual V2.6 using a 4% oxygen desaturation rule or Hypopnea is scored based on the criteria listed in Section VIII D. 1a in the AASM Manual V2.6 using 3% oxygen desaturation and /or arousal rule).   SLEEP CONTINUITY AND SLEEP ARCHITECTURE:  Lights-out was at 21:50: and lights-on at  05:29: for a total recording time of 7 hours and 38.5 minutes. Total sleep time ( TST) was 272.5 minutes with a decreased sleep efficiency at 59.4%.  BODY POSITION:  TST was divided  between the following sleep positions: 70.5% supine;  29.5% lateral;  0% prone. Duration of total sleep and percent of total  sleep in their respective position is as follows: supine 192 minutes (70%), non-supine 81 minutes (30%); right 00 minutes (0%), left 80 minutes (30%), and prone 00 minutes (0%).  Total supine REM sleep time was 16 minutes (100% of total REM sleep).  Sleep latency was increased at 47.0 minutes.  REM sleep latency was markedly delayed at 392.5 minutes. Of the total sleep time, the percentage of stage N1 sleep was 3.9%, stage N2 sleep was 66%, which is increased, stage N3 sleep was 24.6%, and REM sleep was 6.1%, which is markedly reduced.  Wake after sleep onset (WASO) time accounted for 139 minutes with mild to moderate sleep fragmentation noted and one longer period of wakefulness.   RESPIRATORY MONITORING:   Based on CMS criteria (using a 4% oxygen desaturation rule for scoring hypopneas), there were 0 apneas (0 obstructive; 0 central; 0 mixed), and 6 hypopneas.  Apnea index was 0.0. Hypopnea index was 1.3. The apnea-hypopnea index was 1.3 overall (1.6 supine, 0 non-supine; 0.0 REM, 0.0 supine REM).  There were 0 respiratory effort-related arousals (RERAs).  The RERA index was 0 events/h. Total respiratory disturbance index (RDI) was 1.3 events/h. RDI results showed: supine RDI  1.6 /h; non-supine RDI 0.7 /h; REM RDI 0.0 /h, supine REM RDI 0.0 /h.   Based on AASM criteria (using a 3% oxygen desaturation and /or arousal rule for scoring hypopneas), there were 0 apneas (0 obstructive; 0 central; 0 mixed), and 13 hypopneas. Apnea index was 0.0. Hypopnea index was 2.9. The apnea-hypopnea index was  2.9 overall (3.8 supine, 0 non-supine; 10.9 REM, 10.9 supine REM).  There were 0 respiratory effort-related arousals (RERAs).  The RERA index was 0 events/h. Total respiratory disturbance index (RDI) was 2.9 events/h. RDI results showed: supine RDI  3.8 /h; non-supine RDI 0.7 /h; REM RDI 10.9 /h, supine REM RDI 10.9 /h.  OXIMETRY: Oxyhemoglobin Saturation Nadir during sleep was at  90%) from a mean of 95%.  Of the  Total sleep time (TST)   hypoxemia (=<88%) was present for  0.0 minutes, or 0.0% of total sleep time.  LIMB MOVEMENTS: There were 0 periodic limb movements of sleep (0.0/hr), of which 0 (0.0/hr) were associated with an arousal. AROUSAL: There were 29 arousals in total, for an arousal index of 6 arousals/hour.  Of these, 4 were identified as respiratory-related arousals (1 /h), 0 were PLM-related arousals (0 /h), and 33 were non-specific arousals (7 /h). Snoring was classified as mild and intermittent. EEG:??The EEG was of normal amplitude and frequency, with symmetric?manifestation of?sleep?stages. EKG: The electrocardiogram?showed normal sinus rhythm. The average heart rate during sleep was 70 bpm.  The heart rate during sleep varied between a minimum of Tachycardia and  a maximum of  80 bpm. AUDIO and VIDEO: The video and audio analysis did not show any abnormal or unusual behaviors, movements, phonations or vocalizations. The patient took one restroom break. She used her computer at times during the testing time. She did report being unable to sleep due to the wires. She put a sleep mask on later at night. She took melatonin prior to lights out. She used/looked at her computer twice during periods of wakefulness.  A Post study questionnaire was not completed by the patient. IMPRESSION:  1.?Primary Snoring  2.?Dysfunctions associated with sleep stages or arousal from sleep RECOMMENDATIONS: 1.?This study does not demonstrate any significant obstructive or central sleep disordered breathing with an AHI of less than 5/hour.??Her total AHI was 1.3/h, O2 nadir 90%.?Intermittent mild snoring was detected. Treatment with a positive airway pressure device, such as CPAP or autoPAP is not indicated.  2.?This study shows sleep fragmentation and abnormal sleep stage percentages; these are nonspecific findings and per se do not signify an intrinsic sleep disorder or a cause for the patient's sleep-related  symptoms. Causes include (but are not limited to) the first night effect of the sleep study, circadian rhythm disturbances, medication effect or an underlying mood disorder or medical problem.  3.?The patient should be cautioned not to drive, work at heights, or operate dangerous or heavy equipment when tired or sleepy. Review and reiteration of good sleep hygiene measures should be pursued with any patient. 4.?The patient will be advised to follow up with the referring provider, who will be notified of the test results. ? I certify that I have reviewed the entire raw data recording prior to the issuance of this report in accordance with the Standards of Accreditation of the American Academy of Sleep Medicine (AASM). Star Age, MD, PhD Diplomat, ABPN (Neurology and Sleep)

## 2022-05-12 ENCOUNTER — Telehealth: Payer: Self-pay

## 2022-05-12 NOTE — Telephone Encounter (Signed)
I called patient to discuss. No answer, left a message asking her to call us back. If patient calls back another day, please route to POD 4.

## 2022-05-12 NOTE — Telephone Encounter (Signed)
-----   Message from Star Age, MD sent at 05/11/2022  6:52 PM EDT ----- Patient referred by Samuel Bouche, NP, seen by me on 02/24/2022 for reevaluation of her sleep apnea, diagnostic PSG on 05/04/2022.   Please call and notify the patient that the recent sleep study did not show any significant obstructive sleep apnea. Her total AHI was 1.3/h, O2 nadir 90%. Intermittent mild snoring was detected. Treatment with a positive airway pressure device, such as CPAP or autoPAP is not indicated.  At this juncture, she can follow-up with her primary care as scheduled/planned.   Thanks,  Star Age, MD, PhD [facility])

## 2022-05-12 NOTE — Telephone Encounter (Signed)
Patient returned my call.  I discussed her sleep study results and recommendations.  Patient will follow-up with her PCP as scheduled.  Patient verbalized understanding of results and had no questions or concerns at this time.

## 2022-08-27 ENCOUNTER — Other Ambulatory Visit: Payer: Self-pay

## 2022-08-27 DIAGNOSIS — I480 Paroxysmal atrial fibrillation: Secondary | ICD-10-CM

## 2022-08-27 MED ORDER — METOPROLOL SUCCINATE ER 50 MG PO TB24: 50.0000 mg | ORAL_TABLET | Freq: Every day | ORAL | 1 refills | Status: DC

## 2022-09-14 ENCOUNTER — Emergency Department (HOSPITAL_BASED_OUTPATIENT_CLINIC_OR_DEPARTMENT_OTHER): Payer: No Typology Code available for payment source | Admitting: Radiology

## 2022-09-14 ENCOUNTER — Emergency Department (HOSPITAL_BASED_OUTPATIENT_CLINIC_OR_DEPARTMENT_OTHER): Payer: No Typology Code available for payment source

## 2022-09-14 ENCOUNTER — Other Ambulatory Visit: Payer: Self-pay

## 2022-09-14 ENCOUNTER — Encounter (HOSPITAL_BASED_OUTPATIENT_CLINIC_OR_DEPARTMENT_OTHER): Payer: Self-pay | Admitting: Radiology

## 2022-09-14 ENCOUNTER — Emergency Department (HOSPITAL_BASED_OUTPATIENT_CLINIC_OR_DEPARTMENT_OTHER)
Admission: EM | Admit: 2022-09-14 | Discharge: 2022-09-14 | Disposition: A | Discharge status: Home / Self Care | Payer: No Typology Code available for payment source | Attending: Emergency Medicine | Admitting: Emergency Medicine

## 2022-09-14 DIAGNOSIS — Z7901 Long term (current) use of anticoagulants: Secondary | ICD-10-CM | POA: Insufficient documentation

## 2022-09-14 DIAGNOSIS — Z79899 Other long term (current) drug therapy: Secondary | ICD-10-CM | POA: Insufficient documentation

## 2022-09-14 DIAGNOSIS — M79604 Pain in right leg: Secondary | ICD-10-CM | POA: Insufficient documentation

## 2022-09-14 DIAGNOSIS — I10 Essential (primary) hypertension: Secondary | ICD-10-CM | POA: Insufficient documentation

## 2022-09-14 DIAGNOSIS — M25551 Pain in right hip: Secondary | ICD-10-CM

## 2022-09-14 NOTE — ED Provider Notes (Signed)
[facility] Provider Note   CSN: [id number] Arrival date & time: 09/14/22  1555     History  Chief Complaint  Patient presents with   Hip Pain    [name] is a 57 y.o. female.  With a history of paroxysmal A-fib currently on Eliquis, anxiety, hypertension, GERD, sleep apnea, anemia, history of blood clots who presents to the ED for evaluation of right-sided hip pain.  She states that when she woke up this morning she noticed some bruising over the greater trochanter on the right side.  She also states that she has had some mild aching pain as well.  She denies history of injury.  States that last night she had a burning pain in her lower abdomen which lasted for a few hours and then resolved.  Denies lower extremity swelling.  Reports compliance with her Eliquis and has not missed any doses.  She has traveled recently to [place] and back by airplane.  This was approximately 2 weeks ago.  Denies numbness or tingling in the affected extremity.  Denies chest pain or shortness of breath.  She states she has some aching pain in her musculature of the right hip at rest and deeper pain with movement.  Pain is rated at a 4 out of 10 at rest and 7 out of 10 with movements.  She has not taken anything for symptoms prior to arrival.   Hip Pain       Home Medications Prior to Admission medications   Medication Sig Start Date End Date Taking? Authorizing Provider  ALPRAZolam Duanne Moron) 0.5 MG tablet SMARTSIG:1-2 Tablet(s) By Mouth 1 to 2 Times Daily PRN 08/01/19   [provider]  apixaban (ELIQUIS) 5 MG TABS tablet Take 1 tablet (5 mg total) by mouth 2 (two) times daily. 01/21/22   Cantwell, Celeste C, PA-C  busPIRone (BUSPAR) 5 MG tablet Take 5 mg by mouth 2 (two) times daily. 12/12/21   [provider]  diltiazem (CARDIZEM CD) 180 MG 24 hr capsule Take 1 capsule (180 mg total) by mouth daily. 01/21/22   Cantwell, Celeste C, PA-C   escitalopram (LEXAPRO) 20 MG tablet Take 1.5 tablets (30 mg total) by mouth daily. 03/01/20 01/21/22  Megan Salon, MD  escitalopram (LEXAPRO) 20 MG tablet Take 20 mg by mouth daily.    [provider]  flecainide (TAMBOCOR) 150 MG tablet Take 1 tablet (150 mg total) by mouth 2 (two) times daily. 01/21/22   Cantwell, Celeste C, PA-C  Melatonin 10 MG CAPS Take 1 capsule by mouth daily.    [provider]  metoprolol succinate (TOPROL-XL) 50 MG 24 hr tablet Take 1 tablet (50 mg total) by mouth daily. Take with or immediately following a meal. 08/27/22   Tolia, Sunit, DO  montelukast (SINGULAIR) 10 MG tablet Take 10 mg by mouth daily. 11/28/21   [provider]  Multiple Vitamins-Minerals (MULTIVITAMIN WITH MINERALS) tablet Take 1 tablet by mouth daily.    [provider]  Semaglutide,0.25 or 0.'5MG'$ /DOS, (OZEMPIC, 0.25 OR 0.5 MG/DOSE,) 2 MG/1.5ML SOPN INJECT 0.25 MG UNDER THE SKIN ONCE A WEEK 05/08/21   [provider]  spironolactone (ALDACTONE) 25 MG tablet Take 1 tablet (25 mg total) by mouth daily. 01/21/22   Cantwell, Celeste C, PA-C  valsartan (DIOVAN) 160 MG tablet TAKE 1 TABLET BY MOUTH EVERY DAY 03/10/22   Adrian Prows, MD      Allergies    Flagyl [metronidazole] and Levaquin [  levofloxacin]    Review of Systems   Review of Systems  Musculoskeletal:  Positive for arthralgias and myalgias.  All other systems reviewed and are negative.   Physical Exam Updated Vital Signs BP (!) 133/102 (BP Location: Right Arm)   Pulse 70   Temp 98 F (36.7 C) (Oral)   Resp 17   Ht '5\' 9"'$  (1.753 m)   Wt 107.5 kg   LMP 08/10/2005   SpO2 100%   BMI 35.00 kg/m  Physical Exam Vitals and nursing note reviewed. Exam conducted with a chaperone present Debe Coder, Therapist, sports).  Constitutional:      General: She is not in acute distress.    Appearance: Normal appearance. She is well-developed. She is obese. She is not ill-appearing, toxic-appearing or diaphoretic.  HENT:      Head: Normocephalic and atraumatic.  Eyes:     Conjunctiva/sclera: Conjunctivae normal.  Cardiovascular:     Rate and Rhythm: Normal rate and regular rhythm.     Pulses: Normal pulses.     Heart sounds: No murmur heard. Pulmonary:     Effort: Pulmonary effort is normal. No respiratory distress.     Breath sounds: Normal breath sounds. No stridor. No wheezing, rhonchi or rales.  Abdominal:     Palpations: Abdomen is soft.     Tenderness: There is no abdominal tenderness.  Musculoskeletal:        General: Tenderness present. No swelling.     Cervical back: Neck supple.     Right lower leg: No edema.     Left lower leg: No edema.  Skin:    General: Skin is warm and dry.     Capillary Refill: Capillary refill takes less than 2 seconds.     Findings: Bruising (Overlying greater trochanter on right side) present.  Neurological:     General: No focal deficit present.     Mental Status: She is alert and oriented to person, place, and time.  Psychiatric:        Mood and Affect: Mood normal.     ED Results / Procedures / Treatments   Labs (all labs ordered are listed, but only abnormal results are displayed) Labs Reviewed - No data to display  EKG None  Radiology US Venous Img Lower Right (DVT Study)  Result Date: 09/14/2022 CLINICAL DATA:  Right hip pain and bruising EXAM: RIGHT LOWER EXTREMITY VENOUS DOPPLER ULTRASOUND TECHNIQUE: Gray-scale sonography with compression, as well as color and duplex ultrasound, were performed to evaluate the deep venous system(s) from the level of the common femoral vein through the popliteal and proximal calf veins. COMPARISON:  None Available. FINDINGS: VENOUS Normal compressibility of the common femoral, superficial femoral, and popliteal veins, as well as the visualized calf veins. Visualized portions of profunda femoral vein and great saphenous vein unremarkable. No filling defects to suggest DVT on grayscale or color Doppler imaging. Doppler  waveforms show normal direction of venous flow, normal respiratory plasticity and response to augmentation. Limited views of the contralateral common femoral vein are unremarkable. OTHER None. Limitations: none IMPRESSION: Negative. Electronically Signed   By: Darrin Nipper M.D.   On: 09/14/2022 18:12   DG Hip Unilat With Pelvis 2-3 Views Right  Result Date: 09/14/2022 CLINICAL DATA:  Pain EXAM: DG HIP (WITH OR WITHOUT PELVIS) 3V RIGHT COMPARISON:  None Available. FINDINGS: There is no evidence of hip fracture or dislocation. There is no evidence of arthropathy or other focal bone abnormality. IMPRESSION: Negative. Electronically Signed   By: Vonna Kotyk  Pleasure M.D.   On: 09/14/2022 18:10    Procedures Procedures    Medications Ordered in ED Medications - No data to display  ED Course/ Medical Decision Making/ A&P                             Medical Decision Making Amount and/or Complexity of Data Reviewed Radiology: ordered.  This patient presents to the ED for concern of right hip pain, this involves an extensive number of treatment options, and is a complaint that carries with it a high risk of complications and morbidity.  The differential diagnosis includes contusion, fracture, strain, sprain, dislocation, DVT  Co morbidities that complicate the patient evaluation   paroxysmal A-fib currently on Eliquis, anxiety, hypertension, GERD, sleep apnea, anemia, history of blood clots  My initial workup includes x-ray right hip, DVT study  Additional history obtained from: Nursing notes from this visit.  I ordered imaging studies including x-ray right hip, DVT study I independently visualized and interpreted imaging which showed unremarkable.  No osseous abnormalities or DVT I agree with the radiologist interpretation  Afebrile, hypertensive but otherwise hemodynamically stable.  56 year old female presents ED for evaluation of right hip pain and bruising.  She believes this is atraumatic.   She does take Eliquis for paroxysmal A-fib.  Does have a recent history of prolonged travel.  DVT study was negative.  Her neurovascular status is intact.  X-ray also negative.  The bruising is soft and swelling is difficult to appreciate due to body habitus.  Low suspicion for active extravasation.  Patient ambulated in the ED without difficulty.  She denies chest pain or shortness of breath.  Patient was encouraged to follow-up with her primary care provider for reevaluation.  She was encouraged to return to the ED if her symptoms worsen.  Shared decision making conversation was had with patient regarding labs.  Patient declined this today.  I believe this is reasonable as the pain is her only symptom.  She was given return precautions.  Stable at discharge.  At this time there does not appear to be any evidence of an acute emergency medical condition and the patient appears stable for discharge with appropriate outpatient follow up. Diagnosis was discussed with patient who verbalizes understanding of care plan and is agreeable to discharge. I have discussed return precautions with patient who verbalizes understanding. Patient encouraged to follow-up with their PCP within 1 week. All questions answered.  Note: Portions of this report may have been transcribed using voice recognition software. Every effort was made to ensure accuracy; however, inadvertent computerized transcription errors may still be present.         Final Clinical Impression(s) / ED Diagnoses Final diagnoses:  Pain of right hip    Rx / DC Orders ED Discharge Orders     None         Nehemiah Massed 09/14/22 1910    Regan Lemming, MD 09/15/22 1253

## 2022-09-14 NOTE — ED Triage Notes (Signed)
Pt takes eliquis for Afib and started feeling hot all over. Pt states when she woke today she had a bruise to her right hip area. Pt states she has severe pain in the area and feels like it is warm. No trauma that she can think of.

## 2022-09-14 NOTE — ED Provider Triage Note (Signed)
Emergency Medicine Provider Triage Evaluation Note  [name] , a 57 y.o. female  was evaluated in triage.  Pt complains of right hip pain.  States she noticed the pain this morning.  She does take Eliquis has history of injury.  She noticed bruising and swelling over the right hip as well.  Did travel recently to [place] via plane.  Denies lower extremity swelling, chest pain, shortness of breath.  Review of Systems  Positive: As above Negative: As above  Physical Exam  BP (!) 133/102 (BP Location: Right Arm)   Pulse 70   Temp 98 F (36.7 C) (Oral)   Resp 17   Ht '5\' 9"'$  (1.753 m)   Wt 107.5 kg   LMP 08/10/2005   SpO2 100%   BMI 35.00 kg/m  Gen:   Awake, no distress   Resp:  Normal effort  MSK:   Moves extremities without difficulty  Other:  Bruising over the greater trochanter on the right side.  Large amount of subcutaneous fat overlying the area  Medical Decision Making  Medically screening exam initiated at 5:07 PM.  Appropriate orders placed.  TABETHA HARAWAY was informed that the remainder of the evaluation will be completed by another provider, this initial triage assessment does not replace that evaluation, and the importance of remaining in the ED until their evaluation is complete.  X-ray right hip, DVT study ordered   Roylene Reason, Hershal Coria 09/14/22 1707

## 2022-09-14 NOTE — Discharge Instructions (Addendum)
You have been seen today for your complaint of right hip pain. Your imaging was reassuring and showed abnormalities. Your discharge medications include Tylenol.  You may take up to 1000 mg of Tylenol every 8 hours for pain. Home care instructions are as follows:  Ice the affected area for 15 minutes at a time multiple times throughout the day Follow up with: Your primary care provider this week for reevaluation Please seek immediate medical care if you develop any of the following symptoms: You fall. You have a sudden increase in pain and swelling in your hip. Your hip is red or swollen or very tender to touch. At this time there does not appear to be the presence of an emergent medical condition, however there is always the potential for conditions to change. Please read and follow the below instructions.  Do not take your medicine if  develop an itchy rash, swelling in your mouth or lips, or difficulty breathing; call 911 and seek immediate emergency medical attention if this occurs.  You may review your lab tests and imaging results in their entirety on your MyChart account.  Please discuss all results of fully with your primary care provider and other specialist at your follow-up visit.  Note: Portions of this text may have been transcribed using voice recognition software. Every effort was made to ensure accuracy; however, inadvertent computerized transcription errors may still be present.

## 2022-11-18 ENCOUNTER — Encounter: Payer: Self-pay | Admitting: Cardiology

## 2022-11-18 ENCOUNTER — Ambulatory Visit: Payer: No Typology Code available for payment source | Admitting: Cardiology

## 2022-11-18 VITALS — BP 118/72 | HR 62 | Ht 69.0 in | Wt 240.0 lb

## 2022-11-18 DIAGNOSIS — I10 Essential (primary) hypertension: Secondary | ICD-10-CM

## 2022-11-18 DIAGNOSIS — I480 Paroxysmal atrial fibrillation: Secondary | ICD-10-CM

## 2022-11-18 DIAGNOSIS — T50905A Adverse effect of unspecified drugs, medicaments and biological substances, initial encounter: Secondary | ICD-10-CM

## 2022-11-18 MED ORDER — VALSARTAN 160 MG PO TABS: 160.0000 mg | ORAL_TABLET | Freq: Every day | ORAL | 3 refills | Status: DC

## 2022-11-18 MED ORDER — SPIRONOLACTONE 25 MG PO TABS: 25.0000 mg | ORAL_TABLET | Freq: Every day | ORAL | 3 refills | Status: DC

## 2022-11-18 MED ORDER — DILTIAZEM HCL ER COATED BEADS 180 MG PO CP24: 180.0000 mg | ORAL_CAPSULE | Freq: Every day | ORAL | 3 refills | Status: DC

## 2022-11-18 MED ORDER — METOPROLOL SUCCINATE ER 50 MG PO TB24: 50.0000 mg | ORAL_TABLET | Freq: Every day | ORAL | 3 refills | Status: DC

## 2022-11-18 MED ORDER — FLECAINIDE ACETATE 150 MG PO TABS
150.0000 mg | ORAL_TABLET | Freq: Two times a day (BID) | ORAL | 3 refills | Status: DC
Start: 2022-11-18 — End: 2023-12-08

## 2022-11-18 MED ORDER — APIXABAN 5 MG PO TABS
5.0000 mg | ORAL_TABLET | Freq: Two times a day (BID) | ORAL | 3 refills | Status: DC
Start: 2022-11-18 — End: 2024-02-02

## 2022-11-18 MED ORDER — ONDANSETRON HCL 4 MG PO TABS: 4.0000 mg | ORAL_TABLET | Freq: Three times a day (TID) | ORAL | 0 refills | Status: DC | PRN

## 2022-11-18 NOTE — Progress Notes (Signed)
Primary Physician/Referring:  Lewis Moccasin, MD  [name], female    DOB: 1965-09-13, 57 y.o.   MRN: [id number]  Chief Complaint  Patient presents with   Paroxysmal atrial fibrillation [facility])   Medical Clearance   HPI:    [name]  is a 57 y.o.  African-American female with morbid obesity, history of gastric banding in 2011, due to failure of the same, underwent gastric bypass surgery in Sept 2018, paroxysmal atrial fibrillation no known recurrence since initiation of flecainide and presently on Eliquis, hypertension, morbid obesity, OSA on CPAP, after patient went on GLP-1 agonist, she has lost weight and repeat sleep study revealed no significant sleep apnea hence presently not on CPAP.  Patient presents for annual visit and follow-up of hypertension.  Except for occasional episodes of breakthrough atrial fibrillation, patient remains asymptomatic.  She is tolerating all her medications well.  Patient states that recently due to stress, she was drinking much more than usual, hence she was started on Depakote recently.  Since then she has noticed some nausea but has remained abstinent from alcohol.  She has been compliant with her CPAP.   Past Medical History:  Diagnosis Date   Adenomyosis 7/07   Anemia    Anxiety    Arthritis    Atrial fibrillation    Compartment syndrome of lower extremity 08/13/2014   Depression    Dysrhythmia    GERD (gastroesophageal reflux disease)    H/O blood clots    superficial blood clots with OCPs   Hypertension    Insomnia    Lower extremity edema    Sinus congestion    saw ENT, had nasal surgery to clean out nasal area for bacteria, mold   Sleep apnea    Uterine fibroid    Vitamin D deficiency    Past Surgical History:  Procedure Laterality Date   ABDOMINAL HYSTERECTOMY     BREAST BIOPSY     x2   CESAREAN SECTION     ESOPHAGOGASTRODUODENOSCOPY N/A 06/15/2016   Procedure: ESOPHAGOGASTRODUODENOSCOPY (EGD);  Surgeon:  Ovidio Kin, MD;  Location: [facility];  Service: General;  Laterality: N/A;   ESOPHAGOGASTRODUODENOSCOPY (EGD) WITH PROPOFOL N/A 09/20/2018   Procedure: ESOPHAGOGASTRODUODENOSCOPY (EGD) WITH PROPOFOL;  Surgeon: Jeani Hawking, MD;  Location[facility] END[facility]COPY;  Service: Endoscopy;  Laterality: N/A;   LAPAROSCOPIC GASTRIC BANDING  2010   LAPAROSCOPIC GASTRIC BANDING  2011   LAPAROSCOPY     x2   LAPAROSCOPY N/A 09/21/2018   Procedure: LAPAROSCOPY DIAGNOSTIC;  Surgeon: Gaynelle Adu, MD;  Location[facility] OR;  Service: General;  Laterality: N/A;   MOUTH SURGERY     NASAL SINUS SURGERY  4/13   PARTIAL KNEE ARTHROPLASTY Right 06/26/2016   Procedure: RIGHT UNICOMPARTMENTAL KNEE CODYLE AND PLATEAU MEDIAL COMPARTMENT;  Surgeon: Sheral Apley, MD;  Location: [facility];  Service: Orthopedics;  Laterality: Right;   ROUX-EN-Y GASTRIC BYPASS  2018   Family History  Problem Relation Age of Onset   Diabetes Mother    Hypertension Mother    Hyperlipidemia Mother    Heart disease Mother    Depression Mother    Cancer Father        prostate and lung   Diabetes Father    Hypertension Father    Alcoholism Father    Hypertension Brother        x2   Sleep apnea Brother    Hypothyroidism Brother    Hypothyroidism Maternal Grandmother  Ovarian cancer Paternal Grandmother    Social History   Tobacco Use   Smoking status: Never   Smokeless tobacco: Never  Substance Use Topics   Alcohol use: Yes    Alcohol/week: 3.0 - 4.0 standard drinks of alcohol    Types: 3 - 4 Standard drinks or equivalent per week    Comment: 3-4 days a week  Marital Status: Divorced  ROS  Review of Systems  Cardiovascular:  Negative for chest pain, dyspnea on exertion and leg swelling.    Objective      11/18/2022    3:47 PM 09/14/2022    5:05 PM 09/14/2022    5:00 PM  Vitals with BMI  Height 5\' 9"   5\' 9"   Weight 240 lbs  237 lbs  BMI 35.43  34.98  Systolic 118 133   Diastolic 72 102   Pulse 62 70      Blood pressure 118/72, pulse 62, height 5\' 9"  (1.753 m), weight 240 lb (108.9 kg), last menstrual period 08/10/2005, SpO2 96 %. Body mass index is 35.44 kg/m.   Physical Exam Neck:     Vascular: No carotid bruit or JVD.  Cardiovascular:     Rate and Rhythm: Normal rate and regular rhythm.     Pulses: Intact distal pulses.     Heart sounds: Normal heart sounds. No murmur heard.    No gallop.  Pulmonary:     Effort: Pulmonary effort is normal.     Breath sounds: Normal breath sounds.  Abdominal:     General: Bowel sounds are normal.     Palpations: Abdomen is soft.  Musculoskeletal:     Right lower leg: No edema.     Left lower leg: No edema.    Laboratory examination:   External labs: 12/27/2020: Serum glucose 1 1 mg, serum uric acid 6.1, BUN 13, creatinine 0.94, EGFR >60 mL, potassium 4.1.  Alkaline phosphatase minimally elevated at 123 (44-121).  LDH normal. Total cholesterol 151, triglycerides 79, HDL 65, LDL 71. Hb 13.0/HCT 40.4, platelets 318. A1c 5.5%.  TSH normal.  Vitamin D32.3.  C-reactive protein 1.27.  05/10/2020: Sodium 139, potassium 4.2, BUN 12, creatinine 0.92, EGFR >60 mL Total cholesterol 169, triglycerides 77, HDL 72, LDL 82. Vitamin D 21.9. TSH 0.79, A1c 5.2%. Hb 12.8/HCT 40.8, platelets 358, normal indicis.  01/01/2020: RBC 4.06, hemoglobin 12.2, hematocrit 36.1, CBC otherwise normal  09/01/2019: HDL 92, triglycerides 64, total cholesterol 168, calculated LDL 63.2  Radiology:  No results found.  Cardiac Studies:   Lexiscan Sestamibi stress test 05/11/2014: 1. The resting electrocardiogram demonstrated normal sinus rhythm and normal resting conduction.  The stress electrocardiogram was non-diagnostic due to low heart rate.  pharmacologic stress test. 2. The perfusion imaging study demonstrates the left ventricle to be mildly dilated with a left ventricular end-diastolic volume of 143 mL.  Otherwise there is no evidence of ischemia, normal wall  motion, LVEF 55%.  This represents a low risk study.  Echocardiogram 05/09/2014: Left ventricle cavity is normal in size. Mild concentric hypertrophy of the left ventricle. Normal global wall motion. Normal diastolic filling pattern. Calculated EF 64%. Insignificant pericardial effusion with clear fluids.  Sleep study 06/13/2014: Moderate obstructive sleep apnea, successfully treated with CPAP. Dr. Frances Furbish   EKG   EKG 11/18/2022: Normal sinus rhythm at the rate of 62 bpm, normal EKG.  Compared to 01/21/2022, no significant change.  EKG 05/03/2014: Atypical Atrial flutter with variable ventricular response, 110 bpm.  Normal axis, borderline low voltage complexes, nonspecific  T abnormality.  Allergies   Allergies  Allergen Reactions   Flagyl [Metronidazole] Anaphylaxis   Levaquin [Levofloxacin] Other (See Comments)    Pains and aches throughout body, pt states cant move well    Medications    Current Outpatient Medications:    ALPRAZolam (XANAX) 0.5 MG tablet, SMARTSIG:1-2 Tablet(s) By Mouth 1 to 2 Times Daily PRN, Disp: , Rfl:    escitalopram (LEXAPRO) 20 MG tablet, Take 20 mg by mouth daily., Disp: , Rfl:    Melatonin 10 MG CAPS, Take 1 capsule by mouth daily., Disp: , Rfl:    Multiple Vitamins-Minerals (MULTIVITAMIN WITH MINERALS) tablet, Take 1 tablet by mouth daily., Disp: , Rfl:    naltrexone (DEPADE) 50 MG tablet, Take 50 mg by mouth daily., Disp: , Rfl:    ondansetron (ZOFRAN) 4 MG tablet, Take 1 tablet (4 mg total) by mouth every 8 (eight) hours as needed for nausea or vomiting., Disp: 90 tablet, Rfl: 0   Semaglutide-Weight Management (WEGOVY) 2.4 MG/0.75ML SOAJ, Inject 2.4 mg into the skin., Disp: , Rfl:    apixaban (ELIQUIS) 5 MG TABS tablet, Take 1 tablet (5 mg total) by mouth 2 (two) times daily., Disp: 180 tablet, Rfl: 3   diltiazem (CARDIZEM CD) 180 MG 24 hr capsule, Take 1 capsule (180 mg total) by mouth daily., Disp: 90 capsule, Rfl: 3   flecainide (TAMBOCOR) 150 MG tablet,  Take 1 tablet (150 mg total) by mouth 2 (two) times daily., Disp: 180 tablet, Rfl: 3   metoprolol succinate (TOPROL-XL) 50 MG 24 hr tablet, Take 1 tablet (50 mg total) by mouth daily. Take with or immediately following a meal., Disp: 90 tablet, Rfl: 3   spironolactone (ALDACTONE) 25 MG tablet, Take 1 tablet (25 mg total) by mouth daily., Disp: 90 tablet, Rfl: 3   valsartan (DIOVAN) 160 MG tablet, Take 1 tablet (160 mg total) by mouth daily., Disp: 90 tablet, Rfl: 3   Assessment     ICD-10-CM   1. Paroxysmal atrial fibrillation (HCC)  I48.0 EKG 12-Lead    apixaban (ELIQUIS) 5 MG TABS tablet    flecainide (TAMBOCOR) 150 MG tablet    metoprolol succinate (TOPROL-XL) 50 MG 24 hr tablet    2. Essential hypertension  I10 spironolactone (ALDACTONE) 25 MG tablet    valsartan (DIOVAN) 160 MG tablet    3. Drug-induced nausea and vomiting  R11.2 ondansetron (ZOFRAN) 4 MG tablet   T50.905A      Meds ordered this encounter  Medications   ondansetron (ZOFRAN) 4 MG tablet    Sig: Take 1 tablet (4 mg total) by mouth every 8 (eight) hours as needed for nausea or vomiting.    Dispense:  90 tablet    Refill:  0   apixaban (ELIQUIS) 5 MG TABS tablet    Sig: Take 1 tablet (5 mg total) by mouth 2 (two) times daily.    Dispense:  180 tablet    Refill:  3   diltiazem (CARDIZEM CD) 180 MG 24 hr capsule    Sig: Take 1 capsule (180 mg total) by mouth daily.    Dispense:  90 capsule    Refill:  3   flecainide (TAMBOCOR) 150 MG tablet    Sig: Take 1 tablet (150 mg total) by mouth 2 (two) times daily.    Dispense:  180 tablet    Refill:  3   metoprolol succinate (TOPROL-XL) 50 MG 24 hr tablet    Sig: Take 1 tablet (50 mg total) by mouth  daily. Take with or immediately following a meal.    Dispense:  90 tablet    Refill:  3   spironolactone (ALDACTONE) 25 MG tablet    Sig: Take 1 tablet (25 mg total) by mouth daily.    Dispense:  90 tablet    Refill:  3   valsartan (DIOVAN) 160 MG tablet    Sig: Take  1 tablet (160 mg total) by mouth daily.    Dispense:  90 tablet    Refill:  3   Medications Discontinued During This Encounter  Medication Reason   busPIRone (BUSPAR) 5 MG tablet Patient Preference   escitalopram (LEXAPRO) 20 MG tablet Duplicate   montelukast (SINGULAIR) 10 MG tablet    Semaglutide,0.25 or 0.5MG /DOS, (OZEMPIC, 0.25 OR 0.5 MG/DOSE,) 2 MG/1.5ML SOPN    apixaban (ELIQUIS) 5 MG TABS tablet Reorder   diltiazem (CARDIZEM CD) 180 MG 24 hr capsule Reorder   flecainide (TAMBOCOR) 150 MG tablet Reorder   spironolactone (ALDACTONE) 25 MG tablet Reorder   valsartan (DIOVAN) 160 MG tablet Reorder   metoprolol succinate (TOPROL-XL) 50 MG 24 hr tablet Reorder    This patients CHA2DS2-VASc Score 2 (HTN, F) and yearly risk of stroke 2.2%.   Recommendations:   [name]  is a 57 y.o. African-American female with morbid obesity, history of gastric banding in 2011, due to failure of the same, underwent gastric bypass surgery in Sept 2018, paroxysmal atrial fibrillation, hypertension, morbid obesity, OSA on CPAP, since she went on GLP-1 agonist, she has lost significant amount of weight and has had resolution of OSA and presently not on CPAP.  1. Paroxysmal atrial fibrillation [facility]) Patient has occasional short breakthrough atrial fibrillation.  She is presently on anticoagulation although her chads vascular score is only 2.0 per her preference.  She has occasional ecchymosis related to this but no other significant bleeding issues.  - EKG 12-Lead - apixaban (ELIQUIS) 5 MG TABS tablet; Take 1 tablet (5 mg total) by mouth 2 (two) times daily.  Dispense: 180 tablet; Refill: 3 - flecainide (TAMBOCOR) 150 MG tablet; Take 1 tablet (150 mg total) by mouth 2 (two) times daily.  Dispense: 180 tablet; Refill: 3 - metoprolol succinate (TOPROL-XL) 50 MG 24 hr tablet; Take 1 tablet (50 mg total) by mouth daily. Take with or immediately following a meal.  Dispense: 90 tablet; Refill: 3  2.  Essential hypertension Blood pressure is under excellent control.  I did not make any changes to her medication, refilled the prescription.  Patient states that all her lab work are within normal limits.  Renal function is being monitored closely by Dr. Maryelizabeth Rowan.  Patient will also upload her labs to our portal.  - spironolactone (ALDACTONE) 25 MG tablet; Take 1 tablet (25 mg total) by mouth daily.  Dispense: 90 tablet; Refill: 3 - valsartan (DIOVAN) 160 MG tablet; Take 1 tablet (160 mg total) by mouth daily.  Dispense: 90 tablet; Refill: 3  3. Drug-induced nausea and vomiting Patient was recently started on naltrexone as due to recent stress patient has been drinking excessive amount of champagne, since then she has noticed severe nausea.  She plans to remain abstinent from alcohol.  I prescribed her Zofran for her nausea.  - ondansetron (ZOFRAN) 4 MG tablet; Take 1 tablet (4 mg total) by mouth every 8 (eight) hours as needed for nausea or vomiting.  Dispense: 90 tablet; Refill: 0  Weight loss again discussed with the patient, she is presently on Wegovy and  has lost some weight, encouraged her to continue to stick to her diet.  I will see her back in a year or sooner if problems.   Yates DecampJay Aviv Rota, MD, [facility] Medical CenterFACC 11/18/2022, 4:30 PM Office: [phone] Fax: [phone] Pager: [phone]

## 2022-11-19 NOTE — Telephone Encounter (Signed)
From patient.

## 2022-11-19 NOTE — Telephone Encounter (Signed)
Labs 05/20/2022:  Hb 12.8/HCT 38.1, platelets 317.  Total cholesterol 173, triglycerides 70, HDL 78, LDL 81.  Serum glucose 90 mg, BUN 11, creatinine 0.9, EGFR 74 mL.  LFTs normal.  Vitamin D39.4.

## 2022-11-23 ENCOUNTER — Encounter: Payer: Self-pay | Admitting: Cardiology

## 2023-01-22 ENCOUNTER — Ambulatory Visit: Payer: No Typology Code available for payment source | Admitting: Student

## 2023-03-11 ENCOUNTER — Other Ambulatory Visit (HOSPITAL_COMMUNITY): Payer: Self-pay

## 2023-03-11 MED ORDER — WEGOVY 2.4 MG/0.75ML ~~LOC~~ SOAJ
2.4000 mg | SUBCUTANEOUS | 3 refills | Status: DC
  Filled 2023-03-11 – 2023-03-26 (×2): qty 3, 28d supply, fill #0
  Filled 2023-05-14: qty 3, 28d supply, fill #1
  Filled 2023-07-12: qty 3, 28d supply, fill #2
  Filled 2023-08-27: qty 3, 28d supply, fill #3
  Filled 2023-09-14 – 2023-09-24 (×3): qty 3, 28d supply, fill #4
  Filled 2023-10-20: qty 3, 28d supply, fill #5

## 2023-03-12 ENCOUNTER — Other Ambulatory Visit (HOSPITAL_COMMUNITY): Payer: Self-pay

## 2023-03-26 ENCOUNTER — Other Ambulatory Visit (HOSPITAL_COMMUNITY): Payer: Self-pay

## 2023-04-09 ENCOUNTER — Other Ambulatory Visit: Payer: Self-pay | Admitting: Nurse Practitioner

## 2023-04-09 DIAGNOSIS — N390 Urinary tract infection, site not specified: Secondary | ICD-10-CM

## 2023-04-09 DIAGNOSIS — R82998 Other abnormal findings in urine: Secondary | ICD-10-CM

## 2023-04-09 DIAGNOSIS — R102 Pelvic and perineal pain: Secondary | ICD-10-CM

## 2023-04-12 ENCOUNTER — Other Ambulatory Visit: Payer: Self-pay | Admitting: Cardiology

## 2023-04-12 DIAGNOSIS — I480 Paroxysmal atrial fibrillation: Secondary | ICD-10-CM

## 2023-04-14 ENCOUNTER — Ambulatory Visit
Admission: RE | Admit: 2023-04-14 | Discharge: 2023-04-14 | Disposition: A | Discharge status: Home / Self Care | Payer: No Typology Code available for payment source | Source: Ambulatory Visit | Attending: Nurse Practitioner | Admitting: Nurse Practitioner

## 2023-04-14 DIAGNOSIS — R82998 Other abnormal findings in urine: Secondary | ICD-10-CM

## 2023-04-14 DIAGNOSIS — N390 Urinary tract infection, site not specified: Secondary | ICD-10-CM

## 2023-04-14 DIAGNOSIS — R102 Pelvic and perineal pain: Secondary | ICD-10-CM

## 2023-05-14 ENCOUNTER — Other Ambulatory Visit (HOSPITAL_COMMUNITY): Payer: Self-pay

## 2023-05-16 ENCOUNTER — Other Ambulatory Visit: Payer: Self-pay | Admitting: Cardiology

## 2023-05-16 DIAGNOSIS — R112 Nausea with vomiting, unspecified: Secondary | ICD-10-CM

## 2023-07-12 ENCOUNTER — Other Ambulatory Visit (HOSPITAL_COMMUNITY): Payer: Self-pay

## 2023-07-14 ENCOUNTER — Telehealth: Payer: Self-pay | Admitting: Cardiology

## 2023-07-14 ENCOUNTER — Other Ambulatory Visit: Payer: Self-pay

## 2023-07-14 DIAGNOSIS — I480 Paroxysmal atrial fibrillation: Secondary | ICD-10-CM

## 2023-07-14 MED ORDER — METOPROLOL SUCCINATE ER 50 MG PO TB24
50.0000 mg | ORAL_TABLET | Freq: Every day | ORAL | 1 refills | Status: DC
Start: 2023-07-14 — End: 2023-12-08

## 2023-07-14 NOTE — Telephone Encounter (Signed)
*  STAT* If patient is at the pharmacy, call can be transferred to refill team.   1. Which medications need to be refilled? (please list name of each medication and dose if known) metoprolol succinate (TOPROL-XL) 50 MG 24 hr tablet   2. Which pharmacy/location (including street and city if local pharmacy) is medication to be sent to? [facility] - [place], [place] - [address] [facility] [address] Portal to Reg[facility] Phone: [phone]  Fax: [phone]     3. Do they need a 30 day or 90 day supply? 90

## 2023-07-14 NOTE — Telephone Encounter (Signed)
 RX sent to requested Pharmacy

## 2023-08-27 ENCOUNTER — Other Ambulatory Visit (HOSPITAL_COMMUNITY): Payer: Self-pay

## 2023-09-14 ENCOUNTER — Other Ambulatory Visit (HOSPITAL_COMMUNITY): Payer: Self-pay

## 2023-09-24 ENCOUNTER — Other Ambulatory Visit (HOSPITAL_COMMUNITY): Payer: Self-pay

## 2023-09-24 ENCOUNTER — Other Ambulatory Visit: Payer: Self-pay

## 2023-10-20 ENCOUNTER — Other Ambulatory Visit (HOSPITAL_COMMUNITY): Payer: Self-pay

## 2023-10-21 ENCOUNTER — Emergency Department (HOSPITAL_BASED_OUTPATIENT_CLINIC_OR_DEPARTMENT_OTHER)
Admission: EM | Admit: 2023-10-21 | Discharge: 2023-10-21 | Discharge status: Against Medical Advice | Attending: Emergency Medicine | Admitting: Emergency Medicine

## 2023-10-21 ENCOUNTER — Emergency Department (HOSPITAL_BASED_OUTPATIENT_CLINIC_OR_DEPARTMENT_OTHER)

## 2023-10-21 ENCOUNTER — Encounter (HOSPITAL_BASED_OUTPATIENT_CLINIC_OR_DEPARTMENT_OTHER): Payer: Self-pay | Admitting: *Deleted

## 2023-10-21 ENCOUNTER — Other Ambulatory Visit: Payer: Self-pay

## 2023-10-21 DIAGNOSIS — S0990XA Unspecified injury of head, initial encounter: Secondary | ICD-10-CM | POA: Diagnosis present

## 2023-10-21 DIAGNOSIS — I4891 Unspecified atrial fibrillation: Secondary | ICD-10-CM | POA: Diagnosis not present

## 2023-10-21 DIAGNOSIS — W01198A Fall on same level from slipping, tripping and stumbling with subsequent striking against other object, initial encounter: Secondary | ICD-10-CM | POA: Diagnosis not present

## 2023-10-21 DIAGNOSIS — Z794 Long term (current) use of insulin: Secondary | ICD-10-CM | POA: Diagnosis not present

## 2023-10-21 DIAGNOSIS — S0083XA Contusion of other part of head, initial encounter: Secondary | ICD-10-CM | POA: Diagnosis not present

## 2023-10-21 DIAGNOSIS — Z7901 Long term (current) use of anticoagulants: Secondary | ICD-10-CM | POA: Insufficient documentation

## 2023-10-21 DIAGNOSIS — Y92002 Bathroom of unspecified non-institutional (private) residence single-family (private) house as the place of occurrence of the external cause: Secondary | ICD-10-CM | POA: Insufficient documentation

## 2023-10-21 MED ORDER — ACETAMINOPHEN 500 MG PO TABS
1000.0000 mg | ORAL_TABLET | Freq: Once | ORAL | Status: AC
  Administered 2023-10-21: 1000 mg via ORAL
  Filled 2023-10-21: qty 2

## 2023-10-21 NOTE — ED Notes (Signed)
 Patient up to nurses' station yelling at nursing staff. She was unhappy that no one has checked on her after she called out on the bell. She "I assume that I am not bleeding in my head." I informed her that the radiology reads were not resulted and she replied that she has been waiting for over an hour. Tori reports that she had answered the call bell and messaged John PA to go see patient. Patient left. Awake and alert. Gait steady. No slurred speech.

## 2023-10-21 NOTE — ED Triage Notes (Signed)
 Pt is on Eliquis for afib and slipped in oil and fell and stuck her head and face last pm.  No LOC.  Pt is alert and oriented.

## 2023-10-21 NOTE — ED Provider Notes (Signed)
  EMERGENCY DEPARTMENT AT [facility] Provider Note   CSN: [id number] Arrival date & time: 10/21/23  1344     History  Chief Complaint  Patient presents with  . Fall   HPI [name] is a 58 y.o. female with a history of A-fib on Eliquis presenting for fall and head injury.  Occurred last night.  States she slipped on some oil in her bathroom she fell forward and hit her face on the bathroom wall and then fell to the ground hitting her head again on the ground.  Denies LOC.  Denies syncope.  States she was seen earlier today by urgent care and because she is on Eliquis they advised her to come here immediately for further evaluation.  She denies dizziness, visual disturbance, weakness or numbness in her extremities.  Denies any neck pain.  Denies back pain, chest pain or abdominal pain.  Endorses bruise about the left eye.   Fall       Home Medications Prior to Admission medications   Medication Sig Start Date End Date Taking? Authorizing Provider  doxycycline (VIBRAMYCIN) 50 MG capsule Take by mouth. 10/15/23  Yes [provider]  ALPRAZolam Prudy Feeler) 0.5 MG tablet SMARTSIG:1-2 Tablet(s) By Mouth 1 to 2 Times Daily PRN 08/01/19   [provider]  apixaban (ELIQUIS) 5 MG TABS tablet Take 1 tablet (5 mg total) by mouth 2 (two) times daily. 11/18/22   Yates Decamp, MD  diltiazem (CARDIZEM CD) 180 MG 24 hr capsule Take 1 capsule (180 mg total) by mouth daily. 11/18/22   Yates Decamp, MD  escitalopram (LEXAPRO) 20 MG tablet Take 20 mg by mouth daily.    [provider]  flecainide (TAMBOCOR) 150 MG tablet Take 1 tablet (150 mg total) by mouth 2 (two) times daily. 11/18/22   Yates Decamp, MD  Melatonin 10 MG CAPS Take 1 capsule by mouth daily.    [provider]  metoprolol succinate (TOPROL-XL) 50 MG 24 hr tablet Take 1 tablet (50 mg total) by mouth daily. Take with or immediately following a meal. 07/14/23   Yates Decamp, MD  Multiple  Vitamins-Minerals (MULTIVITAMIN WITH MINERALS) tablet Take 1 tablet by mouth daily.    [provider]  naltrexone (DEPADE) 50 MG tablet Take 50 mg by mouth daily.    [provider]  ondansetron (ZOFRAN) 4 MG tablet Take 1 tablet (4 mg total) by mouth every 8 (eight) hours as needed for nausea or vomiting. 11/18/22   Yates Decamp, MD  Semaglutide-Weight Management (WEGOVY) 2.4 MG/0.75ML SOAJ Inject 2.4 mg into the skin.    [provider]  Semaglutide-Weight Management (WEGOVY) 2.4 MG/0.75ML SOAJ Inject 2.4 mg into the skin once a week. 03/11/23     spironolactone (ALDACTONE) 25 MG tablet Take 1 tablet (25 mg total) by mouth daily. 11/18/22   Yates Decamp, MD  tamsulosin (FLOMAX) 0.4 MG CAPS capsule Take 0.4 mg by mouth daily.    [provider]  valsartan (DIOVAN) 160 MG tablet Take 1 tablet (160 mg total) by mouth daily. 11/18/22   Yates Decamp, MD      Allergies    Flagyl [metronidazole] and Levaquin [levofloxacin]    Review of Systems   See HPI   Physical Exam   Vitals:   10/21/23 1630 10/21/23 1700  BP: (!) 151/106 (!) 143/103  Pulse: 72 70  Resp: 10 17  Temp:    SpO2: 100% 100%    CONSTITUTIONAL:  well-appearing, NAD NEURO:  GCS 15. Speech is goal oriented. No deficits appreciated to CN III-XII; symmetric eyebrow raise, no facial drooping, tongue midline. Patient has equal grip strength bilaterally with 5/5 strength against resistance in all major muscle groups bilaterally. Sensation to light touch intact. Patient moves extremities without ataxia. Normal finger-nose-finger. Patient ambulatory with steady gait. EYES:  eyes equal and reactive, left periorbital erythema, no pain with movement Head: Atraumatic, no Battle sign and raccoon eyes or rhinorrhea ENT/NECK:  Supple, no stridor  CARDIO:  Regular rate and rhythm, appears well-perfused  PULM:  No respiratory distress, CTAB GI/GU:  non-distended, soft MSK/SPINE:  No gross deformities, no edema,  moves all extremities  SKIN:  no rash, atraumatic  *Additional and/or pertinent findings included in MDM below    ED Results / Procedures / Treatments   Labs (all labs ordered are listed, but only abnormal results are displayed) Labs Reviewed - No data to display  EKG None  Radiology No results found.  Procedures Procedures    Medications Ordered in ED Medications  acetaminophen (TYLENOL) tablet 1,000 mg (1,000 mg Oral Given 10/21/23 1432)    ED Course/ Medical Decision Making/ A&P Clinical Course as of 10/21/23 1937  Thu Oct 21, 2023  1937 Patient left AMA.  I just reviewed results of CT scans which revealed no acute abnormality. [JR]    Clinical Course User Index [JR] Gareth Eagle, PA-C                                 Medical Decision Making Amount and/or Complexity of Data Reviewed Radiology: ordered.  Risk OTC drugs.   58 year old well-appearing female presenting for fall and head injury.  Patient is on Eliquis.  Exam notable for bruising around the left eye but otherwise head was atraumatic.  DDx includes traumatic brain, skull, neck injury, orbital trauma, other.  Ordered CT max face, cervical spine and head.  Plan was to reassess after results of the images.  Unfortunately patient decided to leave AMA.        Final Clinical Impression(s) / ED Diagnoses Final diagnoses:  Injury of head, initial encounter    Rx / DC Orders ED Discharge Orders     None         Gareth Eagle, PA-C 10/21/23 1804    Melene Plan, DO 10/22/23 [id number]

## 2023-11-02 ENCOUNTER — Other Ambulatory Visit: Payer: Self-pay | Admitting: Cardiology

## 2023-11-02 DIAGNOSIS — I10 Essential (primary) hypertension: Secondary | ICD-10-CM

## 2023-11-18 ENCOUNTER — Ambulatory Visit: Payer: No Typology Code available for payment source | Admitting: Cardiology

## 2023-12-08 ENCOUNTER — Telehealth: Payer: Self-pay | Admitting: Cardiology

## 2023-12-08 DIAGNOSIS — I10 Essential (primary) hypertension: Secondary | ICD-10-CM

## 2023-12-08 DIAGNOSIS — I480 Paroxysmal atrial fibrillation: Secondary | ICD-10-CM

## 2023-12-08 MED ORDER — DILTIAZEM HCL ER COATED BEADS 180 MG PO CP24: 180.0000 mg | ORAL_CAPSULE | Freq: Every day | ORAL | 0 refills | Status: DC

## 2023-12-08 MED ORDER — METOPROLOL SUCCINATE ER 50 MG PO TB24: 50.0000 mg | ORAL_TABLET | Freq: Every day | ORAL | 0 refills | Status: DC

## 2023-12-08 MED ORDER — FLECAINIDE ACETATE 150 MG PO TABS: 150.0000 mg | ORAL_TABLET | Freq: Two times a day (BID) | ORAL | 0 refills | Status: DC

## 2023-12-08 MED ORDER — SPIRONOLACTONE 25 MG PO TABS
25.0000 mg | ORAL_TABLET | Freq: Every day | ORAL | 0 refills | Status: DC
Start: 2023-12-08 — End: 2024-02-02

## 2023-12-08 MED ORDER — VALSARTAN 160 MG PO TABS: 160.0000 mg | ORAL_TABLET | Freq: Every day | ORAL | 0 refills | Status: DC

## 2023-12-08 NOTE — Telephone Encounter (Signed)
 Pt's medications were sent to pt's pharmacy as requested. Confirmation received.

## 2023-12-08 NOTE — Telephone Encounter (Signed)
*  STAT* If patient is at the pharmacy, call can be transferred to refill team.   1. Which medications need to be refilled? (please list name of each medication and dose if known) diltiazem  (CARDIZEM  CD) 180 MG 24 hr capsule   flecainide  (TAMBOCOR ) 150 MG tablet  metoprolol  succinate (TOPROL -XL) 50 MG 24 hr tablet   spironolactone  (ALDACTONE ) 25 MG tablet  valsartan  (DIOVAN ) 160 MG tablet   2. Would you like to learn more about the convenience, safety, & potential cost savings by using the [facility]? No   3. Are you open to using the [facility] (Type [facility]. ) No   4. Which pharmacy/location (including street and city if local pharmacy) is medication to be sent to? [id number] [place] [place] - [address] AT [address] [facility] [address]    5. Do they need a 30 day or 90 day supply? 90 day  Pt has scheduled appt on 6/25

## 2023-12-12 ENCOUNTER — Emergency Department (HOSPITAL_COMMUNITY)
Admission: EM | Admit: 2023-12-12 | Discharge: 2023-12-12 | Disposition: A | Discharge status: Home / Self Care | Attending: Emergency Medicine | Admitting: Emergency Medicine

## 2023-12-12 ENCOUNTER — Encounter (HOSPITAL_COMMUNITY): Payer: Self-pay

## 2023-12-12 DIAGNOSIS — Z7901 Long term (current) use of anticoagulants: Secondary | ICD-10-CM | POA: Diagnosis not present

## 2023-12-12 DIAGNOSIS — I10 Essential (primary) hypertension: Secondary | ICD-10-CM | POA: Diagnosis not present

## 2023-12-12 DIAGNOSIS — M25511 Pain in right shoulder: Secondary | ICD-10-CM | POA: Diagnosis present

## 2023-12-12 DIAGNOSIS — R200 Anesthesia of skin: Secondary | ICD-10-CM | POA: Diagnosis not present

## 2023-12-12 DIAGNOSIS — Z79899 Other long term (current) drug therapy: Secondary | ICD-10-CM | POA: Diagnosis not present

## 2023-12-12 MED ORDER — KETOROLAC TROMETHAMINE 15 MG/ML IJ SOLN
15.0000 mg | Freq: Once | INTRAMUSCULAR | Status: AC
  Administered 2023-12-12: 15 mg via INTRAMUSCULAR
  Filled 2023-12-12: qty 1

## 2023-12-12 MED ORDER — METHOCARBAMOL 500 MG PO TABS: 500.0000 mg | ORAL_TABLET | Freq: Two times a day (BID) | ORAL | 0 refills | Status: DC

## 2023-12-12 NOTE — ED Provider Notes (Signed)
 [place] EMERGENCY DEPARTMENT AT [facility] Provider Note   CSN: [id number] Arrival date & time: 12/12/23  1037     History  Chief Complaint  Patient presents with   Shoulder Pain    [name] is a 58 y.o. female history of A-fib on Eliquis , hypertension, anxiety, arthritis presents with complaints of right shoulder pain.  Symptoms have been ongoing for the past couple weeks.  No injury or trauma.  She was evaluated at orthopedics and given a cortisone injection for what appeared to be arthritis.  Denies significant improvement.  Few days ago she started noticing numbness and tingling in her forearm and fingers.  Symptoms come and go.  No blurry vision, dizziness, lower extremity symptoms, difficulty ambulating or speaking.  She is compliant with her Eliquis .   Shoulder Pain     Past Medical History:  Diagnosis Date   Adenomyosis 7/07   Anemia    Anxiety    Arthritis    Atrial fibrillation (HCC)    Compartment syndrome of lower extremity (HCC) 08/13/2014   Depression    Dysrhythmia    GERD (gastroesophageal reflux disease)    H/O blood clots    superficial blood clots with OCPs   Hypertension    Insomnia    Lower extremity edema    Sinus congestion    saw ENT, had nasal surgery to clean out nasal area for bacteria, mold   Sleep apnea    Uterine fibroid    Vitamin D deficiency      Home Medications Prior to Admission medications   Medication Sig Start Date End Date Taking? Authorizing Provider  methocarbamol  (ROBAXIN ) 500 MG tablet Take 1 tablet (500 mg total) by mouth 2 (two) times daily. 12/12/23  Yes Felicie Horning, PA-C  ALPRAZolam  (XANAX ) 0.5 MG tablet SMARTSIG:1-2 Tablet(s) By Mouth 1 to 2 Times Daily PRN 08/01/19   [provider]  apixaban  (ELIQUIS ) 5 MG TABS tablet Take 1 tablet (5 mg total) by mouth 2 (two) times daily. 11/18/22   Knox Perl, MD  diltiazem  (CARDIZEM  CD) 180 MG 24 hr capsule Take 1 capsule (180 mg total) by mouth  daily. 12/08/23   Knox Perl, MD  doxycycline  (VIBRAMYCIN ) 50 MG capsule Take by mouth. 10/15/23   [provider]  escitalopram  (LEXAPRO ) 20 MG tablet Take 20 mg by mouth daily.    [provider]  flecainide  (TAMBOCOR ) 150 MG tablet Take 1 tablet (150 mg total) by mouth 2 (two) times daily. 12/08/23   Knox Perl, MD  Melatonin 10 MG CAPS Take 1 capsule by mouth daily.    [provider]  metoprolol  succinate (TOPROL -XL) 50 MG 24 hr tablet Take 1 tablet (50 mg total) by mouth daily. Take with or immediately following a meal. 12/08/23   Knox Perl, MD  Multiple Vitamins-Minerals (MULTIVITAMIN WITH MINERALS) tablet Take 1 tablet by mouth daily.    [provider]  naltrexone (DEPADE) 50 MG tablet Take 50 mg by mouth daily.    [provider]  ondansetron  (ZOFRAN ) 4 MG tablet Take 1 tablet (4 mg total) by mouth every 8 (eight) hours as needed for nausea or vomiting. 11/18/22   Knox Perl, MD  Semaglutide -Weight Management (WEGOVY ) 2.4 MG/0.75ML SOAJ Inject 2.4 mg into the skin.    [provider]  Semaglutide -Weight Management (WEGOVY ) 2.4 MG/0.75ML SOAJ Inject 2.4 mg into the skin once a week. 03/11/23     spironolactone  (ALDACTONE ) 25 MG tablet Take 1 tablet (25  mg total) by mouth daily. 12/08/23   Knox Perl, MD  tamsulosin  (FLOMAX ) 0.4 MG CAPS capsule Take 0.4 mg by mouth daily.    [provider]  valsartan  (DIOVAN ) 160 MG tablet Take 1 tablet (160 mg total) by mouth daily. 12/08/23   Knox Perl, MD      Allergies    Flagyl [metronidazole] and Levaquin  [levofloxacin ]    Review of Systems   Review of Systems  Musculoskeletal:  Positive for myalgias.    Physical Exam Updated Vital Signs BP 116/86 (BP Location: Left Arm)   Pulse 69   Temp 98.3 F (36.8 C) (Oral)   Resp 18   Ht 5\' 9"  (1.753 m)   Wt 108.9 kg   LMP 08/10/2005   SpO2 96%   BMI 35.45 kg/m  Physical Exam Vitals and nursing note reviewed.  Constitutional:       General: She is not in acute distress.    Appearance: She is well-developed.  HENT:     Head: Normocephalic and atraumatic.  Eyes:     Conjunctiva/sclera: Conjunctivae normal.  Cardiovascular:     Rate and Rhythm: Normal rate and regular rhythm.     Heart sounds: No murmur heard. Pulmonary:     Effort: Pulmonary effort is normal. No respiratory distress.     Breath sounds: Normal breath sounds.  Musculoskeletal:        General: No swelling.     Cervical back: Neck supple.     Comments: Generalized tenderness to right shoulder, does not tolerate full active range of motion of shoulder due to pain, tolerates full elbow range of motion, radial pulses 2+ and symmetric, motor function intact, positive Tinel's at both carpal and cubital tunnels, negative Spurling's, 5 out of 5 strength bilaterally  Skin:    General: Skin is warm and dry.     Capillary Refill: Capillary refill takes less than 2 seconds.  Neurological:     Mental Status: She is alert.  Psychiatric:        Mood and Affect: Mood normal.     ED Results / Procedures / Treatments   Labs (all labs ordered are listed, but only abnormal results are displayed) Labs Reviewed - No data to display  EKG None  Radiology No results found.  Procedures Procedures    Medications Ordered in ED Medications  ketorolac  (TORADOL ) 15 MG/ML injection 15 mg (has no administration in time range)    ED Course/ Medical Decision Making/ A&P                                 Medical Decision Making  This patient presents to the ED with chief complaint(s) of shoulder pain.  The complaint involves an extensive differential diagnosis and also carries with it a high risk of complications and morbidity.   Pertinent past medical history as listed in HPI  The differential diagnosis includes  Fracture, sprain, dislocation, carpal/cubital tunnel, CVA, TIA, septic joint, gout Additional history obtained: Records reviewed Care  Everywhere/External Records  Assessment and management:   Hemodynamically stable patient presenting with right shoulder pain and upper extremity numbness.  Pain has been ongoing for the past week.  Was evaluated by Ortho.  X-rays reportedly demonstrated some arthritic changes.  I do not have these images available today for my visit.  There was no injury or trauma.  Not suspect fracture or dislocation.  Do not feel that there is  any indication to repeat x-rays today.  On exam patient maintains her right upper extremity in a guarded position, does not tolerate full range of motion of the shoulder due to pain.  Patient is afebrile and there is no swelling, erythema or warmth over the shoulder.  No suspicion for septic joint, or gout.  Numbness and tingling is new over for started about a day or so ago and has been intermittent.  Send associated with any blurry vision, dizziness, difficulty ambulating, lower extremity numbness or tingling, difficulty speaking.  She does have A-fib but is compliant with her Eliquis .  She does have positive Tinel's at both carpal and cubital tunnels.  Numbness and tingling appears to be isolated to above and below.  Overall low suspicion for CVA/TIA.  Negative Spurling's.  Do not suspect cervical radiculopathy.  Overall symptoms are most consistent with musculoskeletal etiology such as adhesive capsulitis/arthritis/carpal/cubital tunnel.  Will send in muscle relaxers and have patient follow-up with orthopedics she would likely benefit from physical therapy.  Independent ECG interpretation:  none  Independent labs interpretation:  The following labs were independently interpreted:  none  Independent visualization and interpretation of imaging: I independently visualized the following imaging with scope of interpretation limited to determining acute life threatening conditions related to emergency care: none    Consultations obtained:   none  Disposition:   Patient will  be discharged home. The patient has been appropriately medically screened and/or stabilized in the ED. I have low suspicion for any other emergent medical condition which would require further screening, evaluation or treatment in the ED or require inpatient management. At time of discharge the patient is hemodynamically stable and in no acute distress. I have discussed work-up results and diagnosis with patient and answered all questions. Patient is agreeable with discharge plan. We discussed strict return precautions for returning to the emergency department and they verbalized understanding.     Social Determinants of Health:   none  This note was dictated with voice recognition software.  Despite best efforts at proofreading, errors may have occurred which can change the documentation meaning.          Final Clinical Impression(s) / ED Diagnoses Final diagnoses:  Acute pain of right shoulder    Rx / DC Orders ED Discharge Orders          Ordered    methocarbamol  (ROBAXIN ) 500 MG tablet  2 times daily        12/12/23 1220              Stanton Earthly 12/12/23 1250    Lind Repine, MD 12/12/23 [phone]

## 2023-12-12 NOTE — Discharge Instructions (Addendum)
 You were evaluated in the emergency room for right shoulder pain and numbness.  You are provided a splint to wear at night, which can help with carpal tunnel symptoms.  Additionally prescription for Robaxin , muscle relaxer was sent into your pharmacy.  Please avoid driving or operating heavy machinery while using this medication as may cause drowsiness.  You may additionally use Tylenol  and ibuprofen as needed for pain.  Please follow-up with a orthopedic doctor for further management.

## 2023-12-12 NOTE — ED Triage Notes (Signed)
 Pt arrived reporting R shoulder pain x1 week. States was told by ortho she has arthritis. Given a shot in office and prescribed steroids. Patient states pain is worse and tingling felt down right arm. Denies injury. No other symptoms reported.

## 2023-12-12 NOTE — ED Notes (Signed)
 The patient refused the wrist brace-stated, "I won't use it"

## 2023-12-14 ENCOUNTER — Other Ambulatory Visit: Payer: Self-pay | Admitting: Cardiology

## 2023-12-14 DIAGNOSIS — I480 Paroxysmal atrial fibrillation: Secondary | ICD-10-CM

## 2023-12-15 ENCOUNTER — Ambulatory Visit: Admitting: Cardiology

## 2024-02-02 ENCOUNTER — Encounter: Payer: Self-pay | Admitting: Cardiology

## 2024-02-02 ENCOUNTER — Other Ambulatory Visit (HOSPITAL_COMMUNITY): Payer: Self-pay

## 2024-02-02 ENCOUNTER — Ambulatory Visit: Attending: Cardiology | Admitting: Cardiology

## 2024-02-02 VITALS — BP 111/76 | HR 72 | Resp 16 | Ht 69.0 in | Wt 227.0 lb

## 2024-02-02 DIAGNOSIS — I10 Essential (primary) hypertension: Secondary | ICD-10-CM | POA: Diagnosis not present

## 2024-02-02 DIAGNOSIS — I480 Paroxysmal atrial fibrillation: Secondary | ICD-10-CM

## 2024-02-02 MED ORDER — VALSARTAN 160 MG PO TABS
160.0000 mg | ORAL_TABLET | Freq: Every day | ORAL | 3 refills | Status: AC
  Filled 2024-02-02: qty 30, 30d supply, fill #0

## 2024-02-02 MED ORDER — DILTIAZEM HCL ER COATED BEADS 180 MG PO CP24
180.0000 mg | ORAL_CAPSULE | Freq: Every day | ORAL | 3 refills | Status: AC
  Filled 2024-02-02: qty 30, 30d supply, fill #0

## 2024-02-02 MED ORDER — FLECAINIDE ACETATE 150 MG PO TABS
150.0000 mg | ORAL_TABLET | Freq: Two times a day (BID) | ORAL | 3 refills | Status: DC
  Filled 2024-02-02: qty 60, 30d supply, fill #0

## 2024-02-02 MED ORDER — APIXABAN 5 MG PO TABS
5.0000 mg | ORAL_TABLET | Freq: Two times a day (BID) | ORAL | 3 refills | Status: AC
  Filled 2024-02-02: qty 60, 30d supply, fill #0

## 2024-02-02 MED ORDER — METOPROLOL SUCCINATE ER 50 MG PO TB24
50.0000 mg | ORAL_TABLET | Freq: Every day | ORAL | 3 refills | Status: DC
  Filled 2024-02-02: qty 30, 30d supply, fill #0

## 2024-02-02 MED ORDER — SPIRONOLACTONE 25 MG PO TABS
25.0000 mg | ORAL_TABLET | Freq: Every day | ORAL | 3 refills | Status: AC
  Filled 2024-02-02: qty 30, 30d supply, fill #0

## 2024-02-02 NOTE — Patient Instructions (Signed)
 Medication Instructions:  Your physician recommends that you continue on your current medications as directed. Please refer to the Current Medication list given to you today.  *If you need a refill on your cardiac medications before your next appointment, please call your pharmacy*  Lab Work: none If you have labs (blood work) drawn today and your tests are completely normal, you will receive your results only by: MyChart Message (if you have MyChart) OR A paper copy in the mail If you have any lab test that is abnormal or we need to change your treatment, we will call you to review the results.  Testing/Procedures: none  Follow-Up: At [facility], you and your health needs are our priority.  As part of our continuing mission to provide you with exceptional heart care, our providers are all part of one team.  This team includes your primary Cardiologist (physician) and Advanced Practice Providers or APPs (Physician Assistants and Nurse Practitioners) who all work together to provide you with the care you need, when you need it.  Your next appointment:   As needed  Provider:   Gordy Bergamo, MD    We recommend signing up for the patient portal called MyChart.  Sign up information is provided on this After Visit Summary.  MyChart is used to connect with patients for Virtual Visits (Telemedicine).  Patients are able to view lab/test results, encounter notes, upcoming appointments, etc.  Non-urgent messages can be sent to your provider as well.   To learn more about what you can do with MyChart, go to [email or web address].   Other Instructions You have been referred to electophysiology

## 2024-02-02 NOTE — Progress Notes (Signed)
 Cardiology Office Note:  .   Date:  02/02/2024  ID:  [name], DOB 07/09/1966, MRN [id number] PCP: Waylan Almarie SAUNDERS, MD  [facility] Providers Cardiologist:  Gordy Bergamo, MD   History of Present Illness: .   [name] is a 58 y.o. African-American female with morbid obesity, history of gastric banding in 2011, due to failure of the same, underwent gastric bypass surgery in Sept 2018, paroxysmal atrial fibrillation no known recurrence since initiation of flecainide  and presently on Eliquis , hypertension, morbid obesity, OSA on CPAP, after patient went on GLP-1 agonist, she has lost weight and repeat sleep study revealed no significant sleep apnea hence presently not on CPAP.   Discussed the use of AI scribe software for clinical note transcription with the patient, who gave verbal consent to proceed.  History of Present Illness [name] is a 58 year old female with atrial fibrillation who presents with concerns about blood thinner use. She is concerned about the risks of blood thinner use, particularly after experiencing bruising from minor trauma. In March 2025, she sustained a head injury from a fall, requiring hospital evaluation to rule out intracranial bleeding. She recalls a previous hospital visit for a head injury while on blood thinners. She is interested in exploring alternative management options due to bleeding risks.  Atrial fibrillation was last documented in 2018. She has been on anticoagulation therapy since then to prevent stroke. She is currently taking flecainide  to manage her atrial fibrillation and is cautious about new medications or supplements without consulting her healthcare provider.  She has reduced her body mass index from over 40 to 33, no longer has sleep apnea, and maintains controlled blood pressure. She has no known coronary artery disease, vascular disease, or diabetes.  Labs   External Labs:  [facility] labs 11/24/2023:  Total cholesterol  209, triglycerides 57, HDL 109, LDL 86.  BUN 13, creatinine 0.770, EGFR 71 mL.  Hb 12.9.  ROS  Review of Systems  Cardiovascular:  Negative for chest pain, dyspnea on exertion and leg swelling.   Physical Exam:   VS:  BP 111/76 (BP Location: Left Arm, Patient Position: Sitting, Cuff Size: Large)   Pulse 72   Resp 16   Ht 5' 9 (1.753 m)   Wt 227 lb (103 kg)   LMP 08/10/2005   SpO2 97%   BMI 33.52 kg/m    Wt Readings from Last 3 Encounters:  02/02/24 227 lb (103 kg)  12/12/23 240 lb 1.3 oz (108.9 kg)  11/18/22 240 lb (108.9 kg)    Physical Exam Neck:     Vascular: No carotid bruit or JVD.   Cardiovascular:     Rate and Rhythm: Normal rate and regular rhythm.     Pulses: Intact distal pulses.     Heart sounds: Normal heart sounds. No murmur heard.    No gallop.  Pulmonary:     Effort: Pulmonary effort is normal.     Breath sounds: Normal breath sounds.  Abdominal:     General: Bowel sounds are normal.     Palpations: Abdomen is soft.   Musculoskeletal:     Right lower leg: No edema.     Left lower leg: No edema.    Studies Reviewed: SABRA     EKG:    EKG Interpretation Date/Time:  Wednesday February 02 2024 11:42:39 EDT Ventricular Rate:  70 PR Interval:  190 QRS Duration:  112 QT Interval:  426 QTC Calculation: 460 R Axis:   -  28  Text Interpretation: EKG 02/02/2024: Normal sinus rhythm with a rate of 70 bpm, left anterior fascicular block.  No evidence of ischemia, normal QT interval.  No significant change from 09/17/2018, previous heart rate was 44 bpm. Confirmed by Evelena Masci, Jagadeesh ([id number]) on 02/02/2024 12:17:28 PM    Medications ordered    Meds ordered this encounter  Medications   valsartan  (DIOVAN ) 160 MG tablet    Sig: Take 1 tablet (160 mg total) by mouth daily.    Dispense:  90 tablet    Refill:  3   spironolactone  (ALDACTONE ) 25 MG tablet    Sig: Take 1 tablet (25 mg total) by mouth daily.    Dispense:  90 tablet    Refill:  3   metoprolol   succinate (TOPROL -XL) 50 MG 24 hr tablet    Sig: Take 1 tablet (50 mg total) by mouth daily. Take with or immediately following a meal.    Dispense:  90 tablet    Refill:  3   flecainide  (TAMBOCOR ) 150 MG tablet    Sig: Take 1 tablet (150 mg total) by mouth 2 (two) times daily.    Dispense:  180 tablet    Refill:  3   diltiazem  (CARDIZEM  CD) 180 MG 24 hr capsule    Sig: Take 1 capsule (180 mg total) by mouth daily.    Dispense:  90 capsule    Refill:  3   apixaban  (ELIQUIS ) 5 MG TABS tablet    Sig: Take 1 tablet (5 mg total) by mouth 2 (two) times daily.    Dispense:  180 tablet    Refill:  3     ASSESSMENT AND PLAN: .      ICD-10-CM   1. Paroxysmal atrial fibrillation (HCC)  I48.0 EKG 12-Lead    Ambulatory referral to Cardiac Electrophysiology    metoprolol  succinate (TOPROL -XL) 50 MG 24 hr tablet    flecainide  (TAMBOCOR ) 150 MG tablet    apixaban  (ELIQUIS ) 5 MG TABS tablet    2. Essential hypertension  I10 valsartan  (DIOVAN ) 160 MG tablet    spironolactone  (ALDACTONE ) 25 MG tablet     Click Here to Calculate/Change CHADS2VASc Score The patient's CHADS2-VASc score is 2, indicating a 2.2% annual risk of stroke.  Therefore, anticoagulation is recommended.   CHF History: No HTN History: Yes Diabetes History: No Stroke History: No Vascular Disease History: No  Assessment & Plan Atrial Fibrillation Atrial fibrillation with last known episode in 2018 and no recurrences since. Significant weight loss has reduced BMI from over 40 to 33, resolving sleep apnea. Hypertension is well-controlled. CHADS-VASc score is 2, indicating moderate risk. She wishes to discontinue anticoagulation due to bleeding risk and inconvenience. Discussed potential loop recorder implantation to monitor for asymptomatic AFib. If no episodes are detected, discontinuation of anticoagulation and flecainide  may be considered. Explained loop recorder as a subcutaneous device for heart rhythm monitoring to assess  medication safety. - Refer to electrophysiologist for loop recorder evaluation. - Continue anticoagulation and flecainide  until further evaluation. - Monitor weight with goal BMI of 30 or less. - Discuss potential discontinuation of anticoagulation and flecainide  with electrophysiologist based on loop recorder findings.  Obesity Obesity with significant weight loss, reducing BMI from over 40 to 33. Further weight loss to a BMI of 30 or less is encouraged to help prevent AFib recurrence. - Encourage continued weight loss to achieve BMI of 30 or less.  Resolved Sleep Apnea Sleep apnea resolved following significant weight loss. No current symptoms reported.  Primary hypertension Excellent control of blood pressure with metoprolol  succinate 50 mg daily, Aldactone  25 mg daily, valsartan  160 mg daily.  Continue same.  She filled her prescriptions, we will request her PCP to take over the prescriptions.  Unless EP consultation obtained and decided to continue with flecainide  and anticoagulation, I will see her back on a as needed basis.  Patient already will be loop implantation, discontinue flecainide  if no A-fib over 3 months monitoring, and if no further A-fib with additional 69-month monitoring, discontinue anticoagulation and continue loop recorder monitoring for the next 2 to 3 years and if no further recurrence, as needed visits with cardiology.  Overall CHA2DS2-VASc score is low, prior CT scans of abdomen did not reveal any abdominal aortic atherosclerosis as well.  She is nondiabetic.  This was a complex 40-minute office visit encounter.  Signed,  Gordy Bergamo, MD, [facility] 02/02/2024, 8:56 PM [facility] [address] [place], [place] [address] Phone: [phone]. Fax:  [phone]

## 2024-02-15 ENCOUNTER — Other Ambulatory Visit: Payer: Self-pay | Admitting: Orthopaedic Surgery

## 2024-02-15 ENCOUNTER — Encounter: Payer: Self-pay | Admitting: Cardiology

## 2024-02-15 ENCOUNTER — Telehealth: Payer: Self-pay | Admitting: *Deleted

## 2024-02-15 ENCOUNTER — Encounter: Payer: Self-pay | Admitting: Orthopaedic Surgery

## 2024-02-15 DIAGNOSIS — G8929 Other chronic pain: Secondary | ICD-10-CM

## 2024-02-15 NOTE — Telephone Encounter (Signed)
   Pre-operative Risk Assessment    Patient Name: [name]  DOB: 1966-01-19 MRN: [id number]   Date of last office visit: 02/02/24 DR. LADONA Date of next office visit: 03/14/24 DR. PARKER CONSULT -LOOP RECORDER   Request for Surgical Clearance    Procedure:  RIGHT REVERSE TOTAL SHOULDER ARTHROPLASTY  Date of Surgery:  Clearance 03/14/24                                Surgeon:  DR. BONNER HAIR Surgeon's Group or Practice Name:  [facility] Phone number:  [phone] EXT [address] Fax number:  [phone]   Type of Clearance Requested:   - Medical  - Pharmacy:  Hold Apixaban  (Eliquis )     Type of Anesthesia:  General  WITH INTERSCALENE BLOCK   Additional requests/questions:    Bonney Niels Jest   02/15/2024, 10:11 AM

## 2024-02-18 ENCOUNTER — Ambulatory Visit
Admission: RE | Admit: 2024-02-18 | Discharge: 2024-02-18 | Disposition: A | Discharge status: Home / Self Care | Source: Ambulatory Visit | Attending: Orthopaedic Surgery

## 2024-02-18 DIAGNOSIS — N951 Menopausal and female climacteric states: Secondary | ICD-10-CM | POA: Insufficient documentation

## 2024-02-18 DIAGNOSIS — E559 Vitamin D deficiency, unspecified: Secondary | ICD-10-CM | POA: Insufficient documentation

## 2024-02-18 DIAGNOSIS — E669 Obesity, unspecified: Secondary | ICD-10-CM | POA: Insufficient documentation

## 2024-02-18 DIAGNOSIS — G8929 Other chronic pain: Secondary | ICD-10-CM

## 2024-02-18 NOTE — Telephone Encounter (Signed)
 Patient with diagnosis of A Fib on Eliquis  for anticoagulation.    Procedure: RIGHT REVERSE TOTAL SHOULDER ARTHROPLASTY  Date of procedure: 03/14/24   CHA2DS2-VASc Score = 2  This indicates a 2.2% annual risk of stroke. The patient's score is based upon: CHF History: 0 HTN History: 1 Diabetes History: 0 Stroke History: 0 Vascular Disease History: 0 Age Score: 0 Gender Score: 1       CrCl 93 ml/min Platelet count 349K   Per office protocol, patient can hold Eliquis  for 3 days prior to procedure.    Patient will not need bridging with Lovenox  (enoxaparin ) around procedure.  **This guidance is not considered finalized until pre-operative APP has relayed final recommendations.**

## 2024-03-01 ENCOUNTER — Ambulatory Visit: Attending: Cardiology

## 2024-03-01 ENCOUNTER — Telehealth: Payer: Self-pay

## 2024-03-01 DIAGNOSIS — I480 Paroxysmal atrial fibrillation: Secondary | ICD-10-CM

## 2024-03-01 NOTE — Telephone Encounter (Signed)
 Spoke with the patient. Heart monitor has been ordered. Patient's appointment with EP has been rescheduled.

## 2024-03-01 NOTE — Telephone Encounter (Signed)
-----   Message from Gordy Bergamo sent at 03/01/2024  8:01 AM EDT ----- Regarding: RE: 08/05 ILR denial I had already approved it. Not live. 2 weeks Zio ----- Message ----- From: Chauvigne, Anberlin Diez, RN Sent: 03/01/2024   8:00 AM EDT To: Gordy Bergamo, MD Subject: FW: 08/05 ILR denial                           Hey Dr. Bergamo,   This is a patient that you referred over to EP to have a loop recorder placed to monitor for afib and potentially come off of anticoagulation and flecainide . Her insurance denied it due to wanting her to wear a heart monitor first. Would you be okay with ordering a heart monitor and I will get her an appointment after those results come through?   Thanks! Carly ----- Message ----- From: Shona Charlott HERO Sent: 02/28/2024  12:59 PM EDT To: Lucienne HERO Seferino Doreatha Magdaline, RN Subject: RE: 08/05 ILR denial                           Thanks for the heads up [place].  Carly - so just leave as consult with Dr. Kennyth? ----- Message ----- From: Eveline Lucienne HERO Sent: 02/28/2024   7:25 AM EDT To: Charlott HERO Shona; Deeanna GORMAN Frees; Leah Newna# Subject: 08/05 ILR denial                               Good morning,  Insurance denied this request due to it not being medically necessary. They are wanting monitor results first. They did offer a peer to peer but this has to be scheduled by 7/28.  Thank you, ----- Message ----- From: Vandy Palma Sent: 02/04/2024  11:51 AM EDT To: Lucienne HERO Eveline Subject: 08/05 ILR                                       ----- Message ----- From: Frees Deeanna GORMAN Sent: 02/03/2024   4:20 PM EDT To: Charlott HERO Shona; Leah Newnam; Mirella Gueye Chauvign# Subject: ILR consult                                    Pt is scheduled with Dr. Kennyth on 03/14/24 at 12:00 pm for ILR consult/implant

## 2024-03-01 NOTE — Progress Notes (Unsigned)
 Enrolled patient for a 14 day Zio XT  monitor to be mailed to patients home

## 2024-03-04 ENCOUNTER — Other Ambulatory Visit: Payer: Self-pay | Admitting: Cardiology

## 2024-03-04 DIAGNOSIS — I480 Paroxysmal atrial fibrillation: Secondary | ICD-10-CM

## 2024-03-07 ENCOUNTER — Other Ambulatory Visit (HOSPITAL_COMMUNITY): Payer: Self-pay

## 2024-03-09 ENCOUNTER — Encounter: Payer: Self-pay | Admitting: Cardiology

## 2024-03-09 NOTE — Telephone Encounter (Signed)
 Dr. Ladona,  Ms. [name] is requesting preoperative cardiac evaluation for right reverse total shoulder arthroplasty.  She was seen by you in clinic on 02/02/2024.  She was doing well at that time from a cardiac standpoint.  She was referred to EP for loop recorder evaluation.  Would you be able to provide recommendations on cardiac risk for upcoming procedure?  Thank you for your help.  Please direct your response to CVD IV preop pool.  [name]. Dallin Mccorkel NP-C     03/09/2024, 11:39 AM [facility] [address] Office [phone] Fax [phone]

## 2024-03-09 NOTE — Telephone Encounter (Signed)
 Preop evaluation sent, please see letter section

## 2024-03-14 ENCOUNTER — Institutional Professional Consult (permissible substitution): Admitting: Cardiology

## 2024-03-14 NOTE — Telephone Encounter (Signed)
 Dr. Ladona sent letter to preoperative office, will remove request from preoperative pool.

## 2024-04-03 ENCOUNTER — Ambulatory Visit: Payer: Self-pay | Admitting: Cardiology

## 2024-04-03 DIAGNOSIS — I480 Paroxysmal atrial fibrillation: Secondary | ICD-10-CM

## 2024-04-03 NOTE — Progress Notes (Signed)
 No A. Fib. Just FYI. Patient with remote AF and now wants to be off anticoagulation. Consideration for loop

## 2024-04-10 NOTE — Progress Notes (Deleted)
  Electrophysiology Office Note:   Date:  04/10/2024  ID:  [name], DOB 1966/02/09, MRN [id number]  Primary Cardiologist: Gordy Bergamo, MD Electrophysiologist: None  {Click to update primary MD,subspecialty MD or APP then REFRESH:1}    History of Present Illness:   [name] is a 58 y.o. female with h/o morbid obesity, history of gastric banding in 2011, due to failure of the same, underwent gastric bypass surgery in Sept 2018, paroxysmal atrial fibrillation no known recurrence since initiation of flecainide  and presently on Eliquis , hypertension, morbid obesity, OSA on CPAP,  seen today for evaluation for loop recorder at the request of Dr. Bergamo.   Discussed the use of AI scribe software for clinical note transcription with the patient, who gave verbal consent to proceed.  History of Present Illness     Review of systems complete and found to be negative unless listed in HPI.   EP Information / Studies Reviewed:    {EKGtoday:28818}      ***  Risk Assessment/Calculations:    CHA2DS2-VASc Score = 2  {Confirm score is correct.  If not, click here to update score.  REFRESH note.  :1} This indicates a 2.2% annual risk of stroke. The patient's score is based upon: CHF History: 0 HTN History: 1 Diabetes History: 0 Stroke History: 0 Vascular Disease History: 0 Age Score: 0 Gender Score: 1     No BP recorded.  {Refresh Note OR Click here to enter BP  :1}***        Physical Exam:   VS:  LMP 08/10/2005    Wt Readings from Last 3 Encounters:  02/02/24 227 lb (103 kg)  12/12/23 240 lb 1.3 oz (108.9 kg)  11/18/22 240 lb (108.9 kg)     GEN: Well nourished, well developed in no acute distress NECK: No JVD CARDIAC: {EPRHYTHM:28826}, no murmurs, rubs, gallops RESPIRATORY:  Clear to auscultation without rales, wheezing or rhonchi  ABDOMEN: Soft, non-distended EXTREMITIES:  No edema; No deformity   ASSESSMENT AND PLAN:    #Paroxysmal AF: Last known episode in 2018.   #Hypercoagulable state due to AF: Patient wants to discontinue anti-coagulation. CHADSVASC score of 2. - ILR and stop flecainide  then DOAC if maintaining sinus.   #Hypertension *** goal today.  Recommend checking blood pressures 1-2 times per week at home and recording the values.  Recommend bringing these recordings to the primary care physician. Assessment & Plan       Follow up with {EPMDS:28135::EP Team} {EPFOLLOW LE:71826}  Signed, Fonda Kitty, MD

## 2024-04-11 ENCOUNTER — Ambulatory Visit: Admitting: Cardiology

## 2024-04-11 DIAGNOSIS — I10 Essential (primary) hypertension: Secondary | ICD-10-CM

## 2024-04-11 DIAGNOSIS — I480 Paroxysmal atrial fibrillation: Secondary | ICD-10-CM

## 2024-04-18 NOTE — Progress Notes (Deleted)
  Electrophysiology Office Note:   Date:  04/18/2024  ID:  [name], DOB 1965/11/01, MRN [id number]  Primary Cardiologist: Gordy Bergamo, MD Electrophysiologist: None  {Click to update primary MD,subspecialty MD or APP then REFRESH:1}    History of Present Illness:   [name] is a 58 y.o. female with h/o morbid obesity, history of gastric banding in 2011, due to failure of the same, underwent gastric bypass surgery in Sept 2018, paroxysmal atrial fibrillation no known recurrence since initiation of flecainide  and presently on Eliquis , hypertension, morbid obesity, OSA on CPAP,  seen today for evaluation for loop recorder at the request of Dr. Bergamo.   Discussed the use of AI scribe software for clinical note transcription with the patient, who gave verbal consent to proceed.  History of Present Illness     Review of systems complete and found to be negative unless listed in HPI.   EP Information / Studies Reviewed:    {EKGtoday:28818}      ***  Risk Assessment/Calculations:    CHA2DS2-VASc Score = 2  {Confirm score is correct.  If not, click here to update score.  REFRESH note.  :1} This indicates a 2.2% annual risk of stroke. The patient's score is based upon: CHF History: 0 HTN History: 1 Diabetes History: 0 Stroke History: 0 Vascular Disease History: 0 Age Score: 0 Gender Score: 1     No BP recorded.  {Refresh Note OR Click here to enter BP  :1}***        Physical Exam:   VS:  LMP 08/10/2005    Wt Readings from Last 3 Encounters:  02/02/24 227 lb (103 kg)  12/12/23 240 lb 1.3 oz (108.9 kg)  11/18/22 240 lb (108.9 kg)     GEN: Well nourished, well developed in no acute distress NECK: No JVD CARDIAC: {EPRHYTHM:28826}, no murmurs, rubs, gallops RESPIRATORY:  Clear to auscultation without rales, wheezing or rhonchi  ABDOMEN: Soft, non-distended EXTREMITIES:  No edema; No deformity   ASSESSMENT AND PLAN:    #Paroxysmal AF: Last known episode in 2018.   #Hypercoagulable state due to AF: Patient wants to discontinue anti-coagulation. CHADSVASC score of 2. - ILR and stop flecainide  then DOAC if maintaining sinus.   #Hypertension *** goal today.  Recommend checking blood pressures 1-2 times per week at home and recording the values.  Recommend bringing these recordings to the primary care physician. Assessment & Plan       Follow up with {EPMDS:28135::EP Team} {EPFOLLOW LE:71826}  Signed, Fonda Kitty, MD

## 2024-04-21 ENCOUNTER — Ambulatory Visit: Admitting: Cardiology

## 2024-05-01 ENCOUNTER — Other Ambulatory Visit: Payer: Self-pay | Admitting: Cardiology

## 2024-05-01 DIAGNOSIS — I480 Paroxysmal atrial fibrillation: Secondary | ICD-10-CM

## 2024-06-05 NOTE — Progress Notes (Signed)
 " Electrophysiology Office Note:   Date:  06/09/2024  ID:  [name], DOB [birth year], MRN [id number]  Primary Cardiologist: Gordy Bergamo, MD Electrophysiologist: Fonda Kitty, MD      History of Present Illness:   [name] is a 58 y.o. female with h/o history of gastric banding in 2011, gastric bypass surgery in 05/25/2017, paroxysmal atrial fibrillation no known recurrence since initiation of flecainide  and presently on Eliquis , hypertension who is being seen today for loop recorder implant at the request of Dr. Bergamo..  Discussed the use of AI scribe software for clinical note transcription with the patient, who gave verbal consent to proceed.  History of Present Illness [name] is a 58 year old female with atrial fibrillation who presents for discussion about a loop recorder. She was referred by Dr. Bergamo for evaluation regarding a loop recorder.  She has a history of atrial fibrillation and is currently on Eliquis . She is concerned about the long-term use of Eliquis  due to previous trauma-related incidents, including a head injury that required monitoring for potential brain bleeding, and frequent bruising and cuts. She is worried about the implications of being on blood thinners, especially as she plans to travel extensively after retiring at the end of the year.  Dr. Bergamo discussed options with the patient and she opted for loop recorder implantation for monitoring and discontinuation of anticoagulation if no recurrence of AF.  Her brother, who also had atrial fibrillation, passed away in 2024-05-25, although his death was not related to atrial fibrillation.  She is currently taking flecainide .    Review of systems complete and found to be negative unless listed in HPI.   EP Information / Studies Reviewed:    EKG is ordered today. Personal review as below.  EKG Interpretation Date/Time:  Tuesday June 06 2024 12:16:02 EDT Ventricular Rate:  64 PR  Interval:  182 QRS Duration:  112 QT Interval:  446 QTC Calculation: 460 R Axis:   55  Text Interpretation: Normal sinus rhythm Normal ECG When compared with ECG of 02-Feb-2024 11:42, QRS axis Shifted right Confirmed by Kitty Fonda [id number]) on 06/09/2024 12:35:45 PM   Zio 02/2024:    Risk Assessment/Calculations:    CHA2DS2-VASc Score = 2   This indicates a 2.2% annual risk of stroke. The patient's score is based upon: CHF History: 0 HTN History: 1 Diabetes History: 0 Stroke History: 0 Vascular Disease History: 0 Age Score: 0 Gender Score: 1      Physical Exam:   VS:  BP 116/79 (BP Location: Right Arm, Patient Position: Sitting, Cuff Size: Large)   Pulse 64   Ht 5' 9 (1.753 m)   Wt 232 lb (105.2 kg)   LMP 08/10/2005   SpO2 98%   BMI 34.26 kg/m    Wt Readings from Last 3 Encounters:  06/06/24 232 lb (105.2 kg)  02/02/24 227 lb (103 kg)  12/12/23 240 lb 1.3 oz (108.9 kg)     GEN: Well nourished, well developed in no acute distress NECK: No JVD CARDIAC: Normal rate and regular rhythm. RESPIRATORY:  Clear to auscultation without rales, wheezing or rhonchi  ABDOMEN: Soft, non-distended EXTREMITIES:  No edema; No deformity   ASSESSMENT AND PLAN:    #Paroxysmal atrial fibrillation: #High risk medication use: Flecainide .  Normal PR and QRS intervals. #Hypercoagulable state due to AF: CHA2DS2-VASc score of only 2, with 1 point being for female gender. -Patient discussed options for discontinuation of anticoagulation with Dr. Bergamo previously.  They agreed upon loop recorder implant and stopping anticoagulation if no AF present.  Risk and benefits of loop recorder implantation have been discussed.  Patient voiced understanding and elected to proceed with implant today.   -We will stop flecainide  today and monitor for recurrence of AF with loop recorder. -If she has no recurrence of atrial fibrillation after 3 months of monitoring, then she will stop  anticoagulation.  SURGEON:  Fonda Kitty, MD     PREPROCEDURE DIAGNOSIS:  Atrial fibrillation    POSTPROCEDURE DIAGNOSIS: Atrial fibrillation     PROCEDURES:   1. Implantable loop recorder implantation    INTRODUCTION:  [name] presents with a history of atrial fibrillation The costs of loop recorder monitoring have been discussed with the patient.    DESCRIPTION OF PROCEDURE:  Informed written consent was obtained.   Time Out Completed with RN    The patient required no sedation for the procedure today. The patients left chest was therefore prepped and draped in the usual sterile fashion. The skin overlying the left parasternal region was infiltrated with lidocaine  for local analgesia.  A 0.5-cm incision was made over the left parasternal region.  The pocket was oriented more vertically and immediately adjacent to the sternum in effort to avoid significant breast tissue.  A subcutaneous ILR pocket was fashioned using a combination of sharp and blunt dissection.  A Medtronic Reveal LINQ 2 implantable loop recorder (serial # [id number]) was then placed into the pocket  R waves were very prominent and measuring >0.3mV.  Steri- Strips and a sterile dressing were then applied.  There were no early apparent complications.     CONCLUSIONS:   1. Successful implantation of a implantable loop recorder for a history of atrial fibrillation.  2. No early apparent complications.   Follow up regularly with Dr. Ladona and EP team as needed.  Signed, Fonda Kitty, MD  "

## 2024-06-06 ENCOUNTER — Encounter: Payer: Self-pay | Admitting: Cardiology

## 2024-06-06 ENCOUNTER — Ambulatory Visit: Payer: Self-pay | Attending: Cardiology | Admitting: Cardiology

## 2024-06-06 VITALS — BP 116/79 | HR 64 | Ht 69.0 in | Wt 232.0 lb

## 2024-06-06 DIAGNOSIS — Z79899 Other long term (current) drug therapy: Secondary | ICD-10-CM

## 2024-06-06 DIAGNOSIS — I480 Paroxysmal atrial fibrillation: Secondary | ICD-10-CM | POA: Diagnosis not present

## 2024-06-06 DIAGNOSIS — D6869 Other thrombophilia: Secondary | ICD-10-CM

## 2024-06-06 NOTE — Patient Instructions (Signed)
 Medication Instructions:  Your physician has recommended you make the following change in your medication:  1) STOP taking flecainide   Labwork: None ordered.  Testing/Procedures: None ordered.  Follow-Up:  Your physician wants you to follow-up in: two months with Dr. Kennyth    Implantable Loop Recorder Placement, Care After This sheet gives you information about how to care for yourself after your procedure. Your health care provider may also give you more specific instructions. If you have problems or questions, contact your health care provider. What can I expect after the procedure? After the procedure, it is common to have: Soreness or discomfort near the incision. Some swelling or bruising near the incision.  Follow these instructions at home: Incision care  Monitor your cardiac device site for redness, swelling, and drainage. Call the device clinic at [phone] if you experience these symptoms or fever/chills.  Keep the large square bandage on your site for 24 hours and then you may remove it yourself. Keep the steri-strips underneath in place.   You may shower after 72 hours / 3 days from your procedure with the steri-strips in place. They will usually fall off on their own, or may be removed after 10 days. Pat dry.   Avoid lotions, ointments, or perfumes over your incision until it is well-healed.  Please do not submerge in water  until your site is completely healed.   Your device is MRI compatible.   Remote monitoring is used to monitor your cardiac device from home. This monitoring is scheduled every month by our office. It allows us  to keep an eye on the function of your device to ensure it is working properly.  If your wound site starts to bleed apply pressure.    For help with the monitor please call Med[employer] Monitor Support Specialist directly at [phone].    If you have any questions/concerns please call the device clinic at  [phone].  Activity  Return to your normal activities.  General instructions Follow instructions from your health care provider about how to manage your implantable loop recorder and transmit the information. Learn how to activate a recording if this is necessary for your type of device. You may go through a metal detection gate, and you may let someone hold a metal detector over your chest. Show your ID card if needed. Do not have an MRI unless you check with your health care provider first. Take over-the-counter and prescription medicines only as told by your health care provider. Keep all follow-up visits as told by your health care provider. This is important. Contact a health care provider if: You have redness, swelling, or pain around your incision. You have a fever. You have pain that is not relieved by your pain medicine. You have triggered your device because of fainting (syncope) or because of a heartbeat that feels like it is racing, slow, fluttering, or skipping (palpitations). Get help right away if you have: Chest pain. Difficulty breathing. Summary After the procedure, it is common to have soreness or discomfort near the incision. Change your dressing as told by your health care provider. Follow instructions from your health care provider about how to manage your implantable loop recorder and transmit the information. Keep all follow-up visits as told by your health care provider. This is important. This information is not intended to replace advice given to you by your health care provider. Make sure you discuss any questions you have with your health care provider. Document Released: 07/08/2015 Document Revised: 09/11/2017 Document Reviewed:  09/11/2017 Elsevier Patient Education  2020 [employer].

## 2024-06-16 ENCOUNTER — Telehealth: Payer: Self-pay

## 2024-06-16 NOTE — Telephone Encounter (Signed)
 Alert received:  Alert remote transmission: AF AF/AFL in progress from 11/2, controlled rates, Eliquis  per EPIC - route to triage   Sending to Dr. Kennyth as RICK.  Discontinued flecainide  06/06/2024.  Will continue to monitor.

## 2024-06-19 NOTE — Telephone Encounter (Signed)
 Spoke with patient.   States she can tell something isn't right.  Has been feeling heart race, dizzy, lightheaded almost like she could faint.  She is still taking her Eliquis  and has not missed any doses.  Almost went to urgent care today.   Given symptoms and need for medication management: referred to Fairy Heinrich, PA-C in our Afib clinic: appt tomorrow, 06/20/24 at 3:30pm.

## 2024-06-20 ENCOUNTER — Encounter (HOSPITAL_COMMUNITY): Payer: Self-pay | Admitting: Internal Medicine

## 2024-06-20 ENCOUNTER — Ambulatory Visit (HOSPITAL_COMMUNITY)
Admission: RE | Admit: 2024-06-20 | Discharge: 2024-06-20 | Disposition: A | Discharge status: Home / Self Care | Source: Ambulatory Visit | Attending: Internal Medicine | Admitting: Internal Medicine

## 2024-06-20 VITALS — BP 118/80 | HR 73 | Ht 69.0 in | Wt 234.0 lb

## 2024-06-20 DIAGNOSIS — I480 Paroxysmal atrial fibrillation: Secondary | ICD-10-CM | POA: Diagnosis not present

## 2024-06-20 NOTE — Progress Notes (Signed)
 Primary Care Physician: Waylan Almarie SAUNDERS, MD Primary Cardiologist: Gordy Bergamo, MD Electrophysiologist: Fonda Kitty, MD     Referring Physician: Dr. [name] is a 58 y.o. female with a history of HTN, gastric banding in 2011, gastric bypass surgery in September 2018, and paroxysmal atrial fibrillation who presents for consultation in the [facility]. Seen by Dr. Kitty on 06/06/24 and flecainide  150 mg BID was stopped with ILR implantation and plan to stop OAC after 3 months of monitoring. Device clinic alert on 11/7 for Afib in progress from 11/2 with controlled HR. Patient is on Eliquis  for stroke prevention.  On evaluation today, patient is currently in NSR. Review of ILR shows several Afib episodes beginning the day after flecainide  discontinuation, with Afib episode last updated on 11/10. Patient is on the verge of retirement and has had increased caffeine intake and alcohol intake in terms of finishing work projects and celebratory outings.   Today, she denies symptoms of chest pain, shortness of breath, orthopnea, PND, lower extremity edema, dizziness, presyncope, syncope, bleeding, or neurologic sequela. The patient is tolerating medications without difficulties and is otherwise without complaint today.    she has a BMI of Body mass index is 34.56 kg/m.[name] Filed Weights   06/20/24 1529  Weight: 106.1 kg    Current Outpatient Medications  Medication Sig Dispense Refill   ALPRAZolam  (XANAX ) 0.5 MG tablet SMARTSIG:1-2 Tablet(s) By Mouth 1 to 2 Times Daily PRN     apixaban  (ELIQUIS ) 5 MG TABS tablet Take 1 tablet (5 mg total) by mouth 2 (two) times daily. 180 tablet 3   celecoxib (CELEBREX) 100 MG capsule Take 100 mg by mouth daily.     diltiazem  (CARDIZEM  CD) 180 MG 24 hr capsule Take 1 capsule (180 mg total) by mouth daily. 90 capsule 3   escitalopram  (LEXAPRO ) 20 MG tablet Take 20 mg by mouth daily.     Melatonin 10 MG CAPS Take 1  capsule by mouth daily.     metoprolol  succinate (TOPROL -XL) 50 MG 24 hr tablet TAKE 1 TABLET DAILY, WITH  OR IMMEDIATELY FOLLOWING A MEAL 90 tablet 2   Multiple Vitamins-Minerals (MULTIVITAMIN WITH MINERALS) tablet Take 1 tablet by mouth daily.     spironolactone  (ALDACTONE ) 25 MG tablet Take 1 tablet (25 mg total) by mouth daily. 90 tablet 3   traZODone  (DESYREL ) 50 MG tablet Take 100 mg by mouth at bedtime as needed.     valsartan  (DIOVAN ) 160 MG tablet Take 1 tablet (160 mg total) by mouth daily. 90 tablet 3   WEGOVY  1 MG/0.5ML SOAJ SQ injection Inject 1 mg into the skin.     No current facility-administered medications for this encounter.    Atrial Fibrillation Management history:  Previous antiarrhythmic drugs: flecainide  Previous cardioversions: none Previous ablations: none Anticoagulation history: Eliquis    ROS- All systems are reviewed and negative except as per the HPI above.  Physical Exam: BP 118/80   Pulse 73   Ht 5' 9 (1.753 m)   Wt 106.1 kg   LMP 08/10/2005   BMI 34.56 kg/m   GEN: Well nourished, well developed in no acute distress NECK: No JVD; No carotid bruits CARDIAC: Regular rate and rhythm, no murmurs, rubs, gallops RESPIRATORY:  Clear to auscultation without rales, wheezing or rhonchi  ABDOMEN: Soft, non-tender, non-distended EXTREMITIES:  No edema; No deformity   EKG today demonstrates  Vent. rate 73 BPM PR interval 148 ms QRS duration  86 ms QT/QTcB 420/462 ms P-R-T axes 69 4 48 Normal sinus rhythm Possible Left atrial enlargement Borderline ECG When compared with ECG of 06-Jun-2024 12:16, Previous ECG is present  ASSESSMENT & PLAN CHA2DS2-VASc Score = 2  The patient's score is based upon: CHF History: 0 HTN History: 1 Diabetes History: 0 Stroke History: 0 Vascular Disease History: 0 Age Score: 0 Gender Score: 1       ASSESSMENT AND PLAN: Paroxysmal Atrial Fibrillation (ICD10:  I48.0) The patient's CHA2DS2-VASc score is 2,  indicating a 2.2% annual risk of stroke.    Patient is currently in NSR.  As noted above, patient admits to increase in potential triggers for A-fib episodes related to her upcoming retirement.  Patient defers rhythm control for now and wishes to pursue conservative observation instead of restarting flecainide .  We will utilize her loop recorder to reassess her A-fib burden in 1 month.  I will notify patient of her A-fib burden in comparison to current episodes.  She is willing to restart flecainide  if has notable increase in burden that is ongoing.  For now, continue with current regimen and she has not stopped Eliquis .  Continue Eliquis  5 mg twice daily.     Follow up Afib clinic prn.   [facility], [facility]  Afib Clinic [address] [place], [place] [address] [phone]

## 2024-06-26 ENCOUNTER — Telehealth: Payer: Self-pay | Admitting: Cardiology

## 2024-06-26 NOTE — Telephone Encounter (Signed)
  1. Has your device fired? no  2. Is you device beeping? no  3. Are you experiencing draining or swelling at device site? no  4. Are you calling to see if we received your device transmission? no 5. Have you passed out? No  PT has new phone and needs to set up for device. Requesting password.    Please route to Device Clinic Pool

## 2024-07-03 ENCOUNTER — Telehealth: Payer: Self-pay | Admitting: *Deleted

## 2024-07-03 NOTE — Telephone Encounter (Signed)
 ILR implanted for AF management:  Alert remote transmission:  AT/AF daily burden > threshold AF in progress from 11/23, controlled rates, burden 95.9%, Eliquis  per EPIC - route to triage Follow up as scheduled. LA, CVRS _______________________________________________________________________  Patient is being followed by Fairy Heinrich, PA-C in AF clinic.  Forwarding to his team for follow up.

## 2024-07-06 LAB — CUP PACEART REMOTE DEVICE CHECK
Date Time Interrogation Session: 20251127233932
Implantable Pulse Generator Implant Date: 20251028

## 2024-07-07 ENCOUNTER — Ambulatory Visit

## 2024-07-07 ENCOUNTER — Ambulatory Visit: Attending: Cardiology

## 2024-07-07 DIAGNOSIS — I480 Paroxysmal atrial fibrillation: Secondary | ICD-10-CM

## 2024-07-12 NOTE — Progress Notes (Signed)
 Remote Loop Recorder Transmission

## 2024-08-06 ENCOUNTER — Ambulatory Visit: Payer: Self-pay | Admitting: Cardiology

## 2024-08-07 ENCOUNTER — Ambulatory Visit: Attending: Cardiology

## 2024-08-07 DIAGNOSIS — I480 Paroxysmal atrial fibrillation: Secondary | ICD-10-CM

## 2024-08-08 LAB — CUP PACEART REMOTE DEVICE CHECK
Date Time Interrogation Session: 20251228234931
Implantable Pulse Generator Implant Date: 20251028

## 2024-08-12 ENCOUNTER — Ambulatory Visit: Payer: Self-pay | Admitting: Cardiology

## 2024-08-15 NOTE — Progress Notes (Signed)
 Remote Loop Recorder Transmission

## 2024-09-07 ENCOUNTER — Ambulatory Visit

## 2024-09-07 DIAGNOSIS — I480 Paroxysmal atrial fibrillation: Secondary | ICD-10-CM

## 2024-09-07 LAB — CUP PACEART REMOTE DEVICE CHECK
Date Time Interrogation Session: 20260128233844
Implantable Pulse Generator Implant Date: 20251028

## 2024-09-15 NOTE — Progress Notes (Signed)
 Remote Loop Recorder Transmission

## 2024-10-08 ENCOUNTER — Ambulatory Visit
